# Patient Record
Sex: Female | Born: 1975
Health system: Southern US, Community
[De-identification: ages and names within clinical notes are randomized; demographics above are authoritative.]

## PROBLEM LIST (undated history)

## (undated) DIAGNOSIS — I82401 Acute embolism and thrombosis of unspecified deep veins of right lower extremity: Secondary | ICD-10-CM

## (undated) DIAGNOSIS — Z21 Asymptomatic human immunodeficiency virus [HIV] infection status: Secondary | ICD-10-CM

## (undated) DIAGNOSIS — I2699 Other pulmonary embolism without acute cor pulmonale: Secondary | ICD-10-CM

## (undated) DIAGNOSIS — Z8489 Family history of other specified conditions: Secondary | ICD-10-CM

## (undated) DIAGNOSIS — I10 Essential (primary) hypertension: Secondary | ICD-10-CM

## (undated) DIAGNOSIS — B20 Human immunodeficiency virus [HIV] disease: Secondary | ICD-10-CM

## (undated) DIAGNOSIS — A310 Pulmonary mycobacterial infection: Secondary | ICD-10-CM

## (undated) DIAGNOSIS — T4145XA Adverse effect of unspecified anesthetic, initial encounter: Secondary | ICD-10-CM

## (undated) DIAGNOSIS — S8261XA Displaced fracture of lateral malleolus of right fibula, initial encounter for closed fracture: Secondary | ICD-10-CM

## (undated) DIAGNOSIS — B3781 Candidal esophagitis: Secondary | ICD-10-CM

## (undated) DIAGNOSIS — F419 Anxiety disorder, unspecified: Secondary | ICD-10-CM

## (undated) DIAGNOSIS — M1990 Unspecified osteoarthritis, unspecified site: Secondary | ICD-10-CM

## (undated) HISTORY — DX: Candidal esophagitis: B37.81

## (undated) HISTORY — DX: Anxiety disorder, unspecified: F41.9

## (undated) HISTORY — PX: OTHER SURGICAL HISTORY: SHX169

## (undated) HISTORY — PX: ANTERIOR CRUCIATE LIGAMENT REPAIR: SHX115

## (undated) HISTORY — DX: Pulmonary mycobacterial infection: A31.0

## (undated) HISTORY — PX: TONSILLECTOMY: SUR1361

## (undated) HISTORY — DX: Asymptomatic human immunodeficiency virus (hiv) infection status: Z21

## (undated) HISTORY — DX: Human immunodeficiency virus (HIV) disease: B20

---
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
## 1997-10-07 ENCOUNTER — Other Ambulatory Visit: Admission: RE | Admit: 1997-10-07 | Discharge: 1997-10-07 | Payer: Self-pay | Admitting: Obstetrics and Gynecology

## 2000-11-16 ENCOUNTER — Encounter (INDEPENDENT_AMBULATORY_CARE_PROVIDER_SITE_OTHER): Payer: Self-pay | Admitting: Specialist

## 2000-11-16 ENCOUNTER — Other Ambulatory Visit: Admission: RE | Admit: 2000-11-16 | Discharge: 2000-11-16 | Payer: Self-pay | Admitting: Otolaryngology

## 2001-05-17 ENCOUNTER — Ambulatory Visit (HOSPITAL_BASED_OUTPATIENT_CLINIC_OR_DEPARTMENT_OTHER): Admission: RE | Admit: 2001-05-17 | Discharge: 2001-05-17 | Payer: Self-pay | Admitting: Orthopedic Surgery

## 2007-08-25 ENCOUNTER — Ambulatory Visit (HOSPITAL_BASED_OUTPATIENT_CLINIC_OR_DEPARTMENT_OTHER): Admission: RE | Admit: 2007-08-25 | Discharge: 2007-08-25 | Payer: Self-pay | Admitting: Otolaryngology

## 2007-09-10 ENCOUNTER — Ambulatory Visit: Payer: Self-pay | Admitting: Internal Medicine

## 2007-09-25 ENCOUNTER — Encounter (INDEPENDENT_AMBULATORY_CARE_PROVIDER_SITE_OTHER): Payer: Self-pay | Admitting: Otolaryngology

## 2007-09-25 ENCOUNTER — Ambulatory Visit (HOSPITAL_COMMUNITY): Admission: RE | Admit: 2007-09-25 | Discharge: 2007-09-26 | Payer: Self-pay | Admitting: Otolaryngology

## 2010-08-30 NOTE — H&P (Signed)
[name], [name]              ACCOUNT NO.:  [id number]   MEDICAL RECORD NO.:  [id number]          PATIENT TYPE:  OIB   LOCATION:  3302                         FACILITY:  [facility]   PHYSICIAN:  Hermelinda Medicus, M.D.   DATE OF BIRTH:  [birth year]   DATE OF ADMISSION:  09/25/2007  DATE OF DISCHARGE:                              HISTORY & PHYSICAL   This patient is a 35 year old female who has had sleep apnea issues.  She has had a septal reconstruction done back in O2 and did some better  on her breathing, but has a very small mouth.  Had a sleep study done  more recently and has a RDI of 29.3.  She has an O2 nadir of 84%.  She  spent 21% of her time in deep sleep, REM sleep, and has a BMI of 35.  She is a normally healthy child at 35 and comes in for turbinate  reduction, uvulopalatoplasty, and tonsillectomy.   Her past history is essentially unremarkable.  She takes no medications.  Of note, takes some Tylenol Sinus as needed and some Mucinex 600-800 mg.  Bowel, bladder, kidney, and respiratory problems are absent.  Her EKG  showed no abnormalities and she now addressed for her surgery.   PHYSICAL EXAMINATION:  Vital sings:  Her blood pressure is 115/79 and  pulse 79.  HEENT:  Her ears are clear.  Tympanic membranes are clear.  Nasal shows  considerable turbinate hypertrophy with nasal blockage but the septum is  nice and straight as it was corrected in 2002.  The oral tonsillar  hypertrophy with redundant uvula and fairly narrow, shallow nasopharynx  and oropharynx.  NECK:  Free of any thyromegaly, cervical adenopathy, or mass.  Larynx is  clear.  True cords, false cords, epiglottis, and base of the tongue are  clear.  True cord mobility, gag reflex, tongue mobility, EOMs, and  facial nerve were all symmetrical.  CHEST:  Clear to A and P.  No rales, rhonchi, or wheezes.  CARDIOVASCULAR:  Normal S1, S2.  No murmurs, rubs, or gallops.   INITIAL DIAGNOSES:  Sleep apnea with  tonsillar hypertrophy with nasal  obstruction, history of septal reconstruction and turbinate reduction.           ______________________________  Hermelinda Medicus, M.D.     JC/MEDQ  D:  09/25/2007  T:  09/26/2007  Job:  [id number]   cc:   Anna Maria, MD

## 2010-08-30 NOTE — Op Note (Signed)
[name], [name]              ACCOUNT NO.:  [id number]   MEDICAL RECORD NO.:  [id number]          PATIENT TYPE:  OIB   LOCATION:  3302                         FACILITY:  [facility]   PHYSICIAN:  Hermelinda Medicus, M.D.   DATE OF BIRTH:  [birth year]   DATE OF PROCEDURE:  DATE OF DISCHARGE:                               OPERATIVE REPORT   PREOPERATIVE DIAGNOSIS:  Sleep apnea with an RDI of 28.9, BMI of 35, O2  nadir 84%, and REM of 21.3%.   POSTOPERATIVE DIAGNOSIS:  Sleep apnea with an RDI of 28.9, BMI of 35, O2  nadir 84%, and REM of 21.3%.   OPERATION:  A turbinate reduction with a tonsillectomy and  uvulopalatoplasty.   OPERATOR:  Hermelinda Medicus, MD   ANESTHESIA:  With Dr. Catricia Piper, general endotracheal.   PROCEDURE:  The patient was placed in supine position and under general  orotracheal anesthesia, the patient was prepped and draped in the  appropriate manner.  Using the usual Hibiclens and usual head drape, 1%  Xylocaine with epinephrine was used to shrink the membranes of the  turbinates and the turbinates were reduced using the butter knife for  just blunt reduction and then the ELMED was used as a bipolar cautery to  cauterize and shrink the lateral inferior aspect of the mucous  membranes.  Once this was achieved, the Telfa was placed within the nose  and then we repositioned and oriented ourselves toward the oral cavity.  The tonsillar gag, the Vernelle Emerald was then placed, and the tonsils  were removed using the sharp, blunt, and Bovie to coagulation  dissection.  Once these tonsil were removed, we gained a considerable  amount of space and she will be much able to breathe far better.  The  uvula was also approximately 3 times as normal size and we trimmed that  to its normal size.  The closure of the anterior posterior skin was with  5-0 Ethilon.  The patient was aware of the risks and gains.  She is  aware that she could have some bleeding postoperatively from her  tonsils  and that she is going to be very careful with her food intake, bland,  soft for at least 10-12 days.  She cannot travel for 10-12 days to any  distant area.  The patient's follow up will be then in overnight.  She  will be observed on pulse oximeter and the patient will be then seen  again on Monday in 4 days and then will be followed in 2 weeks, 4 weeks,  6 weeks, 3 months, and 6 months.           ______________________________  Hermelinda Medicus, M.D.     JC/MEDQ  D:  09/25/2007  T:  09/26/2007  Job:  [id number]   cc:   Dr. Elvera Maria

## 2010-08-30 NOTE — Procedures (Signed)
NAMECASIDY, [name]              ACCOUNT NO.:  [id number]   MEDICAL RECORD NO.:  [id number]          PATIENT TYPE:  OUT   LOCATION:  SLEEP CENTER                 [facility]ACILITY[facility]  [facility]   PHYSICIAN:  Clinton D. Maple Hudson, MD, FCCP, FACPDATE OF BIRTH:  06/23/1976   DATE OF STUDY:  08/25/2007                            NOCTURNAL POLYSOMNOGRAM   REFERRING PHYSICIAN:  Hermelinda Medicus, M.D.   INDICATION FOR STUDY:  Hypersomnia with sleep apnea.    Epworth sleepiness score 11/24, BMI 35.9, weight 250 pounds, height 70  inches, neck 15 inches.   HOME MEDICATION:  Charted and reviewed.   SLEEP ARCHITECTURE:  Total sleep time 334 minutes with sleep efficiency  88.6%.  Stage 1 was 3.4%, stage 2 was 75.4%, stage 3 absent, REM 21.2%  of total sleep time.  Sleep latency 33 minutes, REM latency 49 minutes  awake after sleep onset 9 minutes, arousal index 21.9.  No bedtime  medication was taken.   RESPIRATORY DATA:  Apnea/hypopnea index (AHI) 28.9 per hour indicating  moderate obstructive sleep apnea/hypopnea syndrome.  One-hundred and  sixty-one events were scored including 3 obstructive apneas and 158  hypopneas.  Events were not positional.  REM AHI 72.3.  Diagnostic NPSG  protocol was requested and CPAP titration was not done.   OXYGEN DATA:  Moderately loud snoring with oxygen desaturation to a  nadir of 84%.  Mean oxygen saturation through the study was 94.5% on  room air.   CARDIAC DATA:  Normal sinus rhythm.   MOVEMENT/PARASOMNIA:  No significant movement disturbance.  No bathroom  trips.   IMPRESSION/RECOMMENDATION:  1. Moderate obstructive sleep apnea/hypopnea syndrome, apnea/hypopnea      index 28.9 per hour with moderately loud snoring, non-positional      events, and oxygen desaturation to a nadir of 84%.  2. She would qualify to return for continuous positive airway pressure      titration if appropriate, otherwise evaluate for alternative      therapies as indicated.      Clinton D. Maple Hudson, MD, [facility], FACP  Diplomate, Biomedical engineer of Sleep Medicine  Electronically Signed     CDY/MEDQ  D:  09/07/2007 19:03:16  T:  09/07/2007 91:47:82  Job:  [id number]

## 2010-09-02 NOTE — Op Note (Signed)
[place]  Patient:    [name], [name] Visit Number: [id number] MRN: [id number]          Service Type: DSU Location: [facility] Attending Physician:  Alinda Deem Dictated by:   Alinda Deem, M.D. Proc. Date: 05/17/01 Admit Date:  05/17/2001 Discharge Date: 05/17/2001                             Operative Report  PREOPERATIVE DIAGNOSIS:  Lateral meniscal tear versus chondromalacia of the right knee.  POSTOPERATIVE DIAGNOSIS:  Right knee grade 3 flap tears with chondromalacia of the lateral femoral condyle, two to three chondral tears to the lateral tibial condyle, multiple cartilaginous loose bodies.  OPERATION PERFORMED:  Right knee arthroscopic removal of loose bodies and debridement of chondromalacia with flap tears of the lateral compartment of the right knee.  SURGEON:  Alinda Deem, M.D.  ASSISTANT:  Dorthula Matas, P.A.-C.  ANESTHESIA:  Local with IV sedation.  ESTIMATED BLOOD LOSS:  Minimal.  FLUID REPLACEMENT:  About 800 cc of crystalloid.  TOURNIQUET TIME:  None.  INDICATIONS FOR PROCEDURE:  The patient is a 35 year old young woman who is an avid recreational basketball player with catching and clicking in her right knee with pain.  She has failed conservative treatment with anti-inflammatory medicines and activity modification.  She has also had multiple effusions and MRI scan showing chondromalacia of the lateral compartment.  She has lived with this for a number of months and now desires arthroscopic evaluation and treatment of her right knee.  Her left knee underwent anterior cruciate ligament reconstruction some years ago and has done well.  DESCRIPTION OF PROCEDURE:  The patient was identified by arm band and taken to the operating room at [facility] where the appropriate anesthetic monitors were attached and local anesthesia with intravenous sedation was induced into the right lower  extremity.  A lateral post was applied to the table and the right lower extremity prepped and draped in the usual sterile fashion from the ankle to the midthigh.  Using a #11 blade, standard inferomedial and inferolateral peripatellar portals were made allowing introduction of the arthroscope through the inferolateral portal, the outflow through the inferomedial portal and diagnostic arthroscopy did reveal multiple cartilaginous loose bodies in the right knee which were removed with the basket forceps as well as a large outflow cannula.  The donor site was identified in the lateral compartment with a stripe of grade 3 chondromalacia with flap tears which was debrided back to stable margins over about a 1 cm x 2 cm wide area as well as some fibrillation of the lateral tibial condyle, grade 2 to grade 3.  The menisci were intact.  The cruciate ligaments were intact.  The gutters were cleared.  We did find some loose bodies underneath the lateral meniscus in the recess posterolaterally and these were also removed.  We made a point of milking the posterior recess manually using a posterior pressure to assure that we had removed all the loose bodies.  At this point the joint was washed out with normal saline solution.  The arthroscopic instruments were removed.  A dressing of Xeroform, 4 x 4 dressing sponges, Webril and an Ace wrap applied.  The patient was awakened and taken to the recovery room without difficulty. Dictated by:   Alinda Deem, M.D. Attending Physician:  Alinda Deem DD:  05/20/01 TD:  05/20/01  Job: [id number]

## 2010-12-14 ENCOUNTER — Inpatient Hospital Stay (HOSPITAL_COMMUNITY)
Admission: EM | Admit: 2010-12-14 | Discharge: 2010-12-17 | DRG: 369 | Disposition: A | Discharge status: Home / Self Care | Payer: 59 | Attending: Internal Medicine | Admitting: Internal Medicine

## 2010-12-14 DIAGNOSIS — R079 Chest pain, unspecified: Secondary | ICD-10-CM | POA: Diagnosis present

## 2010-12-14 DIAGNOSIS — Z21 Asymptomatic human immunodeficiency virus [HIV] infection status: Secondary | ICD-10-CM | POA: Diagnosis present

## 2010-12-14 DIAGNOSIS — R509 Fever, unspecified: Secondary | ICD-10-CM | POA: Diagnosis present

## 2010-12-14 DIAGNOSIS — N390 Urinary tract infection, site not specified: Secondary | ICD-10-CM | POA: Diagnosis present

## 2010-12-14 DIAGNOSIS — B3781 Candidal esophagitis: Principal | ICD-10-CM | POA: Diagnosis present

## 2010-12-14 DIAGNOSIS — R1310 Dysphagia, unspecified: Secondary | ICD-10-CM | POA: Diagnosis present

## 2010-12-15 ENCOUNTER — Emergency Department (HOSPITAL_COMMUNITY): Payer: 59

## 2010-12-15 LAB — DIFFERENTIAL
Basophils Relative: 1 % (ref 0–1)
Eosinophils Absolute: 0.2 10*3/uL (ref 0.0–0.7)
Eosinophils Relative: 8 % — ABNORMAL HIGH (ref 0–5)
Monocytes Relative: 18 % — ABNORMAL HIGH (ref 3–12)
Neutrophils Relative %: 58 % (ref 43–77)

## 2010-12-15 LAB — BASIC METABOLIC PANEL
BUN: 8 mg/dL (ref 6–23)
CO2: 27 mEq/L (ref 19–32)
Calcium: 9.6 mg/dL (ref 8.4–10.5)
GFR calc non Af Amer: >60 mL/min (ref >60)
Glucose, Bld: 95 mg/dL (ref 70–99)
Potassium: 4 mEq/L (ref 3.5–5.1)
Sodium: 137 mEq/L (ref 135–145)

## 2010-12-15 LAB — URINE MICROSCOPIC-ADD ON

## 2010-12-15 LAB — URINALYSIS, ROUTINE W REFLEX MICROSCOPIC
Bilirubin Urine: NEGATIVE
Glucose, UA: NEGATIVE mg/dL
Specific Gravity, Urine: 1.025 (ref 1.005–1.030)

## 2010-12-15 LAB — CBC
MCH: 29 pg (ref 26.0–34.0)
Platelets: 224 10*3/uL (ref 150–400)
RBC: 4.21 MIL/uL (ref 3.87–5.11)
RDW: 13.6 % (ref 11.5–15.5)

## 2010-12-15 LAB — POCT PREGNANCY, URINE: Preg Test, Ur: NEGATIVE

## 2010-12-15 LAB — TECHNOLOGIST SMEAR REVIEW

## 2010-12-15 MED ORDER — IOHEXOL 300 MG/ML  SOLN
80.0000 mL | Freq: Once | INTRAMUSCULAR | Status: AC | PRN
  Administered 2010-12-15: 80 mL via INTRAVENOUS

## 2010-12-15 NOTE — H&P (Signed)
[name], [name]              ACCOUNT NO.:  [id number]  MEDICAL RECORD NO.:  [id number]  LOCATION:  WLED                         FACILITY:  [facility]  PHYSICIAN:  Gery Pray, MD      DATE OF BIRTH:  April 15, 1976  DATE OF ADMISSION:  12/14/2010 DATE OF DISCHARGE:                             HISTORY & PHYSICAL   PCP:  [facility].  CODE STATUS:  FULL CODE.  The patient goes to team 5.  CHIEF COMPLAINT:  Chest pain.  HISTORY OF PRESENT ILLNESS:  This is a 35 year old female who has developed chest discomfort approximately 2 weeks ago, it initially felt like gas.  There is no radiation to the left arm nor up the esophagus. She states that the pain has gotten progressively worse.  She finally made an appointment to see her physician at [facility] tomorrow; however, the pain is now 10/10 and she came to the ER.  She reports no nausea, no vomiting.  Pain is not afected by foods such as orange juice. She does not eat spicy her food.  She does not take any significant amount of NSAIDs.  She states she has this chest pain with swallowing, it occurs when swallowing either liquids or solids, and that is the main time she gets the pain.  She states that occasionally she feels that the food gets stuck in the esophagus.  She reports no history of GERD, and she reports no fevers, no chills, no nausea, no vomiting, no diarrhea, no abdominal pain.  In the ER, she got a Gustavus Bryant and that seemed to significantly help her discomfort.  History provided by the patient who appears reliable.  PAST MEDICAL HISTORY:  Negative.  PAST SURGICAL HISTORY:  Tonsils and adenoids.  MEDICATIONS:  None.  ALLERGIES:  No known drug allergies.  SOCIAL HISTORY:  Negative for tobacco, alcohol, or illicit drug.  FAMILY HISTORY:  Significant for hypertension.  REVIEW OF SYSTEMS:  All 10-point systems reviewed are negative except as noted in HPI.  PHYSICAL EXAMINATION:  VITAL  SIGNS:  Blood pressure 129/85, pulse of 115, respirations 20, temperature 101.5, and satting 100% on room air. GENERAL:  Alert and oriented female, currently no acute distress. EYES:  Pink conjunctivae.  PERRLA. ENT:  Moist oral mucosa.  Trachea midline.  No thyromegaly. NECK:  Supple. LUNGS:  Clear to auscultation bilaterally.  No wheezes appreciated.  No use of accessory muscles. CARDIOVASCULAR:  Regular rate and rhythm without murmurs, rubs, or gallops. ABDOMEN:  Soft, positive bowel sounds, nontender, and nondistended.  No organomegaly. NEURO:  Cranial nerves II through XII are grossly intact.  Sensation intact. MUSCULOSKELETAL:  Strength 5/5 in all extremities.  No clubbing, cyanosis, or edema.  No reproducible chest wall pain. SKIN:  No rashes.  No subcutaneous crepitations. PSYCH:  Alert and oriented appropriate female.  LABORATORY DATA:  CT of the chest shows mild proximal and mid esophageal wall thickening suggestive of esophagitis, prominent mediastinum lymph node measuring up to 1.9 cm, larger than expected for the active lymph node, and aberrant right subclavian artery, of course posterior to the esophagus.  Take out the question of this could be a possible etiology for  the patient's dysphasia.  Chest x-ray, no acute intra- cardiopulmonary process.  UA:  Nitrite negative, leukocyte esterase large, many bacteria, and 7 to 10 white blood cells.  Sodium 137, potassium 4, chloride 101, CO2 of 27, glucose 95, BUN 8, and creatinine 0.78.  White blood count 2.9, hemoglobin 12.5, and platelets are 224. EKG:  Normal sinus rhythm.  ASSESSMENT AND PLAN: 1. Dysphagia. 2. Esophagitis.  The patient will be admitted.  We will continue GR     cocktail p.r.n.  We will add Protonix, question due to that the     patient also have eosinophilic esophagitis, therefore we will order     an immunoglobulin E level, could the patient have an immune issues     that could be causing this, and we  will go ahead and order HIV.     Gastroenterology consult in the a.m. for esophagogastroduodenoscopy     as the patient could have dysphagia due to stenosis.  For the     now, we would leave the patient on an empiric liquid diet. 3. Fevers. 4. Urinary tract infection.  We will go ahead and start the patient     on empiric antibiotics Rocephin and order blood cultures.     At this point, I am doubtful that the patient is having aspiration     pneumonia.          ______________________________ Gery Pray, MD     DC/MEDQ  D:  12/15/2010  T:  12/15/2010  Job:  [id number]  Electronically Signed by Gery Pray MD on 12/15/2010 09:24:23 PM

## 2010-12-21 LAB — HIV 1/2 CONFIRMATION
HIV-1 antibody: POSITIVE
HIV-2 Ab: NEGATIVE

## 2010-12-22 LAB — CULTURE, BLOOD (ROUTINE X 2)
Culture  Setup Time: 201208310415
Culture: NO GROWTH

## 2010-12-27 NOTE — Discharge Summary (Signed)
  [name], [name]              ACCOUNT NO.:  [id number]  MEDICAL RECORD NO.:  [id number]  LOCATION:  1534                         FACILITY:  [facility]  PHYSICIAN:  Erick Blinks, MD     DATE OF BIRTH:  [birth year]  DATE OF ADMISSION:  12/14/2010 DATE OF DISCHARGE:  12/17/2010                              DISCHARGE SUMMARY   PRIMARY CARE PHYSICIAN:  Mila Palmer, M.D.  DISCHARGE DIAGNOSES: 1. Chest pain secondary to candidal esophagitis, improved. 2. HIV positive, new diagnosis, and Western blot confirmation pending.  DISCHARGE MEDICATIONS: 1. Vicodin 5/325 mg one to two tablets p.o. q.6 h p.r.n. 2. Multivitamins 1 tablet p.o. daily. 3. Aleve 220 mg 1-2 tablets p.o. q.8 h p.r.n. 4. Diflucan 100 mg p.o. daily for 3 weeks.  CONSULTATIONS:  Infectious disease doctor, Dr. Luciana Axe.  ADMISSION HISTORY:  This is a 35 year old African American female who presents to the emergency room with complaints of 2 weeks of chest discomfort with associated dysphagia.  The patient had a CT scan of the chest done which showed esophagitis.  She was subsequently admitted for further evaluation and treatment.  For details, please refer the history and physical per Dr. Joneen Roach on 08/30.  HOSPITAL COURSE:  Candidal esophagitis.  The patient was started empirically on Diflucan.  With these measures, the patient's esophagitis has significantly improved.  She will tolerate oral diet without any discomfort.  At this point, further workup included an HIV test which has come back positive as a preliminary result.  Confirmatory Western blot is currently still pending.  The patient was seen in consultation by Dr. Luciana Axe recommended a total of 3 weeks of Diflucan for esophagitis. He also recommended that the patient follow up with the Infectious Disease Clinic in 1 week's time.  We will follow-up on the results of her Western blot test.  The patient is otherwise stable and is requesting to be discharged home  at this point.  DIAGNOSTIC IMAGING:  Chest CT on admission shows mild proximal esophageal wall thickening suggestive of esophagitis.  DISCHARGE INSTRUCTIONS:  The patient should continue on a regular diet, conduct her activities tolerated.  She needs a follow up with the primary care physician in one week's time and follow up with infectious disease clinic next week to follow up on the results of her HIV test.  CONDITION AT TIME OF DISCHARGE:  Improved.     Erick Blinks, MD     JM/MEDQ  D:  12/17/2010  T:  12/18/2010  Job:  [id number]  cc:   Emeterio Reeve, MD Fax: [phone]  Electronically Signed by Erick Blinks  on 12/27/2010 01:37:32 AM

## 2010-12-29 ENCOUNTER — Other Ambulatory Visit: Payer: Self-pay | Admitting: *Deleted

## 2010-12-29 ENCOUNTER — Encounter: Payer: Self-pay | Admitting: Internal Medicine

## 2010-12-29 ENCOUNTER — Ambulatory Visit (INDEPENDENT_AMBULATORY_CARE_PROVIDER_SITE_OTHER): Payer: 59 | Admitting: Internal Medicine

## 2010-12-29 ENCOUNTER — Other Ambulatory Visit: Payer: Self-pay | Admitting: Internal Medicine

## 2010-12-29 DIAGNOSIS — Z113 Encounter for screening for infections with a predominantly sexual mode of transmission: Secondary | ICD-10-CM

## 2010-12-29 DIAGNOSIS — F419 Anxiety disorder, unspecified: Secondary | ICD-10-CM | POA: Insufficient documentation

## 2010-12-29 DIAGNOSIS — F411 Generalized anxiety disorder: Secondary | ICD-10-CM

## 2010-12-29 DIAGNOSIS — Z23 Encounter for immunization: Secondary | ICD-10-CM

## 2010-12-29 DIAGNOSIS — B20 Human immunodeficiency virus [HIV] disease: Secondary | ICD-10-CM

## 2010-12-29 LAB — HEPATITIS B SURFACE ANTIBODY,QUALITATIVE: Hep B S Ab: POSITIVE — AB

## 2010-12-29 LAB — HEPATITIS C ANTIBODY: HCV Ab: NEGATIVE

## 2010-12-29 LAB — RPR

## 2010-12-29 MED ORDER — SULFAMETHOXAZOLE-TMP DS 800-160 MG PO TABS: 1.0000 | ORAL_TABLET | Freq: Every day | ORAL | Status: DC

## 2010-12-29 MED ORDER — EMTRICITAB-RILPIVIR-TENOFOV DF 200-25-300 MG PO TABS: 1.0000 | ORAL_TABLET | Freq: Every day | ORAL | Status: DC

## 2010-12-29 MED ORDER — LIDOCAINE VISCOUS 2 % MT SOLN: 20.0000 mL | OROMUCOSAL | Status: AC | PRN

## 2010-12-29 MED ORDER — LIDOCAINE VISCOUS 2 % MT SOLN: 20.0000 mL | OROMUCOSAL | Status: DC | PRN

## 2010-12-29 MED ORDER — AZITHROMYCIN 600 MG PO TABS: 1200.0000 mg | ORAL_TABLET | ORAL | Status: DC

## 2010-12-29 NOTE — Progress Notes (Signed)
  Subjective:    Patient ID: [name], female    DOB: 08-08-1975, 35 y.o.   MRN: [id number]  HPI she comes in for her first visit to this clinic for a recent diagnosis of 042. She was recently hospitalized when it was noted she had significant chest pain and a CAT scan showed significant esophagitis. She was empirically treated with fluconazole and had some improvement and was discharged. She comes today for her first visit to consider antiretroviral therapy. Her CD4 T-cell count is significantly depressed at 10. She tells me she has had recent anxiety. She does have continued swallowing difficulty.  She denies any shortness of breath or significant weight loss though still has difficulty. Her interview was done with her partner who was with her and is aware of her diagnosis.    Review of Systems  Constitutional: Positive for appetite change. Negative for fever, fatigue and unexpected weight change.  HENT: Negative.   Eyes: Negative.   Respiratory: Negative.   Cardiovascular: Negative.   Gastrointestinal: Negative.   Genitourinary: Negative.   Musculoskeletal: Negative.   Skin: Negative.   Neurological: Negative.   Hematological: Negative.   Psychiatric/Behavioral: Negative.        Objective:   Physical Exam  Constitutional: She is oriented to person, place, and time. She appears well-developed and well-nourished.  HENT:  Mouth/Throat: Oropharynx is clear and moist. No oropharyngeal exudate.  Eyes: No scleral icterus.  Cardiovascular: Normal rate, regular rhythm and normal heart sounds.   No murmur heard. Pulmonary/Chest: Effort normal and breath sounds normal. No respiratory distress. She has no wheezes.  Abdominal: Soft. Bowel sounds are normal. There is no tenderness.  Lymphadenopathy:    She has no cervical adenopathy.  Neurological: She is alert and oriented to person, place, and time.  Skin: Skin is warm and dry. No erythema.  Psychiatric: She has a normal mood and  affect. Her behavior is normal.          Assessment & Plan:

## 2010-12-29 NOTE — Assessment & Plan Note (Signed)
The patient was tearful after a left. She did tell me she has a lot of anxiety particularly since her diagnosis. She is seeing her primary physician tomorrow and I encouraged her to get counseling. She will discuss with her primary physician tomorrow for further options for care.

## 2010-12-29 NOTE — Assessment & Plan Note (Addendum)
Patient tells me she is eager to start therapy and all questions were answered. I discussed the different options and I will start her with Complera. I discussed the side effects and need to take it with food. I also will start her on prophylaxis with weekly azithromycin along with Bactrim daily. I discussed the issues with low immune system with her very low CD4 T cell count. She will return in approximately 6 weeks to recheck her labs to assure that it is improving. Today also she is getting baseline labs including an AFB fungal culture, genotype and hepatitis studies. I discussed with her as well prevention with condom use and protection with her current female partner.  I discussed with her the need for a Pap smear and she will get that from her primary physician.

## 2010-12-30 LAB — T-HELPER CELL (CD4) - (RCID CLINIC ONLY)
CD4 % Helper T Cell: 3 % — ABNORMAL LOW (ref 33–55)
CD4 T Cell Abs: 10 uL — ABNORMAL LOW (ref 400–2700)

## 2010-12-30 LAB — HIV-1 RNA ULTRAQUANT REFLEX TO GENTYP+
HIV 1 RNA Quant: 143000 copies/mL — ABNORMAL HIGH (ref <20)
HIV-1 RNA Quant, Log: 5.16 {Log} — ABNORMAL HIGH (ref <1.30)

## 2011-01-02 LAB — HIV-1 GENOTYPR PLUS

## 2011-01-03 LAB — HIV-1 GENOTYPR PLUS

## 2011-01-09 ENCOUNTER — Telehealth: Payer: Self-pay | Admitting: *Deleted

## 2011-01-09 NOTE — Telephone Encounter (Signed)
States she wants to use zyrtec for head congestion. Other md put her on nexium. She wanted to make sure this would not conflict with her hiv med. Told her unlikely but to always get her meds at the same drug store & check with the pharmacist. The pharmacist may tell her when to take certain meds to avoid problems. Told her they usually run new mds & a program checks for possible interactions. If any are found, they call or fax the md here

## 2011-01-12 ENCOUNTER — Telehealth: Payer: Self-pay | Admitting: *Deleted

## 2011-01-12 LAB — URINALYSIS, ROUTINE W REFLEX MICROSCOPIC
Bilirubin Urine: NEGATIVE
Glucose, UA: NEGATIVE
Hgb urine dipstick: NEGATIVE
Ketones, ur: NEGATIVE
Nitrite: NEGATIVE
Protein, ur: NEGATIVE
Specific Gravity, Urine: 1.022
Urobilinogen, UA: 0.2
pH: 7

## 2011-01-12 LAB — CBC
HCT: 44
Hemoglobin: 14.9
MCHC: 33.9
MCV: 88.7
Platelets: 254
RBC: 4.96
RDW: 12.9
WBC: 6.3

## 2011-01-12 NOTE — Telephone Encounter (Signed)
Donnella Sham with the [facility] Department called to check if patient made her initial visit and made a follow-up visit. Gave her the information she needed.

## 2011-01-12 NOTE — Consult Note (Signed)
[name], [name] NO.:  [id number]  MEDICAL RECORD NO.:  [id number]  LOCATION:  1534                         FACILITY:  [facility]  PHYSICIAN:  Gardiner Barefoot, MD    DATE OF BIRTH:  09-24-1975  DATE OF CONSULTATION: DATE OF DISCHARGE:                                CONSULTATION   PRIMARY CARE PHYSICIAN:  [facility].  CHIEF COMPLAINT:  Chest pain and esophagitis.  REASON FOR CONSULTATION:  New HIV diagnosis.  HISTORY OF PRESENT ILLNESS:  This is a 35 year old female who has had about 2 weeks of chest discomfort, felt initially like gas, but had some progressive symptoms and came to the emergency room for evaluation.  She had dysphagia and it was associated with both liquids and solids. Workup did show esophagitis on a CAT scan.  She was admitted for further intervention.  The patient has had no recent fever and no significant weight loss.  She was admitted for evaluation and HIV test was done which is ELISA positive with a Western blot pending.  Consultation was called to discuss these findings with the patient.  PAST MEDICAL HISTORY:  History of tonsil and adenoidectomy.  MEDICATIONS:  None.  ALLERGIES:  No known drug allergies.  SOCIAL HISTORY:  Denies alcohol, tobacco, or drug use.  FAMILY HISTORY:  There is hypertension in the family.  REVIEW OF SYSTEMS:  A 12-point review of systems was obtained and is negative except as per the history of present illness.  PHYSICAL EXAM:  VITAL SIGNS:  Temperature is 98.2, pulse is 83, respirations 18, blood pressure 145/93, O2 sat 96% on room air. GENERAL:  The patient is awake, alert, and oriented x3, and appears in no acute distress. CARDIOVASCULAR:  Regular rate and rhythm.  No murmurs, rubs, or gallops. LUNGS:  Clear to auscultation bilaterally. ABDOMEN:  Soft, nontender, nondistended.  Positive bowel sounds.  No hepatosplenomegaly. EXTREMITIES:  No cyanosis, clubbing, edema.  LABS:  HIV  ELISA is positive, IgE is 2541, WBC is 2.9 with 58% neutrophils, creatinine 0.78.  CT scan shows esophagitis and mediastinal lymph nodes.  ASSESSMENT AND PLAN: 1. Esophagitis.  The patient was started on fluconazole empirically     and she does have improvement.  This is likely secondary to     Candidal esophagitis.  She should continue 21-day course. 2. Human immunodeficiency virus.  We are awaiting the confirmation     testing with Western blot and viral load has been sent.  I     discussed with the patient these findings that in light of the     esophagitis as well as positive ELISA, most likely is true     positive; however, we will await the confirmation to be sure.  I     did tell her that this will probably be back next week and she can     followup with [place] in our clinic to discuss the final results.     Information was given for followup with the clinic and she will be     contacted for an appointment and she was also given the information     on how to call and make  an appointment if she does not hear from     [place].  Thank you for the consult.  Please call if there are any further issues that arise prior to her discharge.     Gardiner Barefoot, MD     RWC/MEDQ  D:  12/16/2010  T:  12/17/2010  Job:  [id number]  Electronically Signed by Staci Righter MD on 01/12/2011 09:01:23 AM

## 2011-01-19 ENCOUNTER — Telehealth: Payer: Self-pay | Admitting: *Deleted

## 2011-01-19 ENCOUNTER — Ambulatory Visit: Payer: 59 | Admitting: Internal Medicine

## 2011-01-19 NOTE — Telephone Encounter (Signed)
Patient returned call placed to her. She says that she would take the ompeprazole and protonic in place of nexium. And she wants to keep taking the complara since she has started it and she is not having any problems. Advised the patient will forward this to her provider and get back to her asap.  PLEASE ADVISE of drug and dose.

## 2011-01-19 NOTE — Telephone Encounter (Signed)
Called patient because we got an alert from her pharmacy that she can not take complera and nexium. The provider wants to know if she wants to change the nexium or the complera. But I had to leave a message for her.

## 2011-01-20 NOTE — Telephone Encounter (Signed)
Unfortunately it is the class of medicine (ppi) so does not matter if it is Nexium or another brand.  She can switch to Stribild which is also one pill daily and has two of the same active medicines and she can just switch the next day.  The problem with the Complera is that it may not work with Nexium, protonix or others.     If she wants Stribild, it will need to be called in since it can't yet be ordered on the computer or to just stop her antireflux medicine.  Thanks

## 2011-01-20 NOTE — Telephone Encounter (Signed)
Spoke with patient and explained that she can not take any PPIs with Complera. And explained to her about certain foods that can increase her acid reflux.  She is going to stay on the Complera and see if she can manage her acid reflux with change in diet.  If it becomes too difficult she will call and consider changing to Stribild. Wendall Mola CMA

## 2011-01-20 NOTE — Telephone Encounter (Signed)
This note opened in error.

## 2011-01-25 ENCOUNTER — Telehealth: Payer: Self-pay | Admitting: *Deleted

## 2011-01-25 NOTE — Telephone Encounter (Signed)
[facility] called about culture results on patient, showed mycobacterium avium.  They will fax the results and will be given to Dr. Luciana Axe. Wendall Mola CMA

## 2011-01-26 ENCOUNTER — Telehealth: Payer: Self-pay | Admitting: *Deleted

## 2011-01-26 ENCOUNTER — Other Ambulatory Visit: Payer: Self-pay | Admitting: Internal Medicine

## 2011-01-26 MED ORDER — RIFABUTIN 150 MG PO CAPS: 300.0000 mg | ORAL_CAPSULE | Freq: Every day | ORAL | Status: DC

## 2011-01-26 MED ORDER — ETHAMBUTOL HCL 400 MG PO TABS: 1600.0000 mg | ORAL_TABLET | Freq: Every day | ORAL | Status: DC

## 2011-01-26 MED ORDER — AZITHROMYCIN 600 MG PO TABS: 600.0000 mg | ORAL_TABLET | Freq: Every day | ORAL | Status: DC

## 2011-01-26 NOTE — Telephone Encounter (Signed)
Patient prescribed antibiotics by Dr. Luciana Axe and these were sent to [facility].  Patient will pick up a 30 day supply and then may want them sent to a mail order.  If she decides this, will have mail order fax a request. Wendall Mola CMA

## 2011-01-26 NOTE — Telephone Encounter (Signed)
Pt's friend called to ask about her new meds. I asked to speak with pt. She did not know what the meds were. I read them to her & told her they had been sent. She already knew why she was on them. She asked that Annice Pih, CMA call Sameidra back at [phone]. Said she still wanted to speak with Annice Pih. Message sent

## 2011-02-03 ENCOUNTER — Telehealth: Payer: Self-pay | Admitting: *Deleted

## 2011-02-03 DIAGNOSIS — L299 Pruritus, unspecified: Secondary | ICD-10-CM

## 2011-02-03 NOTE — Telephone Encounter (Signed)
Pt requesting rx for itching.  Dr. Luciana Axe asked her to call back and request rx if itching continued.  Pt uses [facility] at the corner of [address]   Text page sent to Dr. Luciana Axe.

## 2011-02-07 NOTE — Telephone Encounter (Signed)
States she has not heard back about a med for itching. Wants something called in asap. Told her I will send the message again.

## 2011-02-08 MED ORDER — HYDROXYZINE HCL 50 MG PO TABS: 50.0000 mg | ORAL_TABLET | Freq: Four times a day (QID) | ORAL | Status: AC | PRN

## 2011-02-08 NOTE — Telephone Encounter (Signed)
Try hydroxyzine, 50 mg po q 6 hours prn itch.

## 2011-02-16 ENCOUNTER — Other Ambulatory Visit (INDEPENDENT_AMBULATORY_CARE_PROVIDER_SITE_OTHER): Payer: 59

## 2011-02-16 ENCOUNTER — Other Ambulatory Visit: Payer: Self-pay | Admitting: Infectious Diseases

## 2011-02-16 DIAGNOSIS — B20 Human immunodeficiency virus [HIV] disease: Secondary | ICD-10-CM

## 2011-02-16 LAB — CBC WITH DIFFERENTIAL/PLATELET
Basophils Absolute: 0 10*3/uL (ref 0.0–0.1)
Basophils Relative: 1 % (ref 0–1)
Eosinophils Absolute: 0.7 10*3/uL (ref 0.0–0.7)
HCT: 36 % (ref 36.0–46.0)
MCH: 28.9 pg (ref 26.0–34.0)
MCHC: 33.6 g/dL (ref 30.0–36.0)
Monocytes Absolute: 0.5 10*3/uL (ref 0.1–1.0)
Monocytes Relative: 10 % (ref 3–12)
Neutro Abs: 1.9 10*3/uL (ref 1.7–7.7)
Neutrophils Relative %: 45 % (ref 43–77)
RDW: 16.9 % — ABNORMAL HIGH (ref 11.5–15.5)

## 2011-02-16 LAB — COMPLETE METABOLIC PANEL WITH GFR
ALT: 25 U/L (ref 0–35)
Albumin: 4 g/dL (ref 3.5–5.2)
CO2: 27 mEq/L (ref 19–32)
Chloride: 103 mEq/L (ref 96–112)
GFR, Est African American: >89 mL/min (ref >90)
GFR, Est Non African American: 78 mL/min — ABNORMAL LOW (ref >90)
Glucose, Bld: 88 mg/dL (ref 70–99)
Potassium: 4.7 mEq/L (ref 3.5–5.3)
Sodium: 137 mEq/L (ref 135–145)
Total Protein: 7.3 g/dL (ref 6.0–8.3)

## 2011-02-17 LAB — T-HELPER CELL (CD4) - (RCID CLINIC ONLY): CD4 % Helper T Cell: 4 % — ABNORMAL LOW (ref 33–55)

## 2011-02-20 LAB — AFB CULTURE, BLOOD

## 2011-02-21 ENCOUNTER — Other Ambulatory Visit: Payer: Self-pay | Admitting: Licensed Clinical Social Worker

## 2011-02-21 DIAGNOSIS — B20 Human immunodeficiency virus [HIV] disease: Secondary | ICD-10-CM

## 2011-02-21 MED ORDER — AZITHROMYCIN 600 MG PO TABS: 600.0000 mg | ORAL_TABLET | Freq: Every day | ORAL | Status: AC

## 2011-02-21 MED ORDER — EMTRICITAB-RILPIVIR-TENOFOV DF 200-25-300 MG PO TABS: 1.0000 | ORAL_TABLET | Freq: Every day | ORAL | Status: DC

## 2011-02-21 MED ORDER — ETHAMBUTOL HCL 400 MG PO TABS: 1600.0000 mg | ORAL_TABLET | Freq: Every day | ORAL | Status: DC

## 2011-02-23 LAB — HIV-1 RNA QUANT-NO REFLEX-BLD: HIV-1 RNA Quant, Log: 4.24 {Log} — ABNORMAL HIGH (ref <1.30)

## 2011-03-02 ENCOUNTER — Encounter: Payer: Self-pay | Admitting: Internal Medicine

## 2011-03-02 ENCOUNTER — Ambulatory Visit (INDEPENDENT_AMBULATORY_CARE_PROVIDER_SITE_OTHER): Payer: 59 | Admitting: Internal Medicine

## 2011-03-02 ENCOUNTER — Other Ambulatory Visit: Payer: Self-pay | Admitting: *Deleted

## 2011-03-02 VITALS — BP 133/84 | HR 93 | Temp 98.2°F | Ht 70.0 in | Wt 250.0 lb

## 2011-03-02 DIAGNOSIS — A319 Mycobacterial infection, unspecified: Secondary | ICD-10-CM | POA: Insufficient documentation

## 2011-03-02 DIAGNOSIS — B20 Human immunodeficiency virus [HIV] disease: Secondary | ICD-10-CM

## 2011-03-02 MED ORDER — SULFAMETHOXAZOLE-TMP DS 800-160 MG PO TABS: 1.0000 | ORAL_TABLET | Freq: Every day | ORAL | Status: DC

## 2011-03-02 NOTE — Patient Instructions (Signed)
Repeat labs in about 3 weeks.

## 2011-03-02 NOTE — Assessment & Plan Note (Signed)
She has been started on appropriate therapy for mycobacterial avium in the blood. She has no significant symptoms so combined therapy with azithromycin and ethambutol as appropriate. She has no vision changes at this time or complaints. She is tolerating the medicine well including no GI upset either. I will keep her on this therapy for an extended period and I did discuss this with her and her partner who was in the room with her.

## 2011-03-02 NOTE — Assessment & Plan Note (Signed)
The patient's CD4 count has increased from 10-50 and her viral load has decreased from 140,000-17,000. A complete response over a we'll keep a close eye on her at CD4 and viral load and recheck in about 3 weeks to assure that is continuing to decrease. If the repeat in a few weeks is stable, I will see the patient back in January but if it is not significantly decreased and we'll have her back sooner to consider changing the therapy. I did discuss that Pap smears which she does get from an outside source and condom use with any heterosexual sexual activity. With the patient's itching, I do think this is partly related to the reconstitution of her immune system. She otherwise has no significant issues at this time.

## 2011-03-02 NOTE — Progress Notes (Signed)
  Subjective:    Patient ID: [name], female    DOB: 1975/05/16, 35 y.o.   MRN: [id number]  HPI This patient comes in for routine followup. She was recently started on Complera and has been taking it every day. She does report 100% compliance and good tolerance of medications. Her other issues include significant itching which predates the start of Complera, she also has been diagnosed recently with mycobacterial avium in the blood, though has been asymptomatic. She has therefore been started on ethambutol and azithromycin. Her other issue is difficulty sleeping at night despite E. Atarax and Benadryl. She otherwise has no complaints today and is tolerating her medicine well. She did stop her Nexium so that she can continue the Complera and has had no problems with acid reflux. She otherwise states that she does continue get it to get her Pap smears from her primary gynecologist.   Review of Systems  Constitutional: Negative for fever, chills, activity change, appetite change and unexpected weight change.  HENT: Negative for trouble swallowing.   Respiratory: Negative for cough and shortness of breath.   Cardiovascular: Negative for leg swelling.  Gastrointestinal: Negative for nausea, abdominal pain, diarrhea and constipation.  Genitourinary: Negative for dysuria and frequency.  Skin: Positive for rash.  Neurological: Negative for headaches.  Hematological: Negative for adenopathy.  Psychiatric/Behavioral: Positive for sleep disturbance. Negative for dysphoric mood.       Objective:   Physical Exam  Constitutional: She is oriented to person, place, and time. She appears well-developed and well-nourished. No distress.  HENT:  Mouth/Throat: Oropharynx is clear and moist. No oropharyngeal exudate.  Eyes: No scleral icterus.  Cardiovascular: Normal rate, regular rhythm and normal heart sounds.   No murmur heard. Pulmonary/Chest: Effort normal and breath sounds normal. She has no  wheezes. She has no rales.  Abdominal: Soft. Bowel sounds are normal. There is no tenderness. There is no rebound.  Lymphadenopathy:    She has no cervical adenopathy.  Neurological: She is alert and oriented to person, place, and time.  Skin: Skin is warm. Rash noted.       Facial rash, sees dermatology  Psychiatric: She has a normal mood and affect. Her behavior is normal.          Assessment & Plan:

## 2011-04-04 ENCOUNTER — Other Ambulatory Visit: Payer: 59

## 2011-04-04 ENCOUNTER — Other Ambulatory Visit: Payer: Self-pay | Admitting: Internal Medicine

## 2011-04-04 DIAGNOSIS — B20 Human immunodeficiency virus [HIV] disease: Secondary | ICD-10-CM

## 2011-04-05 LAB — T-HELPER CELL (CD4) - (RCID CLINIC ONLY)
CD4 % Helper T Cell: 5 % — ABNORMAL LOW (ref 33–55)
CD4 T Cell Abs: 50 uL — ABNORMAL LOW (ref 400–2700)

## 2011-04-06 LAB — HIV-1 RNA QUANT-NO REFLEX-BLD
HIV 1 RNA Quant: 158000 copies/mL — ABNORMAL HIGH (ref <20)
HIV-1 RNA Quant, Log: 5.2 {Log} — ABNORMAL HIGH (ref <1.30)

## 2011-05-02 ENCOUNTER — Encounter: Payer: Self-pay | Admitting: Internal Medicine

## 2011-05-02 ENCOUNTER — Ambulatory Visit (INDEPENDENT_AMBULATORY_CARE_PROVIDER_SITE_OTHER): Payer: 59 | Admitting: Internal Medicine

## 2011-05-02 ENCOUNTER — Telehealth: Payer: Self-pay | Admitting: *Deleted

## 2011-05-02 VITALS — BP 131/85 | HR 99 | Temp 98.2°F | Ht 70.0 in | Wt 261.0 lb

## 2011-05-02 DIAGNOSIS — B20 Human immunodeficiency virus [HIV] disease: Secondary | ICD-10-CM

## 2011-05-02 NOTE — Assessment & Plan Note (Signed)
She unfortunately has detectable virus and in fact is at its baseline level suggesting non-compliance.  Her CD4 also has not increased at all.  I confirmed with her as did the nurse that she is indeed taking it daily.  She denies any missed doses.  I am a bit concerned with her confusion with the medications and therefore what I have opted to do is to simplify.  I will have her continue the Complera sdaily and the bactrim daily.  I have had her stop the azithromycin and ethambutol for now.  I will recheck a viral load with genotype next week and scan for resistance.  I also will recheck her AFB blood culture.  She has been on the medication for MAI for 3 months now, which was not disseminated disease.  I will then follow up with her closely once those results come back.

## 2011-05-02 NOTE — Progress Notes (Signed)
  Subjective:    Patient ID: [name], female    DOB: 1975/09/19, 36 y.o.   MRN: [id number]  HPI she comes in for follow up of her 042 and positive MAI culture.  She started on ART in September of 2012 and after her AFB culture was positive started on Ethambutol and Azithromycin in October 2012.  She tells me she has had excellent compliance and tolerance of the medications.  After further discussion though, it appears that she is somewhat confused on the medications and is taking Bactrim twice a day and 1200 of azithromycin daily instead of 600mg .  She does though state she takes complera faithfully and does not take any reflux or antiacid medications at all.      Review of Systems  Constitutional: Negative for fever, chills, appetite change, fatigue and unexpected weight change.  HENT: Negative for sore throat and trouble swallowing.   Respiratory: Negative for cough and shortness of breath.   Cardiovascular: Negative for chest pain, palpitations and leg swelling.  Gastrointestinal: Negative for nausea, abdominal pain and diarrhea.  Genitourinary: Negative for genital sores and pelvic pain.  Musculoskeletal: Negative for myalgias and arthralgias.  Skin: Negative for rash.       + pruritis  Neurological: Negative for dizziness, light-headedness and headaches.  Hematological: Negative for adenopathy.  Psychiatric/Behavioral: Negative for dysphoric mood. The patient is not nervous/anxious.        Objective:   Physical Exam  Constitutional: She appears well-developed and well-nourished. No distress.  HENT:  Mouth/Throat: Oropharynx is clear and moist. No oropharyngeal exudate.  Cardiovascular: Normal rate, regular rhythm and normal heart sounds.  Exam reveals no gallop and no friction rub.   No murmur heard. Pulmonary/Chest: Effort normal and breath sounds normal. No respiratory distress. She has no wheezes. She has no rales.  Abdominal: Soft. Bowel sounds are normal. She exhibits  no distension. There is no tenderness.  Lymphadenopathy:    She has no cervical adenopathy.  Neurological: She is alert.  Skin: Skin is warm and dry. No rash noted. No erythema.  Psychiatric: She has a normal mood and affect. Her behavior is normal.          Assessment & Plan:

## 2011-05-02 NOTE — Telephone Encounter (Signed)
Called and left patient a message.  Dr. Luciana Axe wants her to come back in 1 week for labs instead of 3 weeks. Wendall Mola CMA

## 2011-05-05 ENCOUNTER — Telehealth: Payer: Self-pay | Admitting: *Deleted

## 2011-05-05 NOTE — Telephone Encounter (Signed)
Ms. [name] stated someone called them. Wants to know what it is about. I did not see anything in chart re: a call. She asked when her next appt was. Pt is at work. I told her I was unable to discuss pt. She states she has signed something that allows [place] to speak about her partner. I apologized but told her I could not discuss. Asked her at next appt see md and we could put her name on the front page of the chart for all to see & then we would not have to tell her we could not share pt info. She was ok with this

## 2011-05-10 ENCOUNTER — Other Ambulatory Visit (INDEPENDENT_AMBULATORY_CARE_PROVIDER_SITE_OTHER): Payer: 59

## 2011-05-10 ENCOUNTER — Other Ambulatory Visit: Payer: Self-pay | Admitting: Internal Medicine

## 2011-05-10 ENCOUNTER — Telehealth: Payer: Self-pay | Admitting: *Deleted

## 2011-05-10 DIAGNOSIS — B20 Human immunodeficiency virus [HIV] disease: Secondary | ICD-10-CM

## 2011-05-10 NOTE — Telephone Encounter (Signed)
No, just those two.  Thanks

## 2011-05-10 NOTE — Telephone Encounter (Signed)
Does this patient need any other lab ordered.  There is an order for viral load to genotype and CD4 Wendall Mola CMA

## 2011-05-11 LAB — T-HELPER CELL (CD4) - (RCID CLINIC ONLY): CD4 T Cell Abs: 40 uL — ABNORMAL LOW (ref 400–2700)

## 2011-05-12 LAB — HIV-1 RNA ULTRAQUANT REFLEX TO GENTYP+: HIV 1 RNA Quant: 341255 copies/mL — ABNORMAL HIGH (ref <20)

## 2011-05-23 ENCOUNTER — Other Ambulatory Visit: Payer: 59

## 2011-05-23 LAB — HIV-1 GENOTYPR PLUS

## 2011-05-25 ENCOUNTER — Other Ambulatory Visit: Payer: Self-pay | Admitting: *Deleted

## 2011-05-25 ENCOUNTER — Telehealth: Payer: Self-pay | Admitting: *Deleted

## 2011-05-25 NOTE — Telephone Encounter (Signed)
Called and notified patient to stop the Complera due to resistance and moved her appointment up until 06/06/11. Wendall Mola CMA

## 2011-06-06 ENCOUNTER — Encounter: Payer: Self-pay | Admitting: Internal Medicine

## 2011-06-06 ENCOUNTER — Ambulatory Visit (INDEPENDENT_AMBULATORY_CARE_PROVIDER_SITE_OTHER): Payer: 59 | Admitting: Internal Medicine

## 2011-06-06 VITALS — BP 131/89 | HR 82 | Temp 98.0°F | Wt 264.0 lb

## 2011-06-06 DIAGNOSIS — B20 Human immunodeficiency virus [HIV] disease: Secondary | ICD-10-CM

## 2011-06-06 DIAGNOSIS — A319 Mycobacterial infection, unspecified: Secondary | ICD-10-CM

## 2011-06-06 MED ORDER — EMTRICITABINE-TENOFOVIR DF 200-300 MG PO TABS: 1.0000 | ORAL_TABLET | Freq: Every day | ORAL | Status: DC

## 2011-06-06 MED ORDER — RITONAVIR 100 MG PO TABS: 100.0000 mg | ORAL_TABLET | Freq: Every day | ORAL | Status: DC

## 2011-06-06 MED ORDER — DARUNAVIR ETHANOLATE 800 MG PO TABS: 800.0000 mg | ORAL_TABLET | Freq: Every day | ORAL | Status: DC

## 2011-06-06 MED ORDER — RALTEGRAVIR POTASSIUM 400 MG PO TABS: 400.0000 mg | ORAL_TABLET | Freq: Two times a day (BID) | ORAL | Status: DC

## 2011-06-06 NOTE — Patient Instructions (Signed)
Take 1200 mg Azithromycin weekly, Bactrim 1 tab daily

## 2011-06-07 ENCOUNTER — Encounter: Payer: Self-pay | Admitting: Internal Medicine

## 2011-06-07 NOTE — Assessment & Plan Note (Signed)
I have rechecked an AFB blood culture.  I have had her restart prophylaxis weekly and will follow.  She has been asymptomatic and at this time no indication for further treatment.

## 2011-06-07 NOTE — Assessment & Plan Note (Signed)
I have started her on Isentress, Truvada and boosted Prezista.  I emphasized strict adherence and she voiced her understanding.  She will start and have repeat labs in about 3 weeks and return to see me about 1-2 weeks later.

## 2011-06-07 NOTE — Progress Notes (Signed)
  Subjective:    Patient ID: [name], female    DOB: Jul 07, 1975, 36 y.o.   MRN: [id number]  HPI here for follow up of her HIV and now multi drug resistant virus.  She has been on Complera but now has several resistant mutations eliminating NNRTIs and a 184V.  She otherwise has been well and is at the visit with her partner who is aware of the diagnosis and is active in her care.      Review of Systems  Constitutional: Negative for fever, chills, fatigue and unexpected weight change.  HENT: Negative for sore throat and trouble swallowing.   Respiratory: Negative for cough, shortness of breath and wheezing.   Cardiovascular: Negative for chest pain, palpitations and leg swelling.  Gastrointestinal: Negative for nausea, abdominal pain and diarrhea.  Musculoskeletal: Negative for myalgias and arthralgias.  Skin: Negative for pallor and rash.  Neurological: Negative for dizziness, weakness and headaches.  Psychiatric/Behavioral: Negative for dysphoric mood. The patient is not nervous/anxious.        Objective:   Physical Exam  Constitutional: She appears well-developed and well-nourished. No distress.  HENT:  Mouth/Throat: Oropharynx is clear and moist. No oropharyngeal exudate.  Cardiovascular: Normal rate, regular rhythm and normal heart sounds.  Exam reveals no gallop and no friction rub.   No murmur heard. Pulmonary/Chest: Effort normal and breath sounds normal. No respiratory distress. She has no wheezes. She has no rales.  Abdominal: Soft. Bowel sounds are normal. She exhibits no distension. There is no tenderness. There is no rebound.  Lymphadenopathy:    She has no cervical adenopathy.  Skin: Skin is warm and dry. No rash noted. No erythema.          Assessment & Plan:

## 2011-06-08 ENCOUNTER — Ambulatory Visit: Payer: 59 | Admitting: Internal Medicine

## 2011-06-18 LAB — AFB CULTURE, BLOOD

## 2011-06-29 ENCOUNTER — Other Ambulatory Visit: Payer: 59

## 2011-06-29 DIAGNOSIS — B20 Human immunodeficiency virus [HIV] disease: Secondary | ICD-10-CM

## 2011-06-29 LAB — CBC WITH DIFFERENTIAL/PLATELET
Eosinophils Absolute: 0.4 10*3/uL (ref 0.0–0.7)
Eosinophils Relative: 8 % — ABNORMAL HIGH (ref 0–5)
Hemoglobin: 13.8 g/dL (ref 12.0–15.0)
Lymphocytes Relative: 21 % (ref 12–46)
Lymphs Abs: 1.1 10*3/uL (ref 0.7–4.0)
MCH: 29.7 pg (ref 26.0–34.0)
MCV: 88.2 fL (ref 78.0–100.0)
Monocytes Relative: 11 % (ref 3–12)
Neutrophils Relative %: 59 % (ref 43–77)
RBC: 4.65 MIL/uL (ref 3.87–5.11)
WBC: 5.1 10*3/uL (ref 4.0–10.5)

## 2011-06-29 LAB — COMPREHENSIVE METABOLIC PANEL
ALT: 16 U/L (ref 0–35)
Albumin: 4.4 g/dL (ref 3.5–5.2)
CO2: 26 mEq/L (ref 19–32)
Calcium: 9.7 mg/dL (ref 8.4–10.5)
Chloride: 102 mEq/L (ref 96–112)
Glucose, Bld: 85 mg/dL (ref 70–99)
Sodium: 136 mEq/L (ref 135–145)
Total Bilirubin: 0.4 mg/dL (ref 0.3–1.2)
Total Protein: 7.2 g/dL (ref 6.0–8.3)

## 2011-06-30 LAB — T-HELPER CELL (CD4) - (RCID CLINIC ONLY)
CD4 % Helper T Cell: 7 % — ABNORMAL LOW (ref 33–55)
CD4 T Cell Abs: 80 uL — ABNORMAL LOW (ref 400–2700)

## 2011-07-06 ENCOUNTER — Other Ambulatory Visit: Payer: Self-pay | Admitting: Licensed Clinical Social Worker

## 2011-07-06 ENCOUNTER — Encounter: Payer: Self-pay | Admitting: Internal Medicine

## 2011-07-06 ENCOUNTER — Ambulatory Visit (INDEPENDENT_AMBULATORY_CARE_PROVIDER_SITE_OTHER): Payer: 59 | Admitting: Internal Medicine

## 2011-07-06 VITALS — BP 129/85 | HR 87 | Temp 98.5°F | Ht 70.0 in | Wt 265.0 lb

## 2011-07-06 DIAGNOSIS — B20 Human immunodeficiency virus [HIV] disease: Secondary | ICD-10-CM

## 2011-07-06 DIAGNOSIS — A319 Mycobacterial infection, unspecified: Secondary | ICD-10-CM

## 2011-07-06 MED ORDER — SULFAMETHOXAZOLE-TMP DS 800-160 MG PO TABS: 1.0000 | ORAL_TABLET | Freq: Every day | ORAL | Status: DC

## 2011-07-06 MED ORDER — AZITHROMYCIN 600 MG PO TABS: 1200.0000 mg | ORAL_TABLET | ORAL | Status: DC

## 2011-07-06 MED ORDER — RALTEGRAVIR POTASSIUM 400 MG PO TABS: 400.0000 mg | ORAL_TABLET | Freq: Two times a day (BID) | ORAL | Status: DC

## 2011-07-06 MED ORDER — DARUNAVIR ETHANOLATE 800 MG PO TABS: 800.0000 mg | ORAL_TABLET | Freq: Every day | ORAL | Status: DC

## 2011-07-06 MED ORDER — EMTRICITABINE-TENOFOVIR DF 200-300 MG PO TABS: 1.0000 | ORAL_TABLET | Freq: Every day | ORAL | Status: DC

## 2011-07-06 MED ORDER — RITONAVIR 100 MG PO TABS: 100.0000 mg | ORAL_TABLET | Freq: Every day | ORAL | Status: DC

## 2011-07-07 LAB — HIV-1 GENOTYPR PLUS

## 2011-07-09 NOTE — Assessment & Plan Note (Signed)
Doing much better now on this regimen. I think the itching has likely resolved with improved viral control.  She will return in about 6 weeks to recheck.

## 2011-07-09 NOTE — Assessment & Plan Note (Signed)
Repeat is negative and she has always been asymptomatic.  At this point, I do not feel restarting treatment is indicated.

## 2011-07-09 NOTE — Progress Notes (Signed)
  Subjective:    Patient ID: [name], female    DOB: Apr 15, 1976, 36 y.o.   MRN: [id number]  HPI She is here for follow up of her HIV and multi drug resistant virus. She was on Complera but now has several resistant mutations eliminating NNRTIs and a 184V. She otherwise has been well and is at the visit with her partner who is aware of the diagnosis and is active in her care.    She started her current regimen of darunavir, ritonavir, Truvada and Isentress and she reports excellent compliance and tolerance.  She actually reports that her itching has finally resolved.  No difficulty with the bid regimen.  Viral load has decreased to its lowest level and the CD4 count has increased to 80.  Her repeat AFB blood culture has remained negative off of therapy.      Review of Systems  Constitutional: Negative for fever, appetite change and fatigue.  HENT: Negative for sore throat and trouble swallowing.   Gastrointestinal: Negative for abdominal pain, diarrhea and constipation.  Musculoskeletal: Negative for myalgias and arthralgias.  Skin: Negative for rash.  Psychiatric/Behavioral: Negative for dysphoric mood. The patient is not nervous/anxious.        Objective:   Physical Exam  Constitutional: She appears well-developed and well-nourished. No distress.  Cardiovascular: Normal rate and regular rhythm.  Exam reveals no gallop and no friction rub.   No murmur heard. Pulmonary/Chest: Effort normal and breath sounds normal. No respiratory distress. She has no wheezes. She has no rales.  Skin: Skin is warm and dry. No rash noted.  Psychiatric: She has a normal mood and affect. Her behavior is normal.          Assessment & Plan:

## 2011-07-17 DIAGNOSIS — T8859XA Other complications of anesthesia, initial encounter: Secondary | ICD-10-CM

## 2011-07-17 HISTORY — DX: Other complications of anesthesia, initial encounter: T88.59XA

## 2011-07-20 ENCOUNTER — Ambulatory Visit
Admission: RE | Admit: 2011-07-20 | Discharge: 2011-07-20 | Disposition: A | Discharge status: Home / Self Care | Payer: 59 | Source: Ambulatory Visit | Attending: Family Medicine | Admitting: Family Medicine

## 2011-07-20 ENCOUNTER — Other Ambulatory Visit: Payer: Self-pay | Admitting: Family Medicine

## 2011-07-20 DIAGNOSIS — R220 Localized swelling, mass and lump, head: Secondary | ICD-10-CM

## 2011-07-20 DIAGNOSIS — R221 Localized swelling, mass and lump, neck: Secondary | ICD-10-CM

## 2011-07-20 MED ORDER — IOHEXOL 300 MG/ML  SOLN
75.0000 mL | Freq: Once | INTRAMUSCULAR | Status: AC | PRN
  Administered 2011-07-20: 75 mL via INTRAVENOUS

## 2011-07-21 ENCOUNTER — Telehealth: Payer: Self-pay | Admitting: *Deleted

## 2011-07-21 ENCOUNTER — Telehealth: Payer: Self-pay | Admitting: Licensed Clinical Social Worker

## 2011-07-21 NOTE — Telephone Encounter (Signed)
Patient had a CT scan ordered by Dr. Laurine Blazer at [facility] that shows possible Lymphoma. The patient was told to call us this morning to be worked in with Dr. Luciana Axe. Per Dr. Luciana Axe the patient needs to see her primary doctor that ordered it to be referred to hematology/oncology or biopsy to determine a definite diagnosis of cancer. Patient understood and she called her PCP.

## 2011-07-21 NOTE — Telephone Encounter (Signed)
Call from Dr. Johnn Hai from [facility] [place], she did not have the name of the ID doctor she spoke to, but was told to wait to refer patient to oncology until we spoke with patient. After receiving call from Starleen Arms, CMA this morning, she will refer [name] to a thoracic surgeon to biopsy the lymph node and then to oncology. Wendall Mola CMA

## 2011-07-25 ENCOUNTER — Ambulatory Visit (INDEPENDENT_AMBULATORY_CARE_PROVIDER_SITE_OTHER): Payer: 59 | Admitting: Thoracic Surgery

## 2011-07-25 ENCOUNTER — Encounter: Payer: Self-pay | Admitting: Thoracic Surgery

## 2011-07-25 VITALS — BP 136/93 | HR 90 | Resp 18 | Ht 70.0 in | Wt 262.0 lb

## 2011-07-25 DIAGNOSIS — R220 Localized swelling, mass and lump, head: Secondary | ICD-10-CM

## 2011-07-25 DIAGNOSIS — R590 Localized enlarged lymph nodes: Secondary | ICD-10-CM | POA: Insufficient documentation

## 2011-07-25 DIAGNOSIS — R221 Localized swelling, mass and lump, neck: Secondary | ICD-10-CM | POA: Insufficient documentation

## 2011-07-25 DIAGNOSIS — R599 Enlarged lymph nodes, unspecified: Secondary | ICD-10-CM

## 2011-07-25 NOTE — Progress Notes (Signed)
PCP is No primary provider on file. Referring Provider is Emeterio Reeve, MD  Chief Complaint  Patient presents with  . Adenopathy    Referral from Dr Paulino Rily for eval on Enlarged mediastinal adn supraclavicular lymph nodes, CT neck on 07/20/2011    HPI: This 36 year old African American female is known to have HIV. She is developed left supraclavicular tenderness. A CT scan of the neck shows a left supraclavicular adenopathy and mediastinal adenopathy. The CT scan done 1 year ago which showed an a left AP window node enlargement. She's had no fever chills or weight loss. She is referred here for biopsy. Risk of the biopsy were explained to her and she agrees to the biopsy plan the left supraclavicular node biopsy.   Past Medical History  Diagnosis Date  . Candidal esophagitis   . HIV (human immunodeficiency virus infection)   . Anxiety   . MAC (mycobacterium avium-intracellulare complex)     positive culture, treated for 3 months    No past surgical history on file.  No family history on file.  Social History History  Substance Use Topics  . Smoking status: Never Smoker   . Smokeless tobacco: Never Used  . Alcohol Use: Yes     socially    Current Outpatient Prescriptions  Medication Sig Dispense Refill  . azithromycin (ZITHROMAX) 600 MG tablet Take 2 tablets (1,200 mg total) by mouth every 7 (seven) days.  24 tablet  0  . Darunavir Ethanolate (PREZISTA) 800 MG tablet Take 1 tablet (800 mg total) by mouth daily with breakfast.  90 tablet  3  . emtricitabine-tenofovir (TRUVADA) 200-300 MG per tablet Take 1 tablet by mouth daily.  90 tablet  3  . Multiple Vitamin (MULTIVITAMIN) capsule Take 1 capsule by mouth daily.        . raltegravir (ISENTRESS) 400 MG tablet Take 1 tablet (400 mg total) by mouth 2 (two) times daily.  180 tablet  3  . ritonavir (NORVIR) 100 MG TABS Take 1 tablet (100 mg total) by mouth daily with breakfast.  90 tablet  11  . sulfamethoxazole-trimethoprim  (BACTRIM DS) 800-160 MG per tablet Take 1 tablet by mouth daily.  90 tablet  3  . sulfamethoxazole-trimethoprim (BACTRIM DS) 800-160 MG per tablet Take 1 tablet by mouth daily.  90 tablet  0    No Known Allergies  Review of Systems  Constitutional: Negative.   HENT: Positive for neck pain.   Eyes: Negative.   Respiratory: Negative.   Cardiovascular: Negative.   Gastrointestinal: Negative.   Genitourinary: Negative.   Musculoskeletal: Negative for back pain and arthralgias.  Neurological: Negative.   Hematological: Negative.   Psychiatric/Behavioral: Negative.     BP 136/93  Pulse 90  Resp 18  Ht 5\' 10"  (1.778 m)  Wt 262 lb (118.842 kg)  BMI 37.59 kg/m2  SpO2 98%  LMP 07/06/2011 Physical Exam  Constitutional: She is oriented to person, place, and time. She appears well-developed and well-nourished.  HENT:  Head: Normocephalic and atraumatic.  Right Ear: External ear normal.  Left Ear: External ear normal.  Mouth/Throat: Oropharynx is clear and moist.  Eyes: Conjunctivae and EOM are normal. Pupils are equal, round, and reactive to light.  Neck: Neck supple.  Cardiovascular: Normal rate, regular rhythm, normal heart sounds and intact distal pulses.   Pulmonary/Chest: Effort normal and breath sounds normal.  Abdominal: Soft. Bowel sounds are normal.  Musculoskeletal: Normal range of motion.  Lymphadenopathy:    She has cervical adenopathy.  Neurological: She is alert and oriented to person, place, and time. She has normal reflexes.  Skin: Skin is warm and dry.  Psychiatric: She has a normal mood and affect. Her behavior is normal. Judgment and thought content normal.   left supraclavicular area shows multiple 1- 2 cm nodes that are tender to palpation.   Diagnostic Tests: CT scan of the neck showed multiple adenopathy on the left side in the supraclavicular area   Impression: Left supraclavicular adenopathy rule out lymphoma   Plan: Left supraclavicular node  biopsy

## 2011-07-26 ENCOUNTER — Encounter (HOSPITAL_COMMUNITY): Payer: Self-pay | Admitting: Respiratory Therapy

## 2011-07-26 ENCOUNTER — Other Ambulatory Visit: Payer: Self-pay

## 2011-07-26 DIAGNOSIS — R599 Enlarged lymph nodes, unspecified: Secondary | ICD-10-CM

## 2011-07-27 ENCOUNTER — Encounter (HOSPITAL_COMMUNITY): Payer: Self-pay

## 2011-07-27 ENCOUNTER — Encounter (HOSPITAL_COMMUNITY)
Admission: RE | Admit: 2011-07-27 | Discharge: 2011-07-27 | Disposition: A | Discharge status: Home / Self Care | Payer: 59 | Source: Ambulatory Visit | Attending: Thoracic Surgery | Admitting: Thoracic Surgery

## 2011-07-27 VITALS — BP 138/81 | HR 86 | Temp 98.1°F | Resp 20 | Ht 70.0 in | Wt 258.6 lb

## 2011-07-27 DIAGNOSIS — R599 Enlarged lymph nodes, unspecified: Secondary | ICD-10-CM

## 2011-07-27 LAB — COMPREHENSIVE METABOLIC PANEL
CO2: 21 mEq/L (ref 19–32)
Calcium: 9 mg/dL (ref 8.4–10.5)
Creatinine, Ser: 0.73 mg/dL (ref 0.50–1.10)
GFR calc Af Amer: >90 mL/min (ref >90)
GFR calc non Af Amer: >90 mL/min (ref >90)
Glucose, Bld: 69 mg/dL — ABNORMAL LOW (ref 70–99)

## 2011-07-27 LAB — PROTIME-INR: INR: 1.03 (ref 0.00–1.49)

## 2011-07-27 LAB — CBC
HCT: 37.7 % (ref 36.0–46.0)
Hemoglobin: 13.2 g/dL (ref 12.0–15.0)
MCH: 29.8 pg (ref 26.0–34.0)
MCV: 85.1 fL (ref 78.0–100.0)
RBC: 4.43 MIL/uL (ref 3.87–5.11)

## 2011-07-27 LAB — TYPE AND SCREEN: ABO/RH(D): O POS

## 2011-07-27 LAB — ABO/RH: ABO/RH(D): O POS

## 2011-07-27 LAB — SURGICAL PCR SCREEN: MRSA, PCR: NEGATIVE

## 2011-07-27 MED ORDER — DEXTROSE 5 % IV SOLN
1.5000 g | INTRAVENOUS | Status: AC
  Administered 2011-07-28: 1.5 g via INTRAVENOUS
  Filled 2011-07-27: qty 1.5

## 2011-07-27 NOTE — Pre-Procedure Instructions (Signed)
21 [name]  07/27/2011   Your procedure is scheduled on:  July 28, 2011 at 0715 am  Report to [facility] at 0530 AM.  Call this number if you have problems the morning of surgery: [phone]   Remember:   Do not eat food:After Midnight.  May have clear liquids: up to 4 Hours before arrival.0130 am  Clear liquids include soda, tea, black coffee, apple or grape juice, broth.  Take these medicines the morning of surgery with A SIP OF WATER: None   Do not wear jewelry, make-up or nail polish.  Do not wear lotions, powders, or perfumes. You may wear deodorant.  Do not shave 48 hours prior to surgery.  Do not bring valuables to the hospital.  Contacts, dentures or bridgework may not be worn into surgery.  Leave suitcase in the car. After surgery it may be brought to your room.  For patients admitted to the hospital, checkout time is 11:00 AM the day of discharge.   Patients discharged the day of surgery will not be allowed to drive home.  Name and phone number of your driver: mother or friend  Special Instructions: CHG Shower Use Special Wash: 1/2 bottle night before surgery and 1/2 bottle morning of surgery.   Please read over the following fact sheets that you were given: Pain Booklet, Coughing and Deep Breathing, MRSA Information and Surgical Site Infection Prevention

## 2011-07-28 ENCOUNTER — Encounter (HOSPITAL_COMMUNITY)
Admission: RE | Disposition: A | Discharge status: Home / Self Care | Payer: Self-pay | Source: Ambulatory Visit | Attending: Thoracic Surgery

## 2011-07-28 ENCOUNTER — Encounter (HOSPITAL_COMMUNITY): Payer: Self-pay | Admitting: Anesthesiology

## 2011-07-28 ENCOUNTER — Encounter (HOSPITAL_COMMUNITY): Payer: Self-pay | Admitting: *Deleted

## 2011-07-28 ENCOUNTER — Ambulatory Visit (HOSPITAL_COMMUNITY): Payer: 59 | Admitting: Anesthesiology

## 2011-07-28 ENCOUNTER — Ambulatory Visit (HOSPITAL_COMMUNITY)
Admission: RE | Admit: 2011-07-28 | Discharge: 2011-07-28 | Disposition: A | Discharge status: Home / Self Care | Payer: 59 | Source: Ambulatory Visit | Attending: Thoracic Surgery | Admitting: Thoracic Surgery

## 2011-07-28 DIAGNOSIS — D487 Neoplasm of uncertain behavior of other specified sites: Secondary | ICD-10-CM

## 2011-07-28 DIAGNOSIS — Z01810 Encounter for preprocedural cardiovascular examination: Secondary | ICD-10-CM | POA: Insufficient documentation

## 2011-07-28 DIAGNOSIS — Z01812 Encounter for preprocedural laboratory examination: Secondary | ICD-10-CM | POA: Insufficient documentation

## 2011-07-28 DIAGNOSIS — Z21 Asymptomatic human immunodeficiency virus [HIV] infection status: Secondary | ICD-10-CM | POA: Insufficient documentation

## 2011-07-28 DIAGNOSIS — Z01818 Encounter for other preprocedural examination: Secondary | ICD-10-CM | POA: Insufficient documentation

## 2011-07-28 DIAGNOSIS — I881 Chronic lymphadenitis, except mesenteric: Secondary | ICD-10-CM | POA: Insufficient documentation

## 2011-07-28 DIAGNOSIS — R599 Enlarged lymph nodes, unspecified: Secondary | ICD-10-CM

## 2011-07-28 HISTORY — PX: SUPRACLAVICAL NODE BIOPSY: SHX5165

## 2011-07-28 SURGERY — BIOPSY, LYMPH NODE, SUPRACLAVICULAR
Anesthesia: General | Site: Chest | Laterality: Left | Wound class: Clean

## 2011-07-28 MED ORDER — HYDROMORPHONE HCL PF 1 MG/ML IJ SOLN
0.2500 mg | INTRAMUSCULAR | Status: DC | PRN
  Administered 2011-07-28 (×4): 0.5 mg via INTRAVENOUS

## 2011-07-28 MED ORDER — ONDANSETRON HCL 4 MG/2ML IJ SOLN
INTRAMUSCULAR | Status: DC | PRN
  Administered 2011-07-28: 4 mg via INTRAVENOUS

## 2011-07-28 MED ORDER — LIDOCAINE HCL (CARDIAC) 20 MG/ML IV SOLN
INTRAVENOUS | Status: DC | PRN
  Administered 2011-07-28: 100 mg via INTRAVENOUS

## 2011-07-28 MED ORDER — HEMOSTATIC AGENTS (NO CHARGE) OPTIME
TOPICAL | Status: DC | PRN
  Administered 2011-07-28: 1 via TOPICAL

## 2011-07-28 MED ORDER — SUCCINYLCHOLINE CHLORIDE 20 MG/ML IJ SOLN
INTRAMUSCULAR | Status: DC | PRN
  Administered 2011-07-28: 100 mg via INTRAVENOUS

## 2011-07-28 MED ORDER — HYDROMORPHONE HCL PF 1 MG/ML IJ SOLN
INTRAMUSCULAR | Status: AC
  Filled 2011-07-28: qty 1

## 2011-07-28 MED ORDER — LACTATED RINGERS IV SOLN
INTRAVENOUS | Status: DC | PRN
  Administered 2011-07-28: 07:00:00 via INTRAVENOUS

## 2011-07-28 MED ORDER — 0.9 % SODIUM CHLORIDE (POUR BTL) OPTIME
TOPICAL | Status: DC | PRN
  Administered 2011-07-28: 1000 mL

## 2011-07-28 MED ORDER — FENTANYL CITRATE 0.05 MG/ML IJ SOLN
INTRAMUSCULAR | Status: DC | PRN
  Administered 2011-07-28 (×2): 50 ug via INTRAVENOUS

## 2011-07-28 MED ORDER — PROPOFOL 10 MG/ML IV EMUL
INTRAVENOUS | Status: DC | PRN
  Administered 2011-07-28: 200 mg via INTRAVENOUS
  Administered 2011-07-28: 30 mg via INTRAVENOUS

## 2011-07-28 MED ORDER — ONDANSETRON HCL 4 MG/2ML IJ SOLN: 4.0000 mg | Freq: Once | INTRAMUSCULAR | Status: DC | PRN

## 2011-07-28 SURGICAL SUPPLY — 41 items
ADH SKN CLS APL DERMABOND .7 (GAUZE/BANDAGES/DRESSINGS) ×1
ADH SKN CLS LQ APL DERMABOND (GAUZE/BANDAGES/DRESSINGS) ×1
CANISTER SUCTION 2500CC (MISCELLANEOUS) ×2 IMPLANT
CLIP TI MEDIUM 6 (CLIP) ×2 IMPLANT
CLOTH BEACON ORANGE TIMEOUT ST (SAFETY) ×2 IMPLANT
CONT SPEC 4OZ CLIKSEAL STRL BL (MISCELLANEOUS) ×4 IMPLANT
COVER SURGICAL LIGHT HANDLE (MISCELLANEOUS) ×4 IMPLANT
DERMABOND ADHESIVE PROPEN (GAUZE/BANDAGES/DRESSINGS) ×1
DERMABOND ADVANCED (GAUZE/BANDAGES/DRESSINGS) ×1
DERMABOND ADVANCED .7 DNX12 (GAUZE/BANDAGES/DRESSINGS) ×1 IMPLANT
DERMABOND ADVANCED .7 DNX6 (GAUZE/BANDAGES/DRESSINGS) IMPLANT
DRAPE LAPAROTOMY T 102X78X121 (DRAPES) ×2 IMPLANT
ELECT REM PT RETURN 9FT ADLT (ELECTROSURGICAL) ×2
ELECTRODE REM PT RTRN 9FT ADLT (ELECTROSURGICAL) ×1 IMPLANT
GLOVE BIOGEL PI IND STRL 7.0 (GLOVE) IMPLANT
GLOVE BIOGEL PI INDICATOR 7.0 (GLOVE) ×2
GLOVE SURG SIGNA 7.5 PF LTX (GLOVE) ×2 IMPLANT
GOWN BRE IMP PREV XXLGXLNG (GOWN DISPOSABLE) ×2 IMPLANT
GOWN EXTRA PROTECTION XL (GOWNS) ×2 IMPLANT
GOWN PREVENTION PLUS XXLARGE (GOWN DISPOSABLE) ×1 IMPLANT
GOWN STRL NON-REIN LRG LVL3 (GOWN DISPOSABLE) ×1 IMPLANT
HEMOSTAT SURGICEL 2X14 (HEMOSTASIS) ×1 IMPLANT
KIT BASIN OR (CUSTOM PROCEDURE TRAY) ×2 IMPLANT
KIT ROOM TURNOVER OR (KITS) ×2 IMPLANT
NEEDLE 22X1 1/2 (OR ONLY) (NEEDLE) IMPLANT
NS IRRIG 1000ML POUR BTL (IV SOLUTION) ×2 IMPLANT
PACK GENERAL/GYN (CUSTOM PROCEDURE TRAY) ×2 IMPLANT
PAD ARMBOARD 7.5X6 YLW CONV (MISCELLANEOUS) ×4 IMPLANT
SPONGE GAUZE 4X4 12PLY (GAUZE/BANDAGES/DRESSINGS) ×2 IMPLANT
SPONGE INTESTINAL PEANUT (DISPOSABLE) ×2 IMPLANT
SUT SILK 2 0 TIES 10X30 (SUTURE) ×2 IMPLANT
SUT VIC AB 2-0 CT1 27 (SUTURE) ×2
SUT VIC AB 2-0 CT1 TAPERPNT 27 (SUTURE) ×1 IMPLANT
SUT VIC AB 3-0 SH 27 (SUTURE)
SUT VIC AB 3-0 SH 27X BRD (SUTURE) ×1 IMPLANT
SUT VIC AB 3-0 X1 27 (SUTURE) ×2 IMPLANT
SYR CONTROL 10ML LL (SYRINGE) IMPLANT
TAPE CLOTH SURG 4X10 WHT LF (GAUZE/BANDAGES/DRESSINGS) ×1 IMPLANT
TOWEL OR 17X24 6PK STRL BLUE (TOWEL DISPOSABLE) ×2 IMPLANT
TOWEL OR 17X26 10 PK STRL BLUE (TOWEL DISPOSABLE) ×2 IMPLANT
WATER STERILE IRR 1000ML POUR (IV SOLUTION) ×2 IMPLANT

## 2011-07-28 NOTE — Interval H&P Note (Signed)
History and Physical Interval Note:  07/28/2011 7:04 AM  [name]  has presented today for surgery, with the diagnosis of ENLARGED MEDIASTINAL NODES  The various methods of treatment have been discussed with the patient and family. After consideration of risks, benefits and other options for treatment, the patient has consented to  Procedure(s) (LRB): SUPRACLAVICAL NODE BIOPSY (Left) as a surgical intervention .  The patients' history has been reviewed, patient examined, no change in status, stable for surgery.  I have reviewed the patients' chart and labs.  Questions were answered to the patient's satisfaction.     Cameron Proud

## 2011-07-28 NOTE — Op Note (Signed)
NAME:  ESRA, FRANKOWSKI                   ACCOUNT NO.:  MEDICAL RECORD NO.:  [id number]  LOCATION:                                 FACILITY:  PHYSICIAN:  Ines Bloomer, M.D. DATE OF BIRTH:  [birth year]  DATE OF PROCEDURE: DATE OF DISCHARGE:                              OPERATIVE REPORT   PREOPERATIVE DIAGNOSES:  Left supraclavicular adenopathy and mediastinal adenopathy.  POSTOPERATIVE DIAGNOSES:  Left supraclavicular adenopathy and mediastinal adenopathy.  OPERATION PERFORMED:  Biopsy of left supraclavicular nodes.  SURGEON:  Ines Bloomer, MD  ANESTHESIA:  General anesthesia.  DESCRIPTION OF PROCEDURE:  After adequate general anesthesia, the left neck was prepped and draped in the usual sterile manner.  A 2-2.5 cm incision was made over the large left supraclavicular nodal mass and dissection was carried down to the sternocleidomastoid which was split along its fibers.  The small retractor was inserted and a large nodal mass was identified.  We then used the biopsy forceps to do multiple biopsies of the area, sending some for permanent and frozen section, as well as flow cytometry.  We then packed the large node with a Surgicel and closed the strap muscles with 2-0 Vicryl and subcutaneous tissue with 2-0 Vicryl and Dermabond for the skin.  The patient returned to the recovery room in stable condition.     Ines Bloomer, M.D.     DPB/MEDQ  D:  07/28/2011  T:  07/28/2011  Job:  [id number]

## 2011-07-28 NOTE — Preoperative (Addendum)
Beta Blockers   Reason not to administer Beta Blockers:Not Applicable 

## 2011-07-28 NOTE — Anesthesia Procedure Notes (Signed)
Procedure Name: Intubation Date/Time: 07/28/2011 7:18 AM Performed by: Lovie Chol Pre-anesthesia Checklist: Patient identified, Emergency Drugs available, Suction available and Patient being monitored Patient Re-evaluated:Patient Re-evaluated prior to inductionOxygen Delivery Method: Circle system utilized Preoxygenation: Pre-oxygenation with 100% oxygen Intubation Type: IV induction Ventilation: Mask ventilation without difficulty Laryngoscope Size: Miller and 2 Grade View: Grade I Tube type: Oral Tube size: 7.5 mm Number of attempts: 1 Airway Equipment and Method: Stylet Placement Confirmation: ETT inserted through vocal cords under direct vision,  positive ETCO2 and breath sounds checked- equal and bilateral Secured at: 22 cm Tube secured with: Tape Dental Injury: Teeth and Oropharynx as per pre-operative assessment

## 2011-07-28 NOTE — Anesthesia Preprocedure Evaluation (Addendum)
Anesthesia Evaluation  Patient identified by MRN, date of birth, ID band Patient awake    Reviewed: Allergy & Precautions, H&P , NPO status , Patient's Chart, lab work & pertinent test results  History of Anesthesia Complications Negative for: history of anesthetic complications  Airway Mallampati: I TM Distance: >3 FB Neck ROM: Full    Dental  (+) Teeth Intact and Dental Advisory Given   Pulmonary  breath sounds clear to auscultation        Cardiovascular Rhythm:Regular Rate:Normal     Neuro/Psych    GI/Hepatic   Endo/Other    Renal/GU      Musculoskeletal   Abdominal   Peds  Hematology  (+) HIV,   Anesthesia Other Findings   Reproductive/Obstetrics                           Anesthesia Physical Anesthesia Plan  ASA: III  Anesthesia Plan: General   Post-op Pain Management:    Induction: Intravenous  Airway Management Planned: Oral ETT  Additional Equipment:   Intra-op Plan:   Post-operative Plan: Extubation in OR  Informed Consent: I have reviewed the patients History and Physical, chart, labs and discussed the procedure including the risks, benefits and alternatives for the proposed anesthesia with the patient or authorized representative who has indicated his/her understanding and acceptance.   Dental advisory given  Plan Discussed with: Anesthesiologist, CRNA and Surgeon  Anesthesia Plan Comments:        Anesthesia Quick Evaluation

## 2011-07-28 NOTE — Transfer of Care (Signed)
Immediate Anesthesia Transfer of Care Note  Patient: [name]  Procedure(s) Performed: Procedure(s) (LRB): SUPRACLAVICAL NODE BIOPSY (Left)  Patient Location: PACU  Anesthesia Type: General  Level of Consciousness: awake  Airway & Oxygen Therapy: Patient Spontanous Breathing and Patient connected to nasal cannula oxygen  Post-op Assessment: Report given to PACU RN and Post -op Vital signs reviewed and stable  Post vital signs: Reviewed  Complications: No apparent anesthesia complications

## 2011-07-28 NOTE — Brief Op Note (Signed)
07/28/2011  7:55 AM  PATIENT:  [name]  36 y.o. female  PRE-OPERATIVE DIAGNOSIS:  Mediastinal Adenopathy  POST-OPERATIVE DIAGNOSIS:  Mediastinal Adenopathy  PROCEDURE:  Procedure(s) (LRB): SUPRACLAVICAL NODE BIOPSY (Left)  SURGEON:  Surgeon(s) and Role:    * Ines Bloomer, MD - Primary  PHYSICIAN ASSISTANT:   ASSISTANTS: none   ANESTHESIA:   general  EBL:  Total I/O In: -  Out: 5 [Blood:5]  BLOOD ADMINISTERED:none  DRAINS: none   LOCAL MEDICATIONS USED:  NONE  SPECIMEN:  Biopsy / Limited Resection  DISPOSITION OF SPECIMEN:  PATHOLOGY  COUNTS:  YES  TOURNIQUET:  * No tourniquets in log *  DICTATION: .Other Dictation: Dictation Number [id number]  PLAN OF CARE: Discharge to home after PACU  PATIENT DISPOSITION:  PACU - hemodynamically stable.   Delay start of Pharmacological VTE agent (>24hrs) due to surgical blood loss or risk of bleeding: yes

## 2011-07-28 NOTE — H&P (View-Only) (Signed)
PCP is No primary provider on file. Referring Provider is Wolters, Sharon A, MD  Chief Complaint  Patient presents with  . Adenopathy    Referral from Dr Wolters for eval on Enlarged mediastinal adn supraclavicular lymph nodes, CT neck on 07/20/2011    HPI: This 35-year-old African American female is known to have HIV. She is developed left supraclavicular tenderness. A CT scan of the neck shows a left supraclavicular adenopathy and mediastinal adenopathy. The CT scan done 1 year ago which showed an a left AP window node enlargement. She's had no fever chills or weight loss. She is referred here for biopsy. Risk of the biopsy were explained to her and she agrees to the biopsy plan the left supraclavicular node biopsy.   Past Medical History  Diagnosis Date  . Candidal esophagitis   . HIV (human immunodeficiency virus infection)   . Anxiety   . MAC (mycobacterium avium-intracellulare complex)     positive culture, treated for 3 months    No past surgical history on file.  No family history on file.  Social History History  Substance Use Topics  . Smoking status: Never Smoker   . Smokeless tobacco: Never Used  . Alcohol Use: Yes     socially    Current Outpatient Prescriptions  Medication Sig Dispense Refill  . azithromycin (ZITHROMAX) 600 MG tablet Take 2 tablets (1,200 mg total) by mouth every 7 (seven) days.  24 tablet  0  . Darunavir Ethanolate (PREZISTA) 800 MG tablet Take 1 tablet (800 mg total) by mouth daily with breakfast.  90 tablet  3  . emtricitabine-tenofovir (TRUVADA) 200-300 MG per tablet Take 1 tablet by mouth daily.  90 tablet  3  . Multiple Vitamin (MULTIVITAMIN) capsule Take 1 capsule by mouth daily.        . raltegravir (ISENTRESS) 400 MG tablet Take 1 tablet (400 mg total) by mouth 2 (two) times daily.  180 tablet  3  . ritonavir (NORVIR) 100 MG TABS Take 1 tablet (100 mg total) by mouth daily with breakfast.  90 tablet  11  . sulfamethoxazole-trimethoprim  (BACTRIM DS) 800-160 MG per tablet Take 1 tablet by mouth daily.  90 tablet  3  . sulfamethoxazole-trimethoprim (BACTRIM DS) 800-160 MG per tablet Take 1 tablet by mouth daily.  90 tablet  0    No Known Allergies  Review of Systems  Constitutional: Negative.   HENT: Positive for neck pain.   Eyes: Negative.   Respiratory: Negative.   Cardiovascular: Negative.   Gastrointestinal: Negative.   Genitourinary: Negative.   Musculoskeletal: Negative for back pain and arthralgias.  Neurological: Negative.   Hematological: Negative.   Psychiatric/Behavioral: Negative.     BP 136/93  Pulse 90  Resp 18  Ht 5' 10" (1.778 m)  Wt 262 lb (118.842 kg)  BMI 37.59 kg/m2  SpO2 98%  LMP 07/06/2011 Physical Exam  Constitutional: She is oriented to person, place, and time. She appears well-developed and well-nourished.  HENT:  Head: Normocephalic and atraumatic.  Right Ear: External ear normal.  Left Ear: External ear normal.  Mouth/Throat: Oropharynx is clear and moist.  Eyes: Conjunctivae and EOM are normal. Pupils are equal, round, and reactive to light.  Neck: Neck supple.  Cardiovascular: Normal rate, regular rhythm, normal heart sounds and intact distal pulses.   Pulmonary/Chest: Effort normal and breath sounds normal.  Abdominal: Soft. Bowel sounds are normal.  Musculoskeletal: Normal range of motion.  Lymphadenopathy:    She has cervical adenopathy.    Neurological: She is alert and oriented to person, place, and time. She has normal reflexes.  Skin: Skin is warm and dry.  Psychiatric: She has a normal mood and affect. Her behavior is normal. Judgment and thought content normal.   left supraclavicular area shows multiple 1- 2 cm nodes that are tender to palpation.   Diagnostic Tests: CT scan of the neck showed multiple adenopathy on the left side in the supraclavicular area   Impression: Left supraclavicular adenopathy rule out lymphoma   Plan: Left supraclavicular node  biopsy  

## 2011-07-28 NOTE — Anesthesia Postprocedure Evaluation (Signed)
  Anesthesia Post-op Note  Patient: [name]  Procedure(s) Performed: Procedure(s) (LRB): SUPRACLAVICAL NODE BIOPSY (Left)  Patient Location: PACU  Anesthesia Type: General  Level of Consciousness: awake, oriented, sedated and patient cooperative  Airway and Oxygen Therapy: Patient Spontanous Breathing and Patient connected to nasal cannula oxygen  Post-op Pain: moderate  Post-op Assessment: Post-op Vital signs reviewed, Patient's Cardiovascular Status Stable, Respiratory Function Stable, Patent Airway, No signs of Nausea or vomiting and Pain level controlled  Post-op Vital Signs: stable  Complications: No apparent anesthesia complications

## 2011-07-31 ENCOUNTER — Encounter (HOSPITAL_COMMUNITY): Payer: Self-pay | Admitting: Thoracic Surgery

## 2011-08-01 ENCOUNTER — Other Ambulatory Visit: Payer: 59

## 2011-08-01 ENCOUNTER — Encounter: Payer: Self-pay | Admitting: Thoracic Surgery

## 2011-08-01 ENCOUNTER — Encounter: Payer: Self-pay | Admitting: Internal Medicine

## 2011-08-01 ENCOUNTER — Ambulatory Visit (INDEPENDENT_AMBULATORY_CARE_PROVIDER_SITE_OTHER): Payer: 59 | Admitting: Internal Medicine

## 2011-08-01 ENCOUNTER — Ambulatory Visit (INDEPENDENT_AMBULATORY_CARE_PROVIDER_SITE_OTHER): Payer: 59 | Admitting: Thoracic Surgery

## 2011-08-01 VITALS — BP 128/86 | HR 86 | Temp 99.7°F | Ht 70.0 in | Wt 260.0 lb

## 2011-08-01 VITALS — BP 127/81 | HR 87 | Resp 16 | Ht 70.0 in | Wt 160.0 lb

## 2011-08-01 DIAGNOSIS — A318 Other mycobacterial infections: Secondary | ICD-10-CM

## 2011-08-01 DIAGNOSIS — B20 Human immunodeficiency virus [HIV] disease: Secondary | ICD-10-CM

## 2011-08-01 DIAGNOSIS — R599 Enlarged lymph nodes, unspecified: Secondary | ICD-10-CM

## 2011-08-01 DIAGNOSIS — Z09 Encounter for follow-up examination after completed treatment for conditions other than malignant neoplasm: Secondary | ICD-10-CM

## 2011-08-01 DIAGNOSIS — R591 Generalized enlarged lymph nodes: Secondary | ICD-10-CM

## 2011-08-01 DIAGNOSIS — A312 Disseminated mycobacterium avium-intracellulare complex (DMAC): Secondary | ICD-10-CM

## 2011-08-01 DIAGNOSIS — R590 Localized enlarged lymph nodes: Secondary | ICD-10-CM

## 2011-08-01 LAB — TISSUE CULTURE

## 2011-08-01 MED ORDER — CLARITHROMYCIN 500 MG PO TABS: 500.0000 mg | ORAL_TABLET | Freq: Two times a day (BID) | ORAL | Status: DC

## 2011-08-01 MED ORDER — ETHAMBUTOL HCL 400 MG PO TABS: 1600.0000 mg | ORAL_TABLET | Freq: Every day | ORAL | Status: DC

## 2011-08-01 MED ORDER — RIFABUTIN 150 MG PO CAPS: 150.0000 mg | ORAL_CAPSULE | ORAL | Status: DC

## 2011-08-01 NOTE — Progress Notes (Signed)
  Subjective:    Patient ID: [name], female    DOB: [birth year], 36 y.o.   MRN: [id number]  HPI This patient comes in now for a work in visit. She was recently diagnosed with supraclavicular and cervical lymphadenopathy by her primary care physician who referred her directly to Dr. Edwyna Shell of cardiothoracic surgery.he did a biopsy of the lesion and sent it off for appropriate cultures and stains with a concern for lymphoma. 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 The patient does have a history of a positive mycobacterial avium blood culture that was noted prior to initiation of her antiretroviral therapy. She was started at that time on treatment which she did stay on for 3 months however due to the difficulty with her antiretroviral therapy and possible drug interactions, her MAC therapy was discontinued. Her followup surveillance cultures did remain negative as well. She was changed to her current regimen and she has had good immune constitution since.  She comes in here now to discuss treatment options for this granulomatous lesion.   Review of Systems  Constitutional: Negative for fever, chills, fatigue and unexpected weight change.  HENT: Negative for sore throat and trouble swallowing.   Cardiovascular: Negative for chest pain, palpitations and leg swelling.  Gastrointestinal: Negative for nausea, abdominal pain and diarrhea.  Musculoskeletal: Negative for myalgias and arthralgias.  Hematological: Positive for adenopathy.       Objective:   Physical Exam  Constitutional: She appears well-developed and well-nourished. No distress.  Lymphadenopathy:    She has cervical adenopathy.       Right cervical: Superficial cervical adenopathy present.       Left cervical: Superficial cervical and deep cervical adenopathy present.       Right axillary: No pectoral and no lateral adenopathy present.   Left axillary: No pectoral and no lateral adenopathy present.         Assessment & Plan:

## 2011-08-01 NOTE — Progress Notes (Signed)
HPI patient returns for followup. 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 No organisms were seen. Culture was taken. The patient was informed of the findings her family was informed of the findings. Her incision is healing well will see her back again in 2 weeks to check on the incision I called her infectious disease doctor Dr Luciana Axe and informed of the results. Current Outpatient Prescriptions  Medication Sig Dispense Refill  . azithromycin (ZITHROMAX) 600 MG tablet Take 2 tablets (1,200 mg total) by mouth every 7 (seven) days.  24 tablet  0  . Darunavir Ethanolate (PREZISTA) 800 MG tablet Take 1 tablet (800 mg total) by mouth daily with breakfast.  90 tablet  3  . emtricitabine-tenofovir (TRUVADA) 200-300 MG per tablet Take 1 tablet by mouth daily.  90 tablet  3  . escitalopram (LEXAPRO) 10 MG tablet Take 10 mg by mouth daily.      . Multiple Vitamin (MULTIVITAMIN) capsule Take 1 capsule by mouth daily.        . naproxen sodium (ANAPROX) 220 MG tablet Take 220-440 mg by mouth 2 (two) times daily as needed. For pain      . raltegravir (ISENTRESS) 400 MG tablet Take 1 tablet (400 mg total) by mouth 2 (two) times daily.  180 tablet  3  . ritonavir (NORVIR) 100 MG TABS Take 1 tablet (100 mg total) by mouth daily with breakfast.  90 tablet  11  . sulfamethoxazole-trimethoprim (BACTRIM DS) 800-160 MG per tablet Take 1 tablet by mouth daily.  90 tablet  0     Review of Systems: Unchanged   Physical Exam and clear attestation percussion   Diagnostic Tests: Left supraclavicular node biopsy showed necrotizing granulomas lymphadenitis   Impression: Necrotizing granulomas lymphadenitis left supraclavicular nodes   Plan: Refer to Dr.Comer return in 2 weeks

## 2011-08-01 NOTE — Assessment & Plan Note (Signed)
With her history of positive mycobacterial culture in the past I suspect that this is consistent with mycobacterial avium disease. Her immune her constitution has been improving and now likely developed this lymphadenopathy. I will start her on triple therapy including rifabutin 150 mg 3 times a week, ethambutol 1600 mg daily and clarithromycin 500 mg twice a day. She will stop her weekly azithromycin prophylaxis. She also will have her regular screening labs for HIV done today and we'll get her back in 2 weeks at her regular scheduled appointment. This was discussed with her, her partner and her mother who were all present. I also will continue to follow the tissue cultures. Appreciate the prompt evaluation by her primary care physician and by thoracic surgery.

## 2011-08-02 LAB — COMPLETE METABOLIC PANEL WITH GFR
ALT: 28 U/L (ref 0–35)
AST: 27 U/L (ref 0–37)
Albumin: 3.7 g/dL (ref 3.5–5.2)
Alkaline Phosphatase: 124 U/L — ABNORMAL HIGH (ref 39–117)
BUN: 9 mg/dL (ref 6–23)
CO2: 28 mEq/L (ref 19–32)
Chloride: 98 mEq/L (ref 96–112)
Glucose, Bld: 90 mg/dL (ref 70–99)
Sodium: 137 mEq/L (ref 135–145)

## 2011-08-02 LAB — CBC WITH DIFFERENTIAL/PLATELET
Eosinophils Relative: 6 % — ABNORMAL HIGH (ref 0–5)
HCT: 37.3 % (ref 36.0–46.0)
Lymphocytes Relative: 24 % (ref 12–46)
Lymphs Abs: 1.4 10*3/uL (ref 0.7–4.0)
MCV: 88.8 fL (ref 78.0–100.0)
Monocytes Absolute: 0.6 10*3/uL (ref 0.1–1.0)
RBC: 4.2 MIL/uL (ref 3.87–5.11)
RDW: 14.3 % (ref 11.5–15.5)
WBC: 5.9 10*3/uL (ref 4.0–10.5)

## 2011-08-02 LAB — T-HELPER CELL (CD4) - (RCID CLINIC ONLY)
CD4 % Helper T Cell: 10 % — ABNORMAL LOW (ref 33–55)
CD4 T Cell Abs: 140 uL — ABNORMAL LOW (ref 400–2700)

## 2011-08-03 ENCOUNTER — Other Ambulatory Visit: Payer: 59

## 2011-08-15 ENCOUNTER — Encounter: Payer: Self-pay | Admitting: Thoracic Surgery

## 2011-08-15 ENCOUNTER — Ambulatory Visit (INDEPENDENT_AMBULATORY_CARE_PROVIDER_SITE_OTHER): Payer: 59 | Admitting: Thoracic Surgery

## 2011-08-15 VITALS — BP 122/84 | HR 90 | Resp 20 | Ht 70.0 in | Wt 264.0 lb

## 2011-08-15 DIAGNOSIS — R599 Enlarged lymph nodes, unspecified: Secondary | ICD-10-CM

## 2011-08-15 DIAGNOSIS — R222 Localized swelling, mass and lump, trunk: Secondary | ICD-10-CM

## 2011-08-15 NOTE — Progress Notes (Signed)
HPI patient is doing well her incision is healing without difficulty. She's been started on treatment for MAI. We gave her a refill for Vicodin 5/325 #40. She will return if she has any further problems.  Current Outpatient Prescriptions  Medication Sig Dispense Refill  . ALPRAZolam (XANAX) 0.25 MG tablet Take 0.25 mg by mouth 3 (three) times daily as needed. Dr. Paulino Rily      . azithromycin (ZITHROMAX) 600 MG tablet Take 600 mg by mouth 2 (two) times daily. RX by Dr Luciana Axe per pt      . Darunavir Ethanolate (PREZISTA) 800 MG tablet Take 1 tablet (800 mg total) by mouth daily with breakfast.  90 tablet  3  . emtricitabine-tenofovir (TRUVADA) 200-300 MG per tablet Take 1 tablet by mouth daily.  90 tablet  3  . escitalopram (LEXAPRO) 10 MG tablet Take 10 mg by mouth daily.      Marland Kitchen ethambutol (MYAMBUTOL) 400 MG tablet Take 4 tablets (1,600 mg total) by mouth daily.  120 tablet  11  . HYDROcodone-acetaminophen (NORCO) 5-325 MG per tablet 1 tablet every 6 (six) hours as needed.       . Multiple Vitamin (MULTIVITAMIN) capsule Take 1 capsule by mouth daily.        . raltegravir (ISENTRESS) 400 MG tablet Take 1 tablet (400 mg total) by mouth 2 (two) times daily.  180 tablet  3  . rifabutin (MYCOBUTIN) 150 MG capsule Take 1 capsule (150 mg total) by mouth 3 (three) times a week.  12 capsule  11  . ritonavir (NORVIR) 100 MG TABS Take 1 tablet (100 mg total) by mouth daily with breakfast.  90 tablet  11  . sulfamethoxazole-trimethoprim (BACTRIM DS) 800-160 MG per tablet Take 1 tablet by mouth daily.  90 tablet  0  . clarithromycin (BIAXIN) 500 MG tablet Take 1 tablet (500 mg total) by mouth 2 (two) times daily.  60 tablet  11     Review of Systems: No change   Physical Exam healing left supraclavicular node incisions  Diagnostic Tests: None   Impression: Probable MAI   Plan: Followup by infectious disease service

## 2011-08-22 ENCOUNTER — Ambulatory Visit: Payer: 59 | Admitting: Internal Medicine

## 2011-08-28 ENCOUNTER — Other Ambulatory Visit: Payer: Self-pay | Admitting: *Deleted

## 2011-08-28 DIAGNOSIS — A312 Disseminated mycobacterium avium-intracellulare complex (DMAC): Secondary | ICD-10-CM

## 2011-08-28 MED ORDER — RIFABUTIN 150 MG PO CAPS: 150.0000 mg | ORAL_CAPSULE | ORAL | Status: DC

## 2011-08-31 ENCOUNTER — Ambulatory Visit (INDEPENDENT_AMBULATORY_CARE_PROVIDER_SITE_OTHER): Payer: 59 | Admitting: Internal Medicine

## 2011-08-31 ENCOUNTER — Encounter: Payer: Self-pay | Admitting: Internal Medicine

## 2011-08-31 ENCOUNTER — Other Ambulatory Visit: Payer: Self-pay | Admitting: *Deleted

## 2011-08-31 VITALS — BP 124/84 | HR 96 | Temp 98.1°F | Ht 70.0 in | Wt 257.0 lb

## 2011-08-31 DIAGNOSIS — B20 Human immunodeficiency virus [HIV] disease: Secondary | ICD-10-CM

## 2011-08-31 DIAGNOSIS — R599 Enlarged lymph nodes, unspecified: Secondary | ICD-10-CM

## 2011-08-31 DIAGNOSIS — A312 Disseminated mycobacterium avium-intracellulare complex (DMAC): Secondary | ICD-10-CM

## 2011-08-31 DIAGNOSIS — R590 Localized enlarged lymph nodes: Secondary | ICD-10-CM

## 2011-08-31 MED ORDER — RIFABUTIN 150 MG PO CAPS: 150.0000 mg | ORAL_CAPSULE | ORAL | Status: DC

## 2011-08-31 NOTE — Progress Notes (Signed)
  Subjective:    Patient ID: [name], female    DOB: [birth year], 36 y.o.   MRN: [id number]  HPI Reason for followup of her HIV. She also has recently been diagnosed by biopsy with granulomatous lymphadenitis and is presumed to be MAI infection since she had a positive blood culture back in September of 2012. All cultures from the biopsy remained negative. Having some drainage from her incision area and was swabbed positive for MSSA, resistant to Bactrim, and she was placed recently on doxycycline and by her primary physician. There was some pus drainage that is resolving. It is an open wound. No fever or chills. Her lymphadenopathy is stable, though not improving. He continues to have excellent adherence to her medications as a salvage regimen as well as her therapy for MAI. Her CD4 count has increased 150 and her viral load is continued to be low and now down to 425.   Review of Systems  Constitutional: Negative.   HENT: Negative for sore throat and trouble swallowing.   Eyes: Negative for photophobia, pain, discharge, redness, itching and visual disturbance.  Cardiovascular: Negative.   Gastrointestinal: Negative.   Musculoskeletal: Negative.   Skin:       Open wound with some serosanguineous drainage at this time, previously had pus  Neurological: Negative.   Hematological: Positive for adenopathy.       Unchanged cervical adenopathy  Psychiatric/Behavioral: Negative.        Objective:   Physical Exam  Constitutional: She appears well-developed and well-nourished. No distress.  HENT:  Mouth/Throat: Oropharynx is clear and moist. No oropharyngeal exudate.  Cardiovascular: Normal rate, regular rhythm and normal heart sounds.  Exam reveals no gallop and no friction rub.   No murmur heard. Pulmonary/Chest: Effort normal and breath sounds normal. No respiratory distress. She has no wheezes. She has no rales.  Skin: Skin is warm and dry. No rash noted. No erythema.       Wound on  neck at the incision site was open incision and serosanguineous drainage          Assessment & Plan:

## 2011-08-31 NOTE — Assessment & Plan Note (Signed)
As his unchanged though I explained to her that this is not unexpected as it likely will require good immune constitution before it resolves. She is continuing to take her therapy for MAI.  I have discussed with the patient that she should see Dr. Edwyna Shell again to see if he needs to close the wound once it has stopped draining. She will call him in approximately one week and continue the doxycycline.

## 2011-08-31 NOTE — Assessment & Plan Note (Signed)
He is having any good response with her new regimen. I will keep a close eye on her viral load and CD4 count and have her return in 2 months for labs.

## 2011-09-12 LAB — AFB CULTURE WITH SMEAR (NOT AT ARMC): Acid Fast Smear: NONE SEEN

## 2011-09-13 ENCOUNTER — Ambulatory Visit (INDEPENDENT_AMBULATORY_CARE_PROVIDER_SITE_OTHER): Payer: 59 | Admitting: Thoracic Surgery

## 2011-09-13 ENCOUNTER — Encounter: Payer: Self-pay | Admitting: Thoracic Surgery

## 2011-09-13 VITALS — BP 113/77 | HR 92 | Temp 99.3°F | Resp 18 | Ht 70.0 in | Wt 258.0 lb

## 2011-09-13 DIAGNOSIS — R599 Enlarged lymph nodes, unspecified: Secondary | ICD-10-CM

## 2011-09-13 DIAGNOSIS — T8131XA Disruption of external operation (surgical) wound, not elsewhere classified, initial encounter: Secondary | ICD-10-CM

## 2011-09-13 DIAGNOSIS — T8130XA Disruption of wound, unspecified, initial encounter: Secondary | ICD-10-CM

## 2011-09-13 NOTE — Progress Notes (Signed)
HPI this patient had a left supraclavicular node biopsy. She developed Mycobacterium avium intracellulare. As she started having drainage from the biopsy site. It is been treated with wet-to-dry. Wound is now healing with good granulation tissue. We will continue the same dressing treatments. We'll see her back again in 2 weeks. The adenopathy is markedly decreased since treatment has been initiated   Current Outpatient Prescriptions  Medication Sig Dispense Refill  . ALPRAZolam (XANAX) 0.25 MG tablet Take 0.25 mg by mouth 3 (three) times daily as needed. Dr. Paulino Rily      . Darunavir Ethanolate (PREZISTA) 800 MG tablet Take 1 tablet (800 mg total) by mouth daily with breakfast.  90 tablet  3  . emtricitabine-tenofovir (TRUVADA) 200-300 MG per tablet Take 1 tablet by mouth daily.  90 tablet  3  . escitalopram (LEXAPRO) 10 MG tablet Take 10 mg by mouth daily.      Marland Kitchen ethambutol (MYAMBUTOL) 400 MG tablet Take 4 tablets (1,600 mg total) by mouth daily.  120 tablet  11  . HYDROcodone-acetaminophen (NORCO) 5-325 MG per tablet 1 tablet every 6 (six) hours as needed.       . Multiple Vitamin (MULTIVITAMIN) capsule Take 1 capsule by mouth daily.        . raltegravir (ISENTRESS) 400 MG tablet Take 1 tablet (400 mg total) by mouth 2 (two) times daily.  180 tablet  3  . rifabutin (MYCOBUTIN) 150 MG capsule Take 1 capsule (150 mg total) by mouth 3 (three) times a week.  12 capsule  4  . ritonavir (NORVIR) 100 MG TABS Take 1 tablet (100 mg total) by mouth daily with breakfast.  90 tablet  11  . sulfamethoxazole-trimethoprim (BACTRIM DS) 800-160 MG per tablet Take 1 tablet by mouth daily.  90 tablet  0  . clarithromycin (BIAXIN) 500 MG tablet Take 1 tablet (500 mg total) by mouth 2 (two) times daily.  60 tablet  11     Review of Systems: Draining the left is supraclavicular wound   Physical Exam 6 left supraclavicular wound centimeters in distance with granulation   Diagnostic Tests: None   Impression:  Mycobacterium avium intracellulare adenopathy  Plan: Continue wet-to-dry dressings

## 2011-09-18 ENCOUNTER — Telehealth: Payer: Self-pay | Admitting: *Deleted

## 2011-09-18 NOTE — Telephone Encounter (Signed)
Left message to call [facility] for PAP smear appt or have her most recent PAP smear results faxed to [facility] for her record to be complete.

## 2011-09-27 ENCOUNTER — Ambulatory Visit: Payer: 59 | Admitting: Thoracic Surgery

## 2011-09-28 ENCOUNTER — Other Ambulatory Visit: Payer: Self-pay | Admitting: Licensed Clinical Social Worker

## 2011-09-28 ENCOUNTER — Other Ambulatory Visit: Payer: Self-pay | Admitting: *Deleted

## 2011-09-28 DIAGNOSIS — A312 Disseminated mycobacterium avium-intracellulare complex (DMAC): Secondary | ICD-10-CM

## 2011-09-28 MED ORDER — CLARITHROMYCIN 500 MG PO TABS: 500.0000 mg | ORAL_TABLET | Freq: Two times a day (BID) | ORAL | Status: DC

## 2011-09-28 MED ORDER — RIFABUTIN 150 MG PO CAPS: 150.0000 mg | ORAL_CAPSULE | ORAL | Status: DC

## 2011-10-23 ENCOUNTER — Telehealth: Payer: Self-pay | Admitting: *Deleted

## 2011-10-23 NOTE — Telephone Encounter (Signed)
Patient called and advised she no longer has insurance and needs some assistance with her myambitol. Advised that she has a week worth of the it left. I tried to transfer her to PAP coordinator Pam and her extension rang busy so advised the patient will have Pam call her and schedule an appt to come in and see if there is something we can help her with.

## 2011-10-30 ENCOUNTER — Other Ambulatory Visit: Payer: 59

## 2011-10-30 ENCOUNTER — Telehealth: Payer: Self-pay | Admitting: Licensed Clinical Social Worker

## 2011-10-30 NOTE — Telephone Encounter (Signed)
Patient is in between jobs and does not have insurance, she only has 3 days worth of Biaxin left. Pam with patient assistance was able to find help for her but only on Biaxin 1000 mg extended release. The patient wants to know can she take that particular medication, or can she go back to taking azithromycin? (she has 2 full bottles left over.) Patient is aware that Dr. Luciana Axe took her off because she developed infection being on the Azithromycin so it may not be as effective.

## 2011-10-31 ENCOUNTER — Telehealth: Payer: Self-pay | Admitting: Internal Medicine

## 2011-10-31 ENCOUNTER — Other Ambulatory Visit: Payer: Self-pay | Admitting: Licensed Clinical Social Worker

## 2011-10-31 DIAGNOSIS — A312 Disseminated mycobacterium avium-intracellulare complex (DMAC): Secondary | ICD-10-CM

## 2011-10-31 MED ORDER — CLARITHROMYCIN 500 MG PO TABS: 500.0000 mg | ORAL_TABLET | Freq: Two times a day (BID) | ORAL | Status: DC

## 2011-10-31 NOTE — Telephone Encounter (Signed)
[name] came in today and filled out the application for [employer].  I called the company to see if the application can be faxed.  In the process I was told this is a discount card - the price of the medication is 25 - 40% less than retail and is determined by the participating companies.  It takes 4 weeks for a mailed in application to be processed.  [name] will be out of her medication within the next 4 or 5 days.  I was told she can call and apply over the phone for immediate processing of her application.  I could not do it for her.  I called [name] and gave her the informatlion.  She will call and see what the medication will cost her.

## 2011-10-31 NOTE — Telephone Encounter (Signed)
Spoke with Dr. Drue Second and she thought it would be ok for the patient to take the biaxin 1000 er through patient assistance. Explained to the patient not to start back on azithromycin because it did not work last time for her, I also explained to the patient that she need to hold off on the other part of her treatment until the biaxin is delivered by mail in the next couple of weeks.

## 2011-11-07 ENCOUNTER — Other Ambulatory Visit: Payer: 59

## 2011-11-08 ENCOUNTER — Telehealth: Payer: Self-pay | Admitting: Internal Medicine

## 2011-11-08 NOTE — Telephone Encounter (Signed)
Left v/m for [name] to call me back concerning her Biaxin

## 2011-11-14 ENCOUNTER — Ambulatory Visit: Payer: 59 | Admitting: Internal Medicine

## 2011-11-21 ENCOUNTER — Ambulatory Visit: Payer: 59 | Admitting: Internal Medicine

## 2011-11-29 ENCOUNTER — Other Ambulatory Visit: Payer: BC Managed Care – PPO

## 2011-11-29 DIAGNOSIS — B20 Human immunodeficiency virus [HIV] disease: Secondary | ICD-10-CM

## 2011-11-30 LAB — COMPREHENSIVE METABOLIC PANEL
AST: 20 U/L (ref 0–37)
Alkaline Phosphatase: 98 U/L (ref 39–117)
BUN: 8 mg/dL (ref 6–23)
Creat: 0.89 mg/dL (ref 0.50–1.10)
Potassium: 4.1 mEq/L (ref 3.5–5.3)
Total Bilirubin: 0.3 mg/dL (ref 0.3–1.2)

## 2011-11-30 LAB — CBC WITH DIFFERENTIAL/PLATELET
Basophils Absolute: 0 10*3/uL (ref 0.0–0.1)
Basophils Relative: 1 % (ref 0–1)
Eosinophils Absolute: 0.2 10*3/uL (ref 0.0–0.7)
Eosinophils Relative: 6 % — ABNORMAL HIGH (ref 0–5)
HCT: 39 % (ref 36.0–46.0)
MCHC: 34.9 g/dL (ref 30.0–36.0)
Monocytes Absolute: 0.3 10*3/uL (ref 0.1–1.0)
Neutro Abs: 1 10*3/uL — ABNORMAL LOW (ref 1.7–7.7)
RDW: 14.6 % (ref 11.5–15.5)

## 2011-12-01 ENCOUNTER — Other Ambulatory Visit: Payer: 59

## 2011-12-01 LAB — HIV-1 RNA QUANT-NO REFLEX-BLD: HIV-1 RNA Quant, Log: 1.81 {Log} — ABNORMAL HIGH (ref <1.30)

## 2011-12-12 ENCOUNTER — Encounter: Payer: Self-pay | Admitting: Internal Medicine

## 2011-12-12 ENCOUNTER — Ambulatory Visit (INDEPENDENT_AMBULATORY_CARE_PROVIDER_SITE_OTHER): Payer: BC Managed Care – PPO | Admitting: Internal Medicine

## 2011-12-12 VITALS — BP 122/82 | HR 88 | Temp 97.8°F | Ht 70.0 in | Wt 279.0 lb

## 2011-12-12 DIAGNOSIS — R599 Enlarged lymph nodes, unspecified: Secondary | ICD-10-CM

## 2011-12-12 DIAGNOSIS — R590 Localized enlarged lymph nodes: Secondary | ICD-10-CM

## 2011-12-12 DIAGNOSIS — B20 Human immunodeficiency virus [HIV] disease: Secondary | ICD-10-CM

## 2011-12-12 DIAGNOSIS — Z113 Encounter for screening for infections with a predominantly sexual mode of transmission: Secondary | ICD-10-CM

## 2011-12-12 NOTE — Assessment & Plan Note (Signed)
She does have a small area of a wound at the incision site though this has been stable. She is using wet to dry dressing and I had her continue with that. She is to call Dr. Scheryl Darter office if there is any significant worsening with pus or surrounding erythema

## 2011-12-12 NOTE — Assessment & Plan Note (Signed)
He continues to do well on her current regimen. She continues to have excellent compliance. She knows she needs to continue her regimen for HIV and MAI and hopefully after a year of MAI therapy she will be able to stop that. She will return in about 4 months for routine followup.

## 2011-12-12 NOTE — Progress Notes (Signed)
  Subjective:    Patient ID: [name], female    DOB: Oct 22, 1975, 36 y.o.   MRN: [id number]  HPI She comes in here for followup of her HIV. She is currently on salvage regimen with Isentress, Truvada, Darunavir and ritonavir.  She also continues to take MAI therapy with erythromycin, rifabutin and ethambutol.  She reports continued excellent compliance and no missed doses. She does continue to have an open area over her surgical incision and is using continued wet-to-dry dressing. No pus or surrounding erythema.   Review of Systems  Constitutional: Negative for fever, appetite change, fatigue and unexpected weight change.  HENT: Negative for sore throat and trouble swallowing.   Respiratory: Negative for shortness of breath and wheezing.   Cardiovascular: Negative for chest pain, palpitations and leg swelling.  Gastrointestinal: Negative for nausea, abdominal pain and diarrhea.  Musculoskeletal: Negative for myalgias, joint swelling and arthralgias.  Neurological: Negative for dizziness and headaches.  Hematological: Positive for adenopathy.       Objective:   Physical Exam  Constitutional: She appears well-developed and well-nourished. No distress.  HENT:  Mouth/Throat: Oropharynx is clear and moist. No oropharyngeal exudate.       + 3-4 mm area of wound at end of incision, otherwise well-healed.  No erythema, no pus.   Neck:       improved  Cardiovascular: Normal rate, regular rhythm and normal heart sounds.  Exam reveals no gallop and no friction rub.   No murmur heard. Pulmonary/Chest: Effort normal and breath sounds normal. No respiratory distress. She has no wheezes. She has no rales.  Abdominal: Soft. Bowel sounds are normal. She exhibits no distension. There is no tenderness. There is no rebound.  Lymphadenopathy:    She has cervical adenopathy.          Assessment & Plan:

## 2011-12-14 ENCOUNTER — Ambulatory Visit: Payer: 59 | Admitting: Internal Medicine

## 2012-01-09 ENCOUNTER — Other Ambulatory Visit: Payer: Self-pay | Admitting: *Deleted

## 2012-01-09 DIAGNOSIS — B20 Human immunodeficiency virus [HIV] disease: Secondary | ICD-10-CM

## 2012-01-09 MED ORDER — SULFAMETHOXAZOLE-TMP DS 800-160 MG PO TABS: 1.0000 | ORAL_TABLET | Freq: Every day | ORAL | Status: DC

## 2012-01-19 ENCOUNTER — Other Ambulatory Visit: Payer: Self-pay | Admitting: *Deleted

## 2012-01-19 DIAGNOSIS — B20 Human immunodeficiency virus [HIV] disease: Secondary | ICD-10-CM

## 2012-01-19 MED ORDER — DARUNAVIR ETHANOLATE 800 MG PO TABS: 800.0000 mg | ORAL_TABLET | Freq: Every day | ORAL | Status: DC

## 2012-01-19 MED ORDER — EMTRICITABINE-TENOFOVIR DF 200-300 MG PO TABS: 1.0000 | ORAL_TABLET | Freq: Every day | ORAL | Status: DC

## 2012-01-19 MED ORDER — RALTEGRAVIR POTASSIUM 400 MG PO TABS: 400.0000 mg | ORAL_TABLET | Freq: Two times a day (BID) | ORAL | Status: DC

## 2012-01-19 MED ORDER — SULFAMETHOXAZOLE-TMP DS 800-160 MG PO TABS: 1.0000 | ORAL_TABLET | Freq: Every day | ORAL | Status: DC

## 2012-01-19 MED ORDER — RITONAVIR 100 MG PO TABS: 100.0000 mg | ORAL_TABLET | Freq: Every day | ORAL | Status: DC

## 2012-01-31 ENCOUNTER — Telehealth: Payer: Self-pay | Admitting: *Deleted

## 2012-01-31 ENCOUNTER — Other Ambulatory Visit: Payer: Self-pay | Admitting: *Deleted

## 2012-01-31 DIAGNOSIS — B20 Human immunodeficiency virus [HIV] disease: Secondary | ICD-10-CM

## 2012-01-31 MED ORDER — EMTRICITABINE-TENOFOVIR DF 200-300 MG PO TABS: 1.0000 | ORAL_TABLET | Freq: Every day | ORAL | Status: DC

## 2012-01-31 MED ORDER — DARUNAVIR ETHANOLATE 800 MG PO TABS: 800.0000 mg | ORAL_TABLET | Freq: Every day | ORAL | Status: DC

## 2012-01-31 MED ORDER — RALTEGRAVIR POTASSIUM 400 MG PO TABS: 400.0000 mg | ORAL_TABLET | Freq: Two times a day (BID) | ORAL | Status: DC

## 2012-01-31 MED ORDER — RITONAVIR 100 MG PO TABS: 100.0000 mg | ORAL_TABLET | Freq: Every day | ORAL | Status: DC

## 2012-01-31 NOTE — Telephone Encounter (Signed)
Received fax from insurance company that Hiv medications needed to be filled by [facility], meds sent and partner, Wever notified. Wendall Mola CMA

## 2012-02-12 ENCOUNTER — Other Ambulatory Visit: Payer: Self-pay | Admitting: *Deleted

## 2012-02-12 DIAGNOSIS — B20 Human immunodeficiency virus [HIV] disease: Secondary | ICD-10-CM

## 2012-02-12 MED ORDER — SULFAMETHOXAZOLE-TMP DS 800-160 MG PO TABS: 1.0000 | ORAL_TABLET | Freq: Every day | ORAL | Status: DC

## 2012-02-12 MED ORDER — RITONAVIR 100 MG PO TABS: 100.0000 mg | ORAL_TABLET | Freq: Every day | ORAL | Status: DC

## 2012-02-12 MED ORDER — EMTRICITABINE-TENOFOVIR DF 200-300 MG PO TABS: 1.0000 | ORAL_TABLET | Freq: Every day | ORAL | Status: DC

## 2012-02-12 MED ORDER — RALTEGRAVIR POTASSIUM 400 MG PO TABS: 400.0000 mg | ORAL_TABLET | Freq: Two times a day (BID) | ORAL | Status: DC

## 2012-02-12 MED ORDER — DARUNAVIR ETHANOLATE 800 MG PO TABS: 800.0000 mg | ORAL_TABLET | Freq: Every day | ORAL | Status: DC

## 2012-02-15 ENCOUNTER — Other Ambulatory Visit: Payer: Self-pay | Admitting: *Deleted

## 2012-02-15 DIAGNOSIS — B20 Human immunodeficiency virus [HIV] disease: Secondary | ICD-10-CM

## 2012-02-15 MED ORDER — EMTRICITABINE-TENOFOVIR DF 200-300 MG PO TABS: 1.0000 | ORAL_TABLET | Freq: Every day | ORAL | Status: DC

## 2012-02-15 MED ORDER — SULFAMETHOXAZOLE-TMP DS 800-160 MG PO TABS: 1.0000 | ORAL_TABLET | Freq: Every day | ORAL | Status: DC

## 2012-02-15 MED ORDER — DARUNAVIR ETHANOLATE 800 MG PO TABS: 800.0000 mg | ORAL_TABLET | Freq: Every day | ORAL | Status: DC

## 2012-02-15 MED ORDER — RITONAVIR 100 MG PO TABS: 100.0000 mg | ORAL_TABLET | Freq: Every day | ORAL | Status: DC

## 2012-02-15 MED ORDER — RALTEGRAVIR POTASSIUM 400 MG PO TABS: 400.0000 mg | ORAL_TABLET | Freq: Two times a day (BID) | ORAL | Status: DC

## 2012-02-15 NOTE — Telephone Encounter (Signed)
After multiple phone calls [employer] stated that [facility] is handling the pt's medications.  Refills sent to [facility].

## 2012-02-19 ENCOUNTER — Other Ambulatory Visit: Payer: Self-pay | Admitting: *Deleted

## 2012-02-19 DIAGNOSIS — A312 Disseminated mycobacterium avium-intracellulare complex (DMAC): Secondary | ICD-10-CM

## 2012-02-19 MED ORDER — ETHAMBUTOL HCL 400 MG PO TABS: 1600.0000 mg | ORAL_TABLET | Freq: Every day | ORAL | Status: DC

## 2012-03-13 ENCOUNTER — Other Ambulatory Visit: Payer: Self-pay | Admitting: *Deleted

## 2012-03-13 DIAGNOSIS — B20 Human immunodeficiency virus [HIV] disease: Secondary | ICD-10-CM

## 2012-03-13 DIAGNOSIS — A312 Disseminated mycobacterium avium-intracellulare complex (DMAC): Secondary | ICD-10-CM

## 2012-03-13 MED ORDER — RITONAVIR 100 MG PO TABS: 100.0000 mg | ORAL_TABLET | Freq: Every day | ORAL | Status: DC

## 2012-03-13 MED ORDER — SULFAMETHOXAZOLE-TMP DS 800-160 MG PO TABS: 1.0000 | ORAL_TABLET | Freq: Every day | ORAL | Status: DC

## 2012-03-13 MED ORDER — RALTEGRAVIR POTASSIUM 400 MG PO TABS: 400.0000 mg | ORAL_TABLET | Freq: Two times a day (BID) | ORAL | Status: DC

## 2012-03-13 MED ORDER — RIFABUTIN 150 MG PO CAPS: 150.0000 mg | ORAL_CAPSULE | ORAL | Status: DC

## 2012-03-13 MED ORDER — ETHAMBUTOL HCL 400 MG PO TABS: 1600.0000 mg | ORAL_TABLET | Freq: Every day | ORAL | Status: DC

## 2012-03-13 MED ORDER — DARUNAVIR ETHANOLATE 800 MG PO TABS: 800.0000 mg | ORAL_TABLET | Freq: Every day | ORAL | Status: DC

## 2012-03-13 MED ORDER — EMTRICITABINE-TENOFOVIR DF 200-300 MG PO TABS: 1.0000 | ORAL_TABLET | Freq: Every day | ORAL | Status: DC

## 2012-03-18 ENCOUNTER — Other Ambulatory Visit: Payer: Self-pay | Admitting: *Deleted

## 2012-03-20 ENCOUNTER — Other Ambulatory Visit: Payer: Self-pay | Admitting: *Deleted

## 2012-03-20 DIAGNOSIS — B20 Human immunodeficiency virus [HIV] disease: Secondary | ICD-10-CM

## 2012-03-20 MED ORDER — SULFAMETHOXAZOLE-TMP DS 800-160 MG PO TABS: 1.0000 | ORAL_TABLET | Freq: Every day | ORAL | Status: DC

## 2012-03-20 MED ORDER — RALTEGRAVIR POTASSIUM 400 MG PO TABS: 400.0000 mg | ORAL_TABLET | Freq: Two times a day (BID) | ORAL | Status: DC

## 2012-03-20 MED ORDER — RITONAVIR 100 MG PO TABS: 100.0000 mg | ORAL_TABLET | Freq: Every day | ORAL | Status: DC

## 2012-03-20 MED ORDER — EMTRICITABINE-TENOFOVIR DF 200-300 MG PO TABS: 1.0000 | ORAL_TABLET | Freq: Every day | ORAL | Status: DC

## 2012-03-20 MED ORDER — DARUNAVIR ETHANOLATE 800 MG PO TABS: 800.0000 mg | ORAL_TABLET | Freq: Every day | ORAL | Status: DC

## 2012-03-20 NOTE — Telephone Encounter (Signed)
Patient has a new insurance and they require a 90 day supply of the medication.

## 2012-04-01 ENCOUNTER — Other Ambulatory Visit (INDEPENDENT_AMBULATORY_CARE_PROVIDER_SITE_OTHER): Payer: BC Managed Care – PPO

## 2012-04-01 DIAGNOSIS — Z113 Encounter for screening for infections with a predominantly sexual mode of transmission: Secondary | ICD-10-CM

## 2012-04-01 DIAGNOSIS — B20 Human immunodeficiency virus [HIV] disease: Secondary | ICD-10-CM

## 2012-04-01 LAB — CBC WITH DIFFERENTIAL/PLATELET
Basophils Relative: 1 % (ref 0–1)
Eosinophils Absolute: 0.4 10*3/uL (ref 0.0–0.7)
Hemoglobin: 14 g/dL (ref 12.0–15.0)
Lymphs Abs: 2 10*3/uL (ref 0.7–4.0)
MCH: 29.9 pg (ref 26.0–34.0)
Monocytes Relative: 10 % (ref 3–12)
Neutro Abs: 2.9 10*3/uL (ref 1.7–7.7)
Neutrophils Relative %: 50 % (ref 43–77)
Platelets: 315 10*3/uL (ref 150–400)
RBC: 4.68 MIL/uL (ref 3.87–5.11)

## 2012-04-02 ENCOUNTER — Other Ambulatory Visit: Payer: BC Managed Care – PPO

## 2012-04-02 LAB — COMPLETE METABOLIC PANEL WITH GFR
ALT: 22 U/L (ref 0–35)
Albumin: 4.1 g/dL (ref 3.5–5.2)
Alkaline Phosphatase: 114 U/L (ref 39–117)
CO2: 28 mEq/L (ref 19–32)
Potassium: 4.7 mEq/L (ref 3.5–5.3)
Sodium: 139 mEq/L (ref 135–145)
Total Bilirubin: 0.3 mg/dL (ref 0.3–1.2)
Total Protein: 7.2 g/dL (ref 6.0–8.3)

## 2012-04-02 LAB — T-HELPER CELL (CD4) - (RCID CLINIC ONLY): CD4 T Cell Abs: 210 uL — ABNORMAL LOW (ref 400–2700)

## 2012-04-02 LAB — RPR

## 2012-04-03 LAB — HIV-1 RNA QUANT-NO REFLEX-BLD
HIV 1 RNA Quant: 95 copies/mL — ABNORMAL HIGH (ref <20)
HIV-1 RNA Quant, Log: 1.98 {Log} — ABNORMAL HIGH (ref <1.30)

## 2012-04-23 ENCOUNTER — Encounter: Payer: Self-pay | Admitting: Internal Medicine

## 2012-04-23 ENCOUNTER — Ambulatory Visit (INDEPENDENT_AMBULATORY_CARE_PROVIDER_SITE_OTHER): Payer: BC Managed Care – PPO | Admitting: Internal Medicine

## 2012-04-23 VITALS — BP 136/84 | HR 81 | Temp 98.1°F | Ht 70.0 in | Wt 292.0 lb

## 2012-04-23 DIAGNOSIS — B20 Human immunodeficiency virus [HIV] disease: Secondary | ICD-10-CM

## 2012-04-23 DIAGNOSIS — A319 Mycobacterial infection, unspecified: Secondary | ICD-10-CM

## 2012-04-23 NOTE — Assessment & Plan Note (Signed)
She continues to do well on her regimen and now has a CD4 over 200. I will continue her on her opportunistic infection prophylaxis with Bactrim and her HIV medicines

## 2012-04-23 NOTE — Assessment & Plan Note (Signed)
I will continue her on her current regimen. Her liver enzymes are stable in within normal limits. She is tolerating the medicine well. I plan to complete 12 months of therapy if her CD4 remains over 200 next time.

## 2012-04-23 NOTE — Progress Notes (Signed)
  Subjective:    Patient ID: [name], female    DOB: October 30, 1975, 37 y.o.   MRN: [id number]  HPI She comes in here for followup of her HIV. She is currently on salvage regimen with Isentress, Truvada, Darunavir and ritonavir. She also continues to take MAI therapy with erythromycin, rifabutin and ethambutol. She reports continued excellent compliance and no missed doses.  Since her diagnosis, she has had good weight gain though is interested in trying to level off for weight gain. She has been exercising and eating right however still has difficulty with her weight. She also has had complaints of dysuria and increased frequency and was treated for a urinary tract infection however urine culture was negative. She is going back to her primary physician again today to continue to address this. She denies any lesions and no discharge. No fever or chills.    Review of Systems  Constitutional: Negative for fever, chills, activity change, appetite change, fatigue and unexpected weight change.  HENT: Negative for sore throat and trouble swallowing.   Respiratory: Negative for cough and shortness of breath.   Cardiovascular: Negative for leg swelling.  Gastrointestinal: Negative for nausea, abdominal pain and diarrhea.  Musculoskeletal: Negative for myalgias and arthralgias.  Skin: Negative for rash.  Neurological: Negative for dizziness and headaches.  Hematological: Positive for adenopathy.       Much improved  Psychiatric/Behavioral: Negative for dysphoric mood.       Objective:   Physical Exam  Constitutional: She appears well-developed and well-nourished. No distress.  HENT:  Mouth/Throat: Oropharynx is clear and moist. No oropharyngeal exudate.  Neck:       Improved supraclavicular lymph node on the left  Cardiovascular: Normal rate, regular rhythm and normal heart sounds.  Exam reveals no gallop and no friction rub.   No murmur heard. Pulmonary/Chest: Effort normal and breath  sounds normal. No respiratory distress. She has no wheezes. She has no rales.  Skin: No rash noted.          Assessment & Plan:

## 2012-05-07 ENCOUNTER — Telehealth: Payer: Self-pay | Admitting: *Deleted

## 2012-05-07 NOTE — Telephone Encounter (Signed)
Message left for pt with appt information for annual PAP smear for Mon., Jan 27 @ 1200.  Requested that pt call back if she needs to reschedule.

## 2012-05-13 ENCOUNTER — Ambulatory Visit: Payer: BC Managed Care – PPO

## 2012-07-16 ENCOUNTER — Encounter: Payer: Self-pay | Admitting: *Deleted

## 2012-07-31 ENCOUNTER — Other Ambulatory Visit: Payer: Self-pay | Admitting: *Deleted

## 2012-07-31 DIAGNOSIS — A312 Disseminated mycobacterium avium-intracellulare complex (DMAC): Secondary | ICD-10-CM

## 2012-07-31 MED ORDER — RIFABUTIN 150 MG PO CAPS: 150.0000 mg | ORAL_CAPSULE | ORAL | Status: DC

## 2012-07-31 MED ORDER — CLARITHROMYCIN 500 MG PO TABS: 500.0000 mg | ORAL_TABLET | Freq: Two times a day (BID) | ORAL | Status: DC

## 2012-07-31 MED ORDER — ETHAMBUTOL HCL 400 MG PO TABS: 1600.0000 mg | ORAL_TABLET | Freq: Every day | ORAL | Status: DC

## 2012-08-22 ENCOUNTER — Other Ambulatory Visit (INDEPENDENT_AMBULATORY_CARE_PROVIDER_SITE_OTHER): Payer: BC Managed Care – PPO

## 2012-08-22 ENCOUNTER — Other Ambulatory Visit: Payer: Self-pay | Admitting: Internal Medicine

## 2012-08-22 DIAGNOSIS — B20 Human immunodeficiency virus [HIV] disease: Secondary | ICD-10-CM

## 2012-08-22 LAB — COMPLETE METABOLIC PANEL WITH GFR
ALT: 18 U/L (ref 0–35)
Albumin: 3.9 g/dL (ref 3.5–5.2)
CO2: 25 mEq/L (ref 19–32)
Chloride: 106 mEq/L (ref 96–112)
GFR, Est African American: >89 mL/min
Glucose, Bld: 104 mg/dL — ABNORMAL HIGH (ref 70–99)
Potassium: 5.1 mEq/L (ref 3.5–5.3)
Sodium: 139 mEq/L (ref 135–145)
Total Bilirubin: 0.3 mg/dL (ref 0.3–1.2)
Total Protein: 6.9 g/dL (ref 6.0–8.3)

## 2012-08-22 LAB — CBC WITH DIFFERENTIAL/PLATELET
Eosinophils Absolute: 0.3 10*3/uL (ref 0.0–0.7)
Hemoglobin: 13.3 g/dL (ref 12.0–15.0)
Lymphocytes Relative: 27 % (ref 12–46)
Lymphs Abs: 1.4 10*3/uL (ref 0.7–4.0)
Monocytes Relative: 8 % (ref 3–12)
Neutro Abs: 3.2 10*3/uL (ref 1.7–7.7)
Neutrophils Relative %: 59 % (ref 43–77)
Platelets: 308 10*3/uL (ref 150–400)
RBC: 4.58 MIL/uL (ref 3.87–5.11)
WBC: 5.3 10*3/uL (ref 4.0–10.5)

## 2012-09-05 ENCOUNTER — Ambulatory Visit: Payer: BC Managed Care – PPO | Admitting: Internal Medicine

## 2012-09-16 ENCOUNTER — Ambulatory Visit (INDEPENDENT_AMBULATORY_CARE_PROVIDER_SITE_OTHER): Payer: BC Managed Care – PPO | Admitting: Internal Medicine

## 2012-09-16 ENCOUNTER — Encounter: Payer: Self-pay | Admitting: Internal Medicine

## 2012-09-16 VITALS — BP 122/80 | HR 83 | Temp 98.5°F | Ht 70.0 in | Wt 297.0 lb

## 2012-09-16 DIAGNOSIS — B20 Human immunodeficiency virus [HIV] disease: Secondary | ICD-10-CM

## 2012-09-16 DIAGNOSIS — A319 Mycobacterial infection, unspecified: Secondary | ICD-10-CM

## 2012-09-16 NOTE — Assessment & Plan Note (Signed)
She does continue to have poor immune her constitution. Therefore I will continue with her current regimen. I may consider stopping rifabutin next visit for ease of dosing.

## 2012-09-16 NOTE — Progress Notes (Signed)
  Subjective:    Patient ID: [name], female    DOB: 06-Nov-1975, 37 y.o.   MRN: [id number]  HPI She comes in here for followup of her HIV. She is currently on salvage regimen with Isentress, Truvada, Darunavir and ritonavir. She also continues to take MAI therapy with claritnromycin, rifabutin and ethambutol. She reports continued excellent compliance and no missed doses. Since her diagnosis, she has had good weight gain though is interested in trying to level off for weight gain. She has been exercising and eating right however still has difficulty with her weight. . She denies any lesions and no discharge. No fever or chills.    Review of Systems  Constitutional: Negative for fever, fatigue and unexpected weight change.  HENT: Negative for sore throat and trouble swallowing.   Respiratory: Negative for shortness of breath.   Cardiovascular: Negative for chest pain.  Gastrointestinal: Negative for nausea and diarrhea.  Musculoskeletal: Negative for myalgias, joint swelling and arthralgias.  Skin: Negative for rash.  Neurological: Negative for dizziness, light-headedness and headaches.  Hematological: Negative for adenopathy.       Objective:   Physical Exam  Constitutional: She appears well-developed and well-nourished. No distress.  HENT:  Mouth/Throat: No oropharyngeal exudate.  Eyes: No scleral icterus.  Cardiovascular: Normal rate, regular rhythm and normal heart sounds.  Exam reveals no gallop and no friction rub.   No murmur heard. Pulmonary/Chest: Effort normal and breath sounds normal. No respiratory distress. She has no wheezes. She has no rales.  Lymphadenopathy:    She has no cervical adenopathy.          Assessment & Plan:

## 2012-09-16 NOTE — Assessment & Plan Note (Addendum)
Unfortunately, her HIV continues to have poor immune reconstitution and low level viremia. She does endorse continued compliance with no missed doses. She does have significant mutations so I do not feel that intensification would benefit her. I do suspect there may be some missed doses particularly with the number of medications she is needing to take. I will follow up with her in 3-4 months.  She should continue to take Bactrim prophylaxis

## 2012-09-19 ENCOUNTER — Other Ambulatory Visit: Payer: Self-pay | Admitting: Licensed Clinical Social Worker

## 2012-09-19 DIAGNOSIS — A312 Disseminated mycobacterium avium-intracellulare complex (DMAC): Secondary | ICD-10-CM

## 2012-09-19 MED ORDER — ETHAMBUTOL HCL 400 MG PO TABS: 1600.0000 mg | ORAL_TABLET | Freq: Every day | ORAL | Status: DC

## 2012-10-10 ENCOUNTER — Other Ambulatory Visit: Payer: Self-pay | Admitting: Internal Medicine

## 2012-11-02 ENCOUNTER — Other Ambulatory Visit: Payer: Self-pay | Admitting: Internal Medicine

## 2012-12-18 ENCOUNTER — Telehealth: Payer: Self-pay | Admitting: *Deleted

## 2012-12-18 NOTE — Telephone Encounter (Signed)
Seen by PCP for sore throat and started treatment with fluconazole.  Pt stated that she sometimes misses doses of her HIV medications.  RN advised that due to her CD4 count staying around 200 she has the potential to have thrush.  Taking her HIV medications consisently will continue to lower her viral load and increase her CD4 count which helps to decrease the likelihood of thrush.  Pt has upcoming lab and MD visits scheduled.  RN stressed the importance of keeping these appointments, taking her HIV medications and fluconazole.  Pt verbalized understanding.

## 2013-01-02 ENCOUNTER — Other Ambulatory Visit: Payer: Self-pay | Admitting: Internal Medicine

## 2013-01-02 ENCOUNTER — Other Ambulatory Visit: Payer: BC Managed Care – PPO

## 2013-01-02 DIAGNOSIS — B20 Human immunodeficiency virus [HIV] disease: Secondary | ICD-10-CM

## 2013-01-02 LAB — COMPLETE METABOLIC PANEL WITH GFR
ALT: 17 U/L (ref 0–35)
Albumin: 4.2 g/dL (ref 3.5–5.2)
CO2: 29 mEq/L (ref 19–32)
Calcium: 9.8 mg/dL (ref 8.4–10.5)
Chloride: 101 mEq/L (ref 96–112)
GFR, Est African American: >89 mL/min
Potassium: 4.9 mEq/L (ref 3.5–5.3)
Sodium: 139 mEq/L (ref 135–145)
Total Bilirubin: 0.3 mg/dL (ref 0.3–1.2)
Total Protein: 7.7 g/dL (ref 6.0–8.3)

## 2013-01-02 LAB — CBC WITH DIFFERENTIAL/PLATELET
Basophils Relative: 1 % (ref 0–1)
Eosinophils Absolute: 0.3 10*3/uL (ref 0.0–0.7)
Hemoglobin: 13.8 g/dL (ref 12.0–15.0)
MCH: 29.4 pg (ref 26.0–34.0)
MCHC: 34.1 g/dL (ref 30.0–36.0)
Monocytes Absolute: 0.6 10*3/uL (ref 0.1–1.0)
Monocytes Relative: 8 % (ref 3–12)
Neutrophils Relative %: 66 % (ref 43–77)
RDW: 14.3 % (ref 11.5–15.5)

## 2013-01-03 LAB — T-HELPER CELL (CD4) - (RCID CLINIC ONLY): CD4 T Cell Abs: 200 /uL — ABNORMAL LOW (ref 400–2700)

## 2013-01-05 LAB — HIV-1 RNA QUANT-NO REFLEX-BLD
HIV 1 RNA Quant: 30207 copies/mL — ABNORMAL HIGH (ref <20)
HIV-1 RNA Quant, Log: 4.48 {Log} — ABNORMAL HIGH (ref <1.30)

## 2013-01-06 ENCOUNTER — Other Ambulatory Visit: Payer: Self-pay | Admitting: Internal Medicine

## 2013-01-06 ENCOUNTER — Telehealth: Payer: Self-pay | Admitting: *Deleted

## 2013-01-06 DIAGNOSIS — B20 Human immunodeficiency virus [HIV] disease: Secondary | ICD-10-CM

## 2013-01-06 NOTE — Addendum Note (Signed)
Addended bySteva Colder on: 01/06/2013 11:57 AM   Modules accepted: Orders

## 2013-01-06 NOTE — Telephone Encounter (Signed)
Gentotype and Integrasse lab added per Dr. Luciana Axe. Wendall Mola

## 2013-01-10 LAB — HIV-1 GENOTYPR PLUS

## 2013-01-14 LAB — HIV-1 INTEGRASE GENOTYPE

## 2013-01-16 ENCOUNTER — Other Ambulatory Visit: Payer: Self-pay | Admitting: *Deleted

## 2013-01-16 ENCOUNTER — Encounter: Payer: Self-pay | Admitting: Internal Medicine

## 2013-01-16 ENCOUNTER — Ambulatory Visit (INDEPENDENT_AMBULATORY_CARE_PROVIDER_SITE_OTHER): Payer: BC Managed Care – PPO | Admitting: Internal Medicine

## 2013-01-16 VITALS — BP 135/91 | HR 87 | Temp 98.4°F | Ht 69.0 in | Wt 294.0 lb

## 2013-01-16 DIAGNOSIS — B20 Human immunodeficiency virus [HIV] disease: Secondary | ICD-10-CM

## 2013-01-16 DIAGNOSIS — Z23 Encounter for immunization: Secondary | ICD-10-CM

## 2013-01-16 MED ORDER — ONDANSETRON HCL 4 MG PO TABS: 4.0000 mg | ORAL_TABLET | Freq: Three times a day (TID) | ORAL | Status: DC | PRN

## 2013-01-16 MED ORDER — EMTRICITABINE-TENOFOVIR DF 200-300 MG PO TABS: 1.0000 | ORAL_TABLET | Freq: Every day | ORAL | Status: DC

## 2013-01-16 MED ORDER — DARUNAVIR ETHANOLATE 800 MG PO TABS: 800.0000 mg | ORAL_TABLET | Freq: Every day | ORAL | Status: DC

## 2013-01-16 MED ORDER — RITONAVIR 100 MG PO TABS: 100.0000 mg | ORAL_TABLET | Freq: Every day | ORAL | Status: DC

## 2013-01-16 MED ORDER — DOLUTEGRAVIR SODIUM 50 MG PO TABS: 50.0000 mg | ORAL_TABLET | Freq: Every day | ORAL | Status: DC

## 2013-01-16 NOTE — Progress Notes (Signed)
  Subjective:    Patient ID: [name], female    DOB: 08/08/1975, 37 y.o.   MRN: [id number]  HPI She comes in for routine followup. She recently got her labs and noted highly detectable virus over 30,000 copies. She had been having low level viremia in the 2 to 300s. She always claims good compliance however recently in discussion she did confirm that she periodically misses doses. In discussion with her today, she does endorse compliance issues and does not take the medications twice a day as she is supposed to. She denies any new problems including no diarrhea or weight loss. She did go recently treated for thrush but this has resolved. No swallowing difficulty.   Review of Systems  Constitutional: Negative for fever, appetite change and fatigue.  HENT: Negative for sore throat and trouble swallowing.   Eyes: Negative for visual disturbance.  Respiratory: Negative for cough and shortness of breath.   Cardiovascular: Negative for chest pain.  Gastrointestinal: Negative for nausea, abdominal pain and diarrhea.  Musculoskeletal: Negative for myalgias and arthralgias.  Skin: Negative for rash.  Neurological: Negative for dizziness, light-headedness and headaches.  Hematological: Negative for adenopathy.  Psychiatric/Behavioral: Negative for dysphoric mood.       Objective:   Physical Exam  Constitutional: She is oriented to person, place, and time. She appears well-developed and well-nourished. No distress.  HENT:  Mouth/Throat: No oropharyngeal exudate.  Eyes: No scleral icterus.  Cardiovascular: Normal rate, regular rhythm and normal heart sounds.   No murmur heard. Pulmonary/Chest: Effort normal and breath sounds normal. No respiratory distress.  Lymphadenopathy:    She has no cervical adenopathy.  Neurological: She is alert and oriented to person, place, and time.  Skin: Skin is warm and dry. No rash noted.  Psychiatric: She has a normal mood and affect. Her behavior is  normal.          Assessment & Plan:

## 2013-01-16 NOTE — Assessment & Plan Note (Addendum)
She was counseled by me in regards to compliance. I will stop her Isentress and changed to tivicay but otherwise the rest of her medication should work fine, the new genotype does not have any new significant mutations which makes me think it's more of a noncompliance issue. Her HIV medicines will now be once a day though with tivicay, Prezista, Norvir and Truvada. I did discuss with her options if she rules this which will include Fuzeon which are shots and older medicines such as AZT. I did explain that these have longer term side effects. She voiced her understanding and was here with her partner and mother who also are supportive of her and emphasized with her her need to comply  45 minutes was spent with the patient including 25 minutes of counseling, exam. She was seen by the pharmacist.

## 2013-01-20 ENCOUNTER — Other Ambulatory Visit: Payer: Self-pay | Admitting: Licensed Clinical Social Worker

## 2013-01-20 DIAGNOSIS — B20 Human immunodeficiency virus [HIV] disease: Secondary | ICD-10-CM

## 2013-01-31 ENCOUNTER — Other Ambulatory Visit: Payer: Self-pay | Admitting: *Deleted

## 2013-01-31 DIAGNOSIS — A312 Disseminated mycobacterium avium-intracellulare complex (DMAC): Secondary | ICD-10-CM

## 2013-01-31 MED ORDER — RIFABUTIN 150 MG PO CAPS: 150.0000 mg | ORAL_CAPSULE | ORAL | Status: DC

## 2013-02-13 ENCOUNTER — Other Ambulatory Visit: Payer: Self-pay | Admitting: *Deleted

## 2013-02-13 DIAGNOSIS — B20 Human immunodeficiency virus [HIV] disease: Secondary | ICD-10-CM

## 2013-02-13 MED ORDER — RITONAVIR 100 MG PO TABS: 100.0000 mg | ORAL_TABLET | Freq: Every day | ORAL | Status: DC

## 2013-02-20 ENCOUNTER — Other Ambulatory Visit: Payer: Self-pay | Admitting: *Deleted

## 2013-02-20 ENCOUNTER — Other Ambulatory Visit: Payer: BC Managed Care – PPO

## 2013-02-20 DIAGNOSIS — B20 Human immunodeficiency virus [HIV] disease: Secondary | ICD-10-CM

## 2013-02-20 LAB — COMPLETE METABOLIC PANEL WITH GFR
ALT: 17 U/L (ref 0–35)
AST: 18 U/L (ref 0–37)
Alkaline Phosphatase: 91 U/L (ref 39–117)
Calcium: 9.3 mg/dL (ref 8.4–10.5)
Chloride: 102 mEq/L (ref 96–112)
Creat: 0.97 mg/dL (ref 0.50–1.10)
Potassium: 4.8 mEq/L (ref 3.5–5.3)

## 2013-02-20 LAB — CBC WITH DIFFERENTIAL/PLATELET
Basophils Absolute: 0 10*3/uL (ref 0.0–0.1)
Basophils Relative: 1 % (ref 0–1)
Eosinophils Absolute: 0.3 10*3/uL (ref 0.0–0.7)
Eosinophils Relative: 3 % (ref 0–5)
Lymphs Abs: 1.7 10*3/uL (ref 0.7–4.0)
MCH: 28.5 pg (ref 26.0–34.0)
MCHC: 33.2 g/dL (ref 30.0–36.0)
MCV: 86 fL (ref 78.0–100.0)
Monocytes Absolute: 0.7 10*3/uL (ref 0.1–1.0)
Monocytes Relative: 8 % (ref 3–12)
Neutrophils Relative %: 68 % (ref 43–77)
Platelets: 336 10*3/uL (ref 150–400)
RBC: 4.7 MIL/uL (ref 3.87–5.11)

## 2013-02-20 MED ORDER — RITONAVIR 100 MG PO TABS: 100.0000 mg | ORAL_TABLET | Freq: Every day | ORAL | Status: DC

## 2013-02-21 LAB — T-HELPER CELL (CD4) - (RCID CLINIC ONLY): CD4 T Cell Abs: 190 /uL — ABNORMAL LOW (ref 400–2700)

## 2013-02-24 LAB — HIV-1 RNA QUANT-NO REFLEX-BLD
HIV 1 RNA Quant: 168 copies/mL — ABNORMAL HIGH (ref <20)
HIV-1 RNA Quant, Log: 2.23 {Log} — ABNORMAL HIGH (ref <1.30)

## 2013-02-27 ENCOUNTER — Ambulatory Visit (INDEPENDENT_AMBULATORY_CARE_PROVIDER_SITE_OTHER): Payer: BC Managed Care – PPO | Admitting: Internal Medicine

## 2013-02-27 ENCOUNTER — Encounter: Payer: Self-pay | Admitting: Internal Medicine

## 2013-02-27 VITALS — BP 126/82 | HR 89 | Temp 98.3°F | Wt 292.0 lb

## 2013-02-27 DIAGNOSIS — A319 Mycobacterial infection, unspecified: Secondary | ICD-10-CM

## 2013-02-27 DIAGNOSIS — B20 Human immunodeficiency virus [HIV] disease: Secondary | ICD-10-CM

## 2013-02-27 NOTE — Assessment & Plan Note (Signed)
She continues on her therapy and will need to continue until she gets her immune system up.

## 2013-02-27 NOTE — Progress Notes (Signed)
  Subjective:    Patient ID: [name], female    DOB: 1976-03-30, 37 y.o.   MRN: [id number]  HPI  She comes in for routine followup. She recently got her labs and noted highly detectable virus over 30,000 copies. She had been having low level viremia in the 2 to 300s. She always claims good compliance however recently in discussion she did confirm that she periodically misses doses.  Her regimen then was changed to tivicay with Prezista, Norvir and Truvada. She reports excellent compliance and her viral load has now decreased again to 168. Still has not been completely undetectable. Her CD4 count has not significantly elevated and remains at 190. She has some mild diarrhea and has had some weight loss and some nausea with the medication. Otherwise no new issues.   Review of Systems  Constitutional: Negative for fever, appetite change and fatigue.  HENT: Negative for sore throat and trouble swallowing.   Eyes: Negative for visual disturbance.  Respiratory: Negative for cough and shortness of breath.   Cardiovascular: Negative for chest pain.  Gastrointestinal: Negative for nausea, abdominal pain and diarrhea.  Musculoskeletal: Negative for arthralgias and myalgias.  Skin: Negative for rash.  Neurological: Negative for dizziness, light-headedness and headaches.  Hematological: Negative for adenopathy.  Psychiatric/Behavioral: Negative for dysphoric mood.       Objective:   Physical Exam  Constitutional: She is oriented to person, place, and time. She appears well-developed and well-nourished. No distress.  HENT:  Mouth/Throat: No oropharyngeal exudate.  Eyes: No scleral icterus.  Cardiovascular: Normal rate, regular rhythm and normal heart sounds.   No murmur heard. Pulmonary/Chest: Effort normal and breath sounds normal. No respiratory distress.  Lymphadenopathy:    She has no cervical adenopathy.  Neurological: She is alert and oriented to person, place, and time.  Skin:  Skin is warm and dry. No rash noted.  Psychiatric: She has a normal mood and affect. Her behavior is normal.          Assessment & Plan:

## 2013-02-27 NOTE — Assessment & Plan Note (Signed)
She has again better viral suppression but not completely undetectable. It may be too early or it may be that she continues to have detectable virus 2 to some underlying resistance to medications. Regardless, she will continue with what she has in the prophylaxis medicine. I will recheck her labs in 2 months and hopefully she will have suppression and CD4 count we'll begin to rise and she can begin to decrease her pill burden.

## 2013-04-14 ENCOUNTER — Other Ambulatory Visit: Payer: Self-pay | Admitting: *Deleted

## 2013-04-14 ENCOUNTER — Telehealth: Payer: Self-pay | Admitting: *Deleted

## 2013-04-14 DIAGNOSIS — B20 Human immunodeficiency virus [HIV] disease: Secondary | ICD-10-CM

## 2013-04-14 MED ORDER — SULFAMETHOXAZOLE-TMP DS 800-160 MG PO TABS: 1.0000 | ORAL_TABLET | Freq: Every day | ORAL | Status: DC

## 2013-04-14 NOTE — Telephone Encounter (Signed)
Patient left message asking [place] to contact [facility] for prescription refills, she thinks it is for Truvada.  1 year supply of Prezista, Truvada, Norvir, and Tivicay were sent and received electronically 01/16/13.  Per pharmacy tech, they have not filled Truvada since April 2014.  Spoke with the pharmacist.  The patient has to authorize each individual medication each month for delivery, but does not.  Per pharmacy records, patient does not always authorize her complete HIV therapy - has also been missing Norvir at times. RN declined for the pharmacist to look at the other medications to see if she has been authorizing them each time.  Should Truvada or other meds be stopped?  Please advise. Andree Coss, RN

## 2013-04-14 NOTE — Telephone Encounter (Signed)
Could you ask her why?  Is it a financial issue?  She has never been compliant.

## 2013-04-14 NOTE — Telephone Encounter (Signed)
I left a message asking Maryn to call us back.  When I asked the pharmacist, she said their records show that [facility] often hangs up before the transaction is complete.

## 2013-04-25 ENCOUNTER — Other Ambulatory Visit: Payer: Self-pay | Admitting: *Deleted

## 2013-04-25 DIAGNOSIS — B20 Human immunodeficiency virus [HIV] disease: Secondary | ICD-10-CM

## 2013-04-25 DIAGNOSIS — A312 Disseminated mycobacterium avium-intracellulare complex (DMAC): Secondary | ICD-10-CM

## 2013-04-25 MED ORDER — ETHAMBUTOL HCL 400 MG PO TABS: 1600.0000 mg | ORAL_TABLET | Freq: Every day | ORAL | Status: DC

## 2013-04-25 MED ORDER — RIFABUTIN 150 MG PO CAPS: 150.0000 mg | ORAL_CAPSULE | ORAL | Status: DC

## 2013-04-25 MED ORDER — EMTRICITABINE-TENOFOVIR DF 200-300 MG PO TABS: 1.0000 | ORAL_TABLET | Freq: Every day | ORAL | Status: DC

## 2013-04-25 MED ORDER — SULFAMETHOXAZOLE-TMP DS 800-160 MG PO TABS: 1.0000 | ORAL_TABLET | Freq: Every day | ORAL | Status: DC

## 2013-04-25 MED ORDER — DOLUTEGRAVIR SODIUM 50 MG PO TABS: 50.0000 mg | ORAL_TABLET | Freq: Every day | ORAL | Status: DC

## 2013-04-25 MED ORDER — DARUNAVIR ETHANOLATE 800 MG PO TABS: 800.0000 mg | ORAL_TABLET | Freq: Every day | ORAL | Status: DC

## 2013-04-25 MED ORDER — RITONAVIR 100 MG PO TABS
100.0000 mg | ORAL_TABLET | Freq: Every day | ORAL | Status: DC
Start: 2013-04-25 — End: 2013-05-21

## 2013-04-25 NOTE — Telephone Encounter (Signed)
Changed all long-term medications to [facility].

## 2013-04-29 ENCOUNTER — Other Ambulatory Visit: Payer: BC Managed Care – PPO

## 2013-05-01 ENCOUNTER — Other Ambulatory Visit: Payer: Self-pay | Admitting: Internal Medicine

## 2013-05-01 ENCOUNTER — Other Ambulatory Visit (INDEPENDENT_AMBULATORY_CARE_PROVIDER_SITE_OTHER): Payer: BC Managed Care – PPO

## 2013-05-01 DIAGNOSIS — B20 Human immunodeficiency virus [HIV] disease: Secondary | ICD-10-CM

## 2013-05-05 LAB — T-HELPER CELL (CD4) - (RCID CLINIC ONLY)
CD4 % Helper T Cell: 14 % — ABNORMAL LOW (ref 33–55)
CD4 T CELL ABS: 220 /uL — AB (ref 400–2700)

## 2013-05-05 LAB — HIV-1 RNA QUANT-NO REFLEX-BLD
HIV 1 RNA Quant: 10400 copies/mL — ABNORMAL HIGH (ref <20)
HIV-1 RNA QUANT, LOG: 4.02 {Log} — AB (ref <1.30)

## 2013-05-06 ENCOUNTER — Telehealth: Payer: Self-pay | Admitting: *Deleted

## 2013-05-06 ENCOUNTER — Other Ambulatory Visit: Payer: Self-pay | Admitting: Internal Medicine

## 2013-05-06 DIAGNOSIS — B20 Human immunodeficiency virus [HIV] disease: Secondary | ICD-10-CM

## 2013-05-06 NOTE — Telephone Encounter (Signed)
Genotype added and voice mail left for patient to call the clinic. [name]

## 2013-05-06 NOTE — Telephone Encounter (Signed)
Message copied by Georgena Spurling on Tue May 06, 2013  8:09 AM ------      Message from: Thayer Headings      Created: Tue May 06, 2013  7:17 AM       Please add a genotype and tell her to stop her ARVs until her appt with me since they aren't working.  Thanks ------

## 2013-05-06 NOTE — Addendum Note (Signed)
Addended by: Dolan Amen D on: 05/06/2013 09:29 AM   Modules accepted: Orders

## 2013-05-15 LAB — HIV-1 GENOTYPR PLUS

## 2013-05-21 ENCOUNTER — Other Ambulatory Visit: Payer: Self-pay | Admitting: *Deleted

## 2013-05-21 ENCOUNTER — Encounter: Payer: Self-pay | Admitting: Internal Medicine

## 2013-05-21 ENCOUNTER — Ambulatory Visit (INDEPENDENT_AMBULATORY_CARE_PROVIDER_SITE_OTHER): Payer: BC Managed Care – PPO | Admitting: Internal Medicine

## 2013-05-21 VITALS — BP 146/82 | HR 83 | Temp 98.8°F | Ht 70.0 in | Wt 285.0 lb

## 2013-05-21 DIAGNOSIS — B20 Human immunodeficiency virus [HIV] disease: Secondary | ICD-10-CM

## 2013-05-21 DIAGNOSIS — R221 Localized swelling, mass and lump, neck: Secondary | ICD-10-CM

## 2013-05-21 DIAGNOSIS — A319 Mycobacterial infection, unspecified: Secondary | ICD-10-CM

## 2013-05-21 DIAGNOSIS — R220 Localized swelling, mass and lump, head: Secondary | ICD-10-CM

## 2013-05-21 MED ORDER — DARUNAVIR ETHANOLATE 800 MG PO TABS
800.0000 mg | ORAL_TABLET | Freq: Every day | ORAL | Status: DC
Start: 2013-05-21 — End: 2014-05-11

## 2013-05-21 MED ORDER — RITONAVIR 100 MG PO TABS: 100.0000 mg | ORAL_TABLET | Freq: Every day | ORAL | Status: DC

## 2013-05-21 MED ORDER — DOLUTEGRAVIR SODIUM 50 MG PO TABS: 50.0000 mg | ORAL_TABLET | Freq: Every day | ORAL | Status: DC

## 2013-05-21 MED ORDER — EMTRICITABINE-TENOFOVIR DF 200-300 MG PO TABS: 1.0000 | ORAL_TABLET | Freq: Every day | ORAL | Status: DC

## 2013-05-21 NOTE — Assessment & Plan Note (Signed)
No current symptoms, will d/c meds today.

## 2013-05-21 NOTE — Progress Notes (Signed)
Patient ID: [name], female   DOB: May 31, 1975, 38 y.o.   MRN: [id number]   Subjective:    Patient ID: [name], female    DOB: Aug 18, 1975, 38 y.o.   MRN: [id number]  HPI:   Here for follow up.  Reports being out of Truvada for 2 weeks.  Labs do not show any activity.  CD4 though up to 220.  Viral load a baseline levels.  Denies missed doses.  Has a new pharmacy.  No weight loss, no diarrhea.  Feels well otherwise.  Also taking her MAI meds.     Review of Systems  Constitutional: Negative for fever, fatigue and unexpected weight change.  Gastrointestinal: Negative for nausea and diarrhea.  Skin: Negative for rash.  Psychiatric/Behavioral: Negative for dysphoric mood.       Objective:   Physical Exam  Constitutional: She appears well-developed and well-nourished. No distress.  HENT:  Mouth/Throat: No oropharyngeal exudate.  Eyes: Right eye exhibits no discharge. Left eye exhibits no discharge. No scleral icterus.  Cardiovascular: Normal rate, regular rhythm and normal heart sounds.   No murmur heard. Pulmonary/Chest: Effort normal and breath sounds normal. No respiratory distress. She has no wheezes.  Lymphadenopathy:    She has no cervical adenopathy.  Skin: No rash noted.           Lab Results  Component Value Date   HIV1RNAQUANT 10400* 05/01/2013   HIV1RNAQUANT 168* 02/20/2013   HIV1RNAQUANT 30207* 01/02/2013    Assessment & Plan:

## 2013-05-21 NOTE — Assessment & Plan Note (Signed)
Not sure why she is having trouble getting undetectable with reported complaince.  I will check again today and rtc for repeat in 3 weeks.  Called pharmacy to assure she is getting.  I will stop her MAC meds to simplify now that her CD4 is over 200 to help with compliance.  RTC 4 weeks

## 2013-05-23 LAB — HIV-1 RNA QUANT-NO REFLEX-BLD
HIV 1 RNA QUANT: 322 {copies}/mL — AB (ref <20)
HIV-1 RNA Quant, Log: 2.51 {Log} — ABNORMAL HIGH (ref <1.30)

## 2013-05-26 ENCOUNTER — Telehealth: Payer: Self-pay | Admitting: *Deleted

## 2013-05-26 NOTE — Telephone Encounter (Signed)
Message copied by Landis Gandy on Mon May 26, 2013  8:17 AM ------      Message from: Thayer Headings      Created: Sat May 24, 2013  4:16 PM       Please let her know her viral load is much better and we will check again in a few weeks as scheduled.  thanks ------

## 2013-05-26 NOTE — Telephone Encounter (Signed)
Left message with the following information.  Landis Gandy, RN

## 2013-06-11 ENCOUNTER — Other Ambulatory Visit: Payer: BC Managed Care – PPO

## 2013-06-19 ENCOUNTER — Ambulatory Visit: Payer: BC Managed Care – PPO | Admitting: Internal Medicine

## 2013-07-02 ENCOUNTER — Other Ambulatory Visit: Payer: BC Managed Care – PPO

## 2013-07-02 DIAGNOSIS — B20 Human immunodeficiency virus [HIV] disease: Secondary | ICD-10-CM

## 2013-07-03 LAB — T-HELPER CELL (CD4) - (RCID CLINIC ONLY)
CD4 % Helper T Cell: 13 % — ABNORMAL LOW (ref 33–55)
CD4 T CELL ABS: 310 /uL — AB (ref 400–2700)

## 2013-07-04 LAB — HIV-1 RNA QUANT-NO REFLEX-BLD: HIV 1 RNA Quant: <20 copies/mL (ref <20)

## 2013-08-06 ENCOUNTER — Ambulatory Visit (INDEPENDENT_AMBULATORY_CARE_PROVIDER_SITE_OTHER): Payer: BC Managed Care – PPO | Admitting: Internal Medicine

## 2013-08-06 ENCOUNTER — Encounter: Payer: Self-pay | Admitting: Internal Medicine

## 2013-08-06 VITALS — BP 138/85 | HR 76 | Temp 97.9°F | Ht 70.0 in | Wt 299.5 lb

## 2013-08-06 DIAGNOSIS — B20 Human immunodeficiency virus [HIV] disease: Secondary | ICD-10-CM

## 2013-08-06 DIAGNOSIS — Z113 Encounter for screening for infections with a predominantly sexual mode of transmission: Secondary | ICD-10-CM | POA: Insufficient documentation

## 2013-08-06 DIAGNOSIS — Z79899 Other long term (current) drug therapy: Secondary | ICD-10-CM

## 2013-08-06 NOTE — Assessment & Plan Note (Signed)
Doing great! Will continue and rtc in 3 months

## 2013-08-06 NOTE — Progress Notes (Signed)
Patient ID: [name], female   DOB: 1975-08-25, 38 y.o.   MRN: [id number]   Subjective:    Patient ID: [name], female    DOB: Jan 10, 1976, 38 y.o.   MRN: [id number]  HPI:   Here for follow up of HIV.  After I saw her last visit, she was having trouble getting dectable.  I decided to stop the MAC meds she had been on (+MAC in blood at presentation) to help with compliance.  Since then she has remained just on the ARVs and now is completely undetectable!  She feels well.  CD4 up to 310!  Review of Systems  Constitutional: Negative for fever, fatigue and unexpected weight change.  Gastrointestinal: Negative for nausea and diarrhea.  Skin: Negative for rash.  Psychiatric/Behavioral: Negative for dysphoric mood.       Objective:   Physical Exam  Constitutional: She appears well-developed and well-nourished. No distress.  HENT:  Mouth/Throat: No oropharyngeal exudate.  Eyes: Right eye exhibits no discharge. Left eye exhibits no discharge. No scleral icterus.  Cardiovascular: Normal rate, regular rhythm and normal heart sounds.   No murmur heard. Pulmonary/Chest: Effort normal and breath sounds normal. No respiratory distress. She has no wheezes.  Lymphadenopathy:    She has no cervical adenopathy.  Skin: No rash noted.           Lab Results  Component Value Date   HIV1RNAQUANT <20 07/02/2013   HIV1RNAQUANT 322* 05/21/2013   HIV1RNAQUANT 10400* 05/01/2013    Assessment & Plan:    HPI

## 2013-11-06 ENCOUNTER — Other Ambulatory Visit: Payer: BC Managed Care – PPO

## 2013-11-20 ENCOUNTER — Ambulatory Visit: Payer: BC Managed Care – PPO | Admitting: Internal Medicine

## 2013-11-25 ENCOUNTER — Other Ambulatory Visit: Payer: Self-pay | Admitting: Orthopaedic Surgery

## 2013-11-25 DIAGNOSIS — M25511 Pain in right shoulder: Secondary | ICD-10-CM

## 2013-11-30 ENCOUNTER — Ambulatory Visit
Admission: RE | Admit: 2013-11-30 | Discharge: 2013-11-30 | Disposition: A | Discharge status: Home / Self Care | Payer: BC Managed Care – PPO | Source: Ambulatory Visit | Attending: Orthopaedic Surgery | Admitting: Orthopaedic Surgery

## 2013-11-30 DIAGNOSIS — M25511 Pain in right shoulder: Secondary | ICD-10-CM

## 2013-12-15 ENCOUNTER — Other Ambulatory Visit: Payer: BC Managed Care – PPO

## 2013-12-15 DIAGNOSIS — Z113 Encounter for screening for infections with a predominantly sexual mode of transmission: Secondary | ICD-10-CM

## 2013-12-15 DIAGNOSIS — Z79899 Other long term (current) drug therapy: Secondary | ICD-10-CM

## 2013-12-15 DIAGNOSIS — B20 Human immunodeficiency virus [HIV] disease: Secondary | ICD-10-CM

## 2013-12-15 LAB — CBC WITH DIFFERENTIAL/PLATELET
Basophils Absolute: 0.1 10*3/uL (ref 0.0–0.1)
Basophils Relative: 1 % (ref 0–1)
EOS PCT: 4 % (ref 0–5)
Eosinophils Absolute: 0.4 10*3/uL (ref 0.0–0.7)
HEMATOCRIT: 38.1 % (ref 36.0–46.0)
Hemoglobin: 13.2 g/dL (ref 12.0–15.0)
LYMPHS PCT: 21 % (ref 12–46)
Lymphs Abs: 2 10*3/uL (ref 0.7–4.0)
MCH: 29.7 pg (ref 26.0–34.0)
MCHC: 34.6 g/dL (ref 30.0–36.0)
MCV: 85.6 fL (ref 78.0–100.0)
MONO ABS: 0.8 10*3/uL (ref 0.1–1.0)
Monocytes Relative: 8 % (ref 3–12)
Neutro Abs: 6.4 10*3/uL (ref 1.7–7.7)
Neutrophils Relative %: 66 % (ref 43–77)
Platelets: 330 10*3/uL (ref 150–400)
RBC: 4.45 MIL/uL (ref 3.87–5.11)
RDW: 13.9 % (ref 11.5–15.5)
WBC: 9.7 10*3/uL (ref 4.0–10.5)

## 2013-12-16 LAB — COMPLETE METABOLIC PANEL WITH GFR
ALT: 11 U/L (ref 0–35)
AST: 13 U/L (ref 0–37)
Albumin: 4 g/dL (ref 3.5–5.2)
Alkaline Phosphatase: 88 U/L (ref 39–117)
BUN: 11 mg/dL (ref 6–23)
CO2: 25 meq/L (ref 19–32)
CREATININE: 0.87 mg/dL (ref 0.50–1.10)
Calcium: 8.9 mg/dL (ref 8.4–10.5)
Chloride: 101 mEq/L (ref 96–112)
GFR, EST NON AFRICAN AMERICAN: 85 mL/min
GLUCOSE: 81 mg/dL (ref 70–99)
Potassium: 4.5 mEq/L (ref 3.5–5.3)
Sodium: 136 mEq/L (ref 135–145)
Total Bilirubin: 0.4 mg/dL (ref 0.2–1.2)
Total Protein: 6.9 g/dL (ref 6.0–8.3)

## 2013-12-16 LAB — T-HELPER CELL (CD4) - (RCID CLINIC ONLY)
CD4 % Helper T Cell: 15 % — ABNORMAL LOW (ref 33–55)
CD4 T Cell Abs: 350 /uL — ABNORMAL LOW (ref 400–2700)

## 2013-12-16 LAB — LIPID PANEL
CHOL/HDL RATIO: 4.2 ratio
Cholesterol: 167 mg/dL (ref 0–200)
HDL: 40 mg/dL (ref >39)
LDL CALC: 100 mg/dL — AB (ref 0–99)
TRIGLYCERIDES: 136 mg/dL (ref <150)
VLDL: 27 mg/dL (ref 0–40)

## 2013-12-16 LAB — HIV-1 RNA QUANT-NO REFLEX-BLD
HIV 1 RNA Quant: <20 copies/mL (ref <20)
HIV-1 RNA Quant, Log: <1.3 {Log} (ref <1.30)

## 2013-12-16 LAB — RPR

## 2014-01-06 ENCOUNTER — Ambulatory Visit: Payer: BC Managed Care – PPO | Admitting: Internal Medicine

## 2014-01-07 ENCOUNTER — Ambulatory Visit (INDEPENDENT_AMBULATORY_CARE_PROVIDER_SITE_OTHER): Payer: BC Managed Care – PPO | Admitting: Internal Medicine

## 2014-01-07 ENCOUNTER — Encounter: Payer: Self-pay | Admitting: Internal Medicine

## 2014-01-07 VITALS — BP 128/86 | HR 93 | Temp 98.2°F | Wt 301.0 lb

## 2014-01-07 DIAGNOSIS — Z23 Encounter for immunization: Secondary | ICD-10-CM

## 2014-01-07 DIAGNOSIS — B20 Human immunodeficiency virus [HIV] disease: Secondary | ICD-10-CM | POA: Diagnosis not present

## 2014-01-07 NOTE — Progress Notes (Signed)
Patient ID: [name], female   DOB: [birth year], 38 y.o.   MRN: [id number]   Subjective:    Patient ID: [name], female    DOB: February 11, 1976, 38 y.o.   MRN: [id number]  HPI:   Here for follow up of HIV.  In early 2015, she was having trouble getting dectable.  I decided to stop the MAC meds in Feb 2015 (+MAC in blood at presentation) to help with compliance.  Since then she has remained just on the ARVs and now is undetectable now x 2 visits.  CD4 up to 350.    Review of Systems  Constitutional: Negative for fever, fatigue and unexpected weight change.  Gastrointestinal: Negative for nausea and diarrhea.  Skin: Negative for rash.  Psychiatric/Behavioral: Negative for dysphoric mood.       Objective:   Physical Exam  Constitutional: She appears well-developed and well-nourished. No distress.  HENT:  Mouth/Throat: No oropharyngeal exudate.  Eyes: Right eye exhibits no discharge. Left eye exhibits no discharge. No scleral icterus.  Cardiovascular: Normal rate, regular rhythm and normal heart sounds.   No murmur heard. Pulmonary/Chest: Effort normal and breath sounds normal. No respiratory distress. She has no wheezes.  Lymphadenopathy:    She has no cervical adenopathy.  Skin: No rash noted.           Lab Results  Component Value Date   HIV1RNAQUANT <20 12/15/2013   HIV1RNAQUANT <20 07/02/2013   HIV1RNAQUANT 322* 05/21/2013    Assessment & Plan:    HPI

## 2014-01-07 NOTE — Assessment & Plan Note (Signed)
Continues to do great.  RTC 4 months.  Flu shot.  Discussed Prezcobix but pt prefers not to change with larger pill.

## 2014-01-08 ENCOUNTER — Encounter: Payer: Self-pay | Admitting: *Deleted

## 2014-04-27 ENCOUNTER — Other Ambulatory Visit: Payer: BLUE CROSS/BLUE SHIELD

## 2014-04-27 DIAGNOSIS — B20 Human immunodeficiency virus [HIV] disease: Secondary | ICD-10-CM

## 2014-04-29 LAB — T-HELPER CELL (CD4) - (RCID CLINIC ONLY)
CD4 T CELL ABS: 500 /uL (ref 400–2700)
CD4 T CELL HELPER: 18 % — AB (ref 33–55)

## 2014-04-29 LAB — HIV-1 RNA QUANT-NO REFLEX-BLD: HIV 1 RNA Quant: <20 copies/mL (ref <20)

## 2014-05-05 ENCOUNTER — Other Ambulatory Visit: Payer: Self-pay | Admitting: Family Medicine

## 2014-05-05 DIAGNOSIS — R1084 Generalized abdominal pain: Secondary | ICD-10-CM

## 2014-05-07 ENCOUNTER — Ambulatory Visit
Admission: RE | Admit: 2014-05-07 | Discharge: 2014-05-07 | Disposition: A | Discharge status: Home / Self Care | Payer: BLUE CROSS/BLUE SHIELD | Source: Ambulatory Visit | Attending: Family Medicine | Admitting: Family Medicine

## 2014-05-07 DIAGNOSIS — R1084 Generalized abdominal pain: Secondary | ICD-10-CM

## 2014-05-08 ENCOUNTER — Other Ambulatory Visit: Payer: BLUE CROSS/BLUE SHIELD

## 2014-05-11 ENCOUNTER — Ambulatory Visit (INDEPENDENT_AMBULATORY_CARE_PROVIDER_SITE_OTHER): Payer: BLUE CROSS/BLUE SHIELD | Admitting: Internal Medicine

## 2014-05-11 ENCOUNTER — Encounter: Payer: Self-pay | Admitting: Internal Medicine

## 2014-05-11 VITALS — BP 123/82 | HR 80 | Temp 97.3°F | Wt 297.0 lb

## 2014-05-11 DIAGNOSIS — B20 Human immunodeficiency virus [HIV] disease: Secondary | ICD-10-CM | POA: Diagnosis not present

## 2014-05-11 MED ORDER — DARUNAVIR ETHANOLATE 800 MG PO TABS: 800.0000 mg | ORAL_TABLET | Freq: Every day | ORAL | Status: DC

## 2014-05-11 MED ORDER — DOLUTEGRAVIR SODIUM 50 MG PO TABS: 50.0000 mg | ORAL_TABLET | Freq: Every day | ORAL | Status: DC

## 2014-05-11 MED ORDER — EMTRICITABINE-TENOFOVIR DF 200-300 MG PO TABS: 1.0000 | ORAL_TABLET | Freq: Every day | ORAL | Status: DC

## 2014-05-11 MED ORDER — RITONAVIR 100 MG PO TABS: 100.0000 mg | ORAL_TABLET | Freq: Every day | ORAL | Status: DC

## 2014-05-11 NOTE — Assessment & Plan Note (Addendum)
SHe continues to do well. I will consider having her changed to [facility] or [place] with Prezista if she remains undetectable.  He will return in about 4 months.

## 2014-05-11 NOTE — Progress Notes (Signed)
Patient ID: [name], female   DOB: 1976/01/10, 39 y.o.   MRN: [id number]   Subjective:    Patient ID: [name], female    DOB: 1975-05-21, 39 y.o.   MRN: [id number]  HPI:   Here for follow up of HIV.  She has been followed for about 3 years. She developed significant resistance and has been on a salvage regimen with Prezista, Norvir, Truvada and Tivicay. In early 2015, she was having trouble getting dectable.  I decided to stop the MAC meds in Feb 2015 (+MAC in blood at presentation) to help with compliance.  Since then she has remained just on the ARVs and now remains undetectable with a CD4 count now up to 500.  She has no complaints.  Review of Systems  Constitutional: Negative for fever, fatigue and unexpected weight change.  Gastrointestinal: Negative for nausea and diarrhea.  Skin: Negative for rash.  Psychiatric/Behavioral: Negative for dysphoric mood.       Objective:   Physical Exam  Constitutional: She appears well-developed and well-nourished. No distress.  HENT:  Mouth/Throat: No oropharyngeal exudate.  Eyes: Right eye exhibits no discharge. Left eye exhibits no discharge. No scleral icterus.  Cardiovascular: Normal rate, regular rhythm and normal heart sounds.   No murmur heard. Pulmonary/Chest: Effort normal and breath sounds normal. No respiratory distress. She has no wheezes.  Lymphadenopathy:    She has no cervical adenopathy.  Skin: No rash noted.           Lab Results  Component Value Date   HIV1RNAQUANT <20 04/27/2014   HIV1RNAQUANT <20 12/15/2013   HIV1RNAQUANT <20 07/02/2013    Assessment & Plan:    HPI

## 2014-05-26 ENCOUNTER — Other Ambulatory Visit: Payer: Self-pay | Admitting: Family Medicine

## 2014-05-26 DIAGNOSIS — R1031 Right lower quadrant pain: Secondary | ICD-10-CM

## 2014-05-27 ENCOUNTER — Ambulatory Visit
Admission: RE | Admit: 2014-05-27 | Discharge: 2014-05-27 | Disposition: A | Discharge status: Home / Self Care | Payer: BLUE CROSS/BLUE SHIELD | Source: Ambulatory Visit | Attending: Family Medicine | Admitting: Family Medicine

## 2014-05-27 DIAGNOSIS — R1031 Right lower quadrant pain: Secondary | ICD-10-CM

## 2014-08-26 ENCOUNTER — Other Ambulatory Visit: Payer: BLUE CROSS/BLUE SHIELD

## 2014-08-26 DIAGNOSIS — B20 Human immunodeficiency virus [HIV] disease: Secondary | ICD-10-CM

## 2014-08-28 LAB — T-HELPER CELL (CD4) - (RCID CLINIC ONLY)
CD4 T CELL ABS: 540 /uL (ref 400–2700)
CD4 T CELL HELPER: 20 % — AB (ref 33–55)

## 2014-08-29 LAB — HIV-1 RNA QUANT-NO REFLEX-BLD

## 2014-09-02 LAB — HLA B*5701: HLA-B*5701 w/rflx HLA-B High: NEGATIVE

## 2014-09-08 ENCOUNTER — Encounter: Payer: Self-pay | Admitting: Internal Medicine

## 2014-09-08 ENCOUNTER — Ambulatory Visit (INDEPENDENT_AMBULATORY_CARE_PROVIDER_SITE_OTHER): Payer: BLUE CROSS/BLUE SHIELD | Admitting: Internal Medicine

## 2014-09-08 VITALS — BP 136/83 | HR 85 | Temp 97.9°F | Ht 70.0 in | Wt 306.0 lb

## 2014-09-08 DIAGNOSIS — B20 Human immunodeficiency virus [HIV] disease: Secondary | ICD-10-CM

## 2014-09-08 MED ORDER — ELVITEG-COBIC-EMTRICIT-TENOFAF 150-150-200-10 MG PO TABS: 1.0000 | ORAL_TABLET | Freq: Every day | ORAL | Status: DC

## 2014-09-08 MED ORDER — DARUNAVIR ETHANOLATE 800 MG PO TABS: 800.0000 mg | ORAL_TABLET | Freq: Every day | ORAL | Status: DC

## 2014-09-08 NOTE — Assessment & Plan Note (Signed)
Doing great on the salvage regimen.  I will streamline her regimen and use TAF containing Genvoya and Prezista for long term benefits of less bone density issues and renal issues with TDF.  PAP and lipid screening per pcp rtc 3 months on new regimen.

## 2014-09-08 NOTE — Progress Notes (Signed)
Patient ID: [name], female   DOB: 04/18/1975, 39 y.o.   MRN: [id number]   Subjective:    Patient ID: [name], female    DOB: [birth year], 39 y.o.   MRN: [id number]  HPI:   Here for follow up of HIV.  She has been followed for about 3 years. She developed significant resistance and has been on a salvage regimen with Prezista, Norvir, Truvada and Tivicay. In early 2015, she was having trouble getting dectable.  I decided to stop the MAC meds in Feb 2015 (+MAC in blood at presentation) to help with compliance.  Since then she has remained just on the ARVs and now remains undetectable with a CD4 count now up to 540.  She has no complaints.  No missed doses.    Review of Systems  Constitutional: Negative for fever, fatigue and unexpected weight change.  Gastrointestinal: Negative for nausea and diarrhea.  Skin: Negative for rash.  Psychiatric/Behavioral: Negative for dysphoric mood.       Objective:   Physical Exam  Constitutional: She appears well-developed and well-nourished. No distress.  HENT:  Mouth/Throat: No oropharyngeal exudate.  Eyes: Right eye exhibits no discharge. Left eye exhibits no discharge. No scleral icterus.  Cardiovascular: Normal rate, regular rhythm and normal heart sounds.   No murmur heard. Pulmonary/Chest: Effort normal and breath sounds normal. No respiratory distress. She has no wheezes.  Lymphadenopathy:    She has no cervical adenopathy.  Skin: No rash noted.           Lab Results  Component Value Date   HIV1RNAQUANT <20 08/26/2014   HIV1RNAQUANT <20 04/27/2014   HIV1RNAQUANT <20 12/15/2013    Assessment & Plan:    HPI

## 2014-12-01 ENCOUNTER — Other Ambulatory Visit: Payer: BLUE CROSS/BLUE SHIELD

## 2014-12-08 ENCOUNTER — Other Ambulatory Visit: Payer: BLUE CROSS/BLUE SHIELD

## 2014-12-08 DIAGNOSIS — B20 Human immunodeficiency virus [HIV] disease: Secondary | ICD-10-CM

## 2014-12-08 LAB — CBC WITH DIFFERENTIAL/PLATELET
BASOS ABS: 0.1 10*3/uL (ref 0.0–0.1)
BASOS PCT: 1 % (ref 0–1)
Eosinophils Absolute: 0.4 10*3/uL (ref 0.0–0.7)
Eosinophils Relative: 4 % (ref 0–5)
HCT: 40.4 % (ref 36.0–46.0)
HEMOGLOBIN: 13.2 g/dL (ref 12.0–15.0)
Lymphocytes Relative: 25 % (ref 12–46)
Lymphs Abs: 2.6 10*3/uL (ref 0.7–4.0)
MCH: 28.3 pg (ref 26.0–34.0)
MCHC: 32.7 g/dL (ref 30.0–36.0)
MCV: 86.5 fL (ref 78.0–100.0)
MONO ABS: 0.9 10*3/uL (ref 0.1–1.0)
MPV: 9.3 fL (ref 8.6–12.4)
Monocytes Relative: 9 % (ref 3–12)
NEUTROS ABS: 6.3 10*3/uL (ref 1.7–7.7)
Neutrophils Relative %: 61 % (ref 43–77)
Platelets: 340 10*3/uL (ref 150–400)
RBC: 4.67 MIL/uL (ref 3.87–5.11)
RDW: 14.4 % (ref 11.5–15.5)
WBC: 10.3 10*3/uL (ref 4.0–10.5)

## 2014-12-08 LAB — COMPLETE METABOLIC PANEL WITH GFR
ALBUMIN: 3.8 g/dL (ref 3.6–5.1)
ALK PHOS: 90 U/L (ref 33–115)
ALT: 14 U/L (ref 6–29)
AST: 16 U/L (ref 10–30)
BUN: 11 mg/dL (ref 7–25)
CALCIUM: 9.4 mg/dL (ref 8.6–10.2)
CHLORIDE: 102 mmol/L (ref 98–110)
CO2: 27 mmol/L (ref 20–31)
Creat: 0.73 mg/dL (ref 0.50–1.10)
Glucose, Bld: 82 mg/dL (ref 65–99)
POTASSIUM: 4.6 mmol/L (ref 3.5–5.3)
Sodium: 138 mmol/L (ref 135–146)
Total Bilirubin: 0.2 mg/dL (ref 0.2–1.2)
Total Protein: 6.8 g/dL (ref 6.1–8.1)

## 2014-12-09 ENCOUNTER — Other Ambulatory Visit: Payer: BLUE CROSS/BLUE SHIELD

## 2014-12-10 LAB — HIV-1 RNA QUANT-NO REFLEX-BLD
HIV 1 RNA Quant: <20 copies/mL (ref <20)
HIV-1 RNA Quant, Log: <1.3 {Log} (ref <1.30)

## 2014-12-10 LAB — T-HELPER CELL (CD4) - (RCID CLINIC ONLY)
CD4 T CELL HELPER: 21 % — AB (ref 33–55)
CD4 T Cell Abs: 560 /uL (ref 400–2700)

## 2014-12-15 ENCOUNTER — Ambulatory Visit: Payer: BLUE CROSS/BLUE SHIELD | Admitting: Internal Medicine

## 2015-01-05 ENCOUNTER — Ambulatory Visit (INDEPENDENT_AMBULATORY_CARE_PROVIDER_SITE_OTHER): Payer: BLUE CROSS/BLUE SHIELD | Admitting: Internal Medicine

## 2015-01-05 ENCOUNTER — Encounter: Payer: Self-pay | Admitting: Internal Medicine

## 2015-01-05 VITALS — BP 131/83 | HR 79 | Temp 98.3°F | Wt 314.0 lb

## 2015-01-05 DIAGNOSIS — B20 Human immunodeficiency virus [HIV] disease: Secondary | ICD-10-CM

## 2015-01-05 DIAGNOSIS — Z113 Encounter for screening for infections with a predominantly sexual mode of transmission: Secondary | ICD-10-CM

## 2015-01-05 DIAGNOSIS — Z23 Encounter for immunization: Secondary | ICD-10-CM

## 2015-01-05 NOTE — Progress Notes (Signed)
Patient ID: [name], female   DOB: 10-20-1975, 39 y.o.   MRN: [id number]   Subjective:    Patient ID: [name], female    DOB: December 28, 1975, 39 y.o.   MRN: [id number]  HPI:   Here for follow up of HIV.  She has been followed for about 4 years. She developed significant resistance and has been on a salvage regimen with Prezista, Norvir, Truvada and Tivicay. In early 2015, she was having trouble getting dectable.  I stopped MAC durgs then changed her to [place] and continues to do well.  CD4 560, viral load undetectable.  She has no complaints.  No missed doses.    Review of Systems  Constitutional: Negative for fever, fatigue and unexpected weight change.  Gastrointestinal: Negative for nausea and diarrhea.  Skin: Negative for rash.  Psychiatric/Behavioral: Negative for dysphoric mood.       Objective:   Physical Exam  Constitutional: She appears well-developed and well-nourished. No distress.  HENT:  Mouth/Throat: No oropharyngeal exudate.  Eyes: Right eye exhibits no discharge. Left eye exhibits no discharge. No scleral icterus.  Cardiovascular: Normal rate, regular rhythm and normal heart sounds.   No murmur heard. Pulmonary/Chest: Effort normal and breath sounds normal. No respiratory distress. She has no wheezes.  Lymphadenopathy:    She has no cervical adenopathy.  Skin: No rash noted.           Lab Results  Component Value Date   HIV1RNAQUANT <20 12/08/2014   HIV1RNAQUANT <20 08/26/2014   HIV1RNAQUANT <20 04/27/2014    Assessment & Plan:    HPI

## 2015-01-05 NOTE — Assessment & Plan Note (Signed)
Doing great on salvage regimen.  rtc 4-5 months.

## 2015-03-05 ENCOUNTER — Ambulatory Visit (INDEPENDENT_AMBULATORY_CARE_PROVIDER_SITE_OTHER): Payer: BLUE CROSS/BLUE SHIELD

## 2015-03-05 DIAGNOSIS — Z23 Encounter for immunization: Secondary | ICD-10-CM | POA: Diagnosis not present

## 2015-06-08 ENCOUNTER — Other Ambulatory Visit: Payer: Self-pay | Admitting: Obstetrics and Gynecology

## 2015-09-07 ENCOUNTER — Ambulatory Visit: Payer: BLUE CROSS/BLUE SHIELD | Admitting: Internal Medicine

## 2015-09-16 ENCOUNTER — Other Ambulatory Visit: Payer: Self-pay | Admitting: *Deleted

## 2015-09-16 DIAGNOSIS — B20 Human immunodeficiency virus [HIV] disease: Secondary | ICD-10-CM

## 2015-09-16 MED ORDER — DARUNAVIR ETHANOLATE 800 MG PO TABS: 800.0000 mg | ORAL_TABLET | Freq: Every day | ORAL | Status: DC

## 2015-09-16 MED ORDER — ELVITEG-COBIC-EMTRICIT-TENOFAF 150-150-200-10 MG PO TABS: 1.0000 | ORAL_TABLET | Freq: Every day | ORAL | Status: DC

## 2015-10-18 ENCOUNTER — Ambulatory Visit: Payer: BLUE CROSS/BLUE SHIELD | Admitting: Internal Medicine

## 2015-11-11 ENCOUNTER — Other Ambulatory Visit: Payer: Self-pay | Admitting: Internal Medicine

## 2015-11-11 DIAGNOSIS — B20 Human immunodeficiency virus [HIV] disease: Secondary | ICD-10-CM

## 2015-11-12 ENCOUNTER — Other Ambulatory Visit: Payer: Self-pay | Admitting: *Deleted

## 2015-11-12 DIAGNOSIS — B20 Human immunodeficiency virus [HIV] disease: Secondary | ICD-10-CM

## 2015-11-12 MED ORDER — ELVITEG-COBIC-EMTRICIT-TENOFAF 150-150-200-10 MG PO TABS: 1.0000 | ORAL_TABLET | Freq: Every day | ORAL | 2 refills | Status: DC

## 2015-11-12 MED ORDER — DARUNAVIR ETHANOLATE 800 MG PO TABS: 800.0000 mg | ORAL_TABLET | Freq: Every day | ORAL | 2 refills | Status: DC

## 2015-11-15 ENCOUNTER — Other Ambulatory Visit: Payer: BLUE CROSS/BLUE SHIELD

## 2015-11-15 DIAGNOSIS — B20 Human immunodeficiency virus [HIV] disease: Secondary | ICD-10-CM

## 2015-11-15 DIAGNOSIS — Z113 Encounter for screening for infections with a predominantly sexual mode of transmission: Secondary | ICD-10-CM

## 2015-11-16 ENCOUNTER — Other Ambulatory Visit: Payer: BLUE CROSS/BLUE SHIELD

## 2015-11-16 LAB — URINE CYTOLOGY ANCILLARY ONLY
CHLAMYDIA, DNA PROBE: NEGATIVE
Neisseria Gonorrhea: NEGATIVE

## 2015-11-16 LAB — RPR

## 2015-11-17 LAB — T-HELPER CELL (CD4) - (RCID CLINIC ONLY)
CD4 % Helper T Cell: 19 % — ABNORMAL LOW (ref 33–55)
CD4 T CELL ABS: 520 /uL (ref 400–2700)

## 2015-11-17 LAB — HIV-1 RNA QUANT-NO REFLEX-BLD: HIV 1 RNA Quant: <20 copies/mL (ref <20)

## 2015-11-18 DIAGNOSIS — J029 Acute pharyngitis, unspecified: Secondary | ICD-10-CM | POA: Diagnosis not present

## 2015-11-30 ENCOUNTER — Ambulatory Visit (INDEPENDENT_AMBULATORY_CARE_PROVIDER_SITE_OTHER): Payer: BLUE CROSS/BLUE SHIELD | Admitting: Internal Medicine

## 2015-11-30 ENCOUNTER — Other Ambulatory Visit (HOSPITAL_COMMUNITY)
Admission: RE | Admit: 2015-11-30 | Discharge: 2015-11-30 | Disposition: A | Discharge status: Home / Self Care | Payer: BLUE CROSS/BLUE SHIELD | Source: Ambulatory Visit | Attending: Internal Medicine | Admitting: Internal Medicine

## 2015-11-30 ENCOUNTER — Encounter: Payer: Self-pay | Admitting: Internal Medicine

## 2015-11-30 VITALS — BP 149/88 | HR 89 | Temp 99.0°F | Ht 70.0 in | Wt 308.0 lb

## 2015-11-30 DIAGNOSIS — Z113 Encounter for screening for infections with a predominantly sexual mode of transmission: Secondary | ICD-10-CM

## 2015-11-30 DIAGNOSIS — B20 Human immunodeficiency virus [HIV] disease: Secondary | ICD-10-CM | POA: Diagnosis not present

## 2015-11-30 DIAGNOSIS — J390 Retropharyngeal and parapharyngeal abscess: Secondary | ICD-10-CM | POA: Insufficient documentation

## 2015-11-30 LAB — COMPLETE METABOLIC PANEL WITH GFR
ALBUMIN: 4.2 g/dL (ref 3.6–5.1)
ALK PHOS: 83 U/L (ref 33–115)
ALT: 14 U/L (ref 6–29)
AST: 19 U/L (ref 10–30)
BILIRUBIN TOTAL: 0.4 mg/dL (ref 0.2–1.2)
BUN: 8 mg/dL (ref 7–25)
CALCIUM: 9.3 mg/dL (ref 8.6–10.2)
CO2: 27 mmol/L (ref 20–31)
Chloride: 103 mmol/L (ref 98–110)
Creat: 0.8 mg/dL (ref 0.50–1.10)
GFR, Est African American: >89 mL/min (ref >=60)
GFR, Est Non African American: >89 mL/min (ref >=60)
GLUCOSE: 106 mg/dL — AB (ref 65–99)
Potassium: 4.7 mmol/L (ref 3.5–5.3)
SODIUM: 137 mmol/L (ref 135–146)
TOTAL PROTEIN: 7.2 g/dL (ref 6.1–8.1)

## 2015-11-30 LAB — CBC WITH DIFFERENTIAL/PLATELET
BASOS ABS: 0 {cells}/uL (ref 0–200)
BASOS PCT: 0 %
EOS PCT: 2 %
Eosinophils Absolute: 198 cells/uL (ref 15–500)
HCT: 40.5 % (ref 35.0–45.0)
Hemoglobin: 13.4 g/dL (ref 11.7–15.5)
Lymphs Abs: 2574 cells/uL (ref 850–3900)
MCH: 28.7 pg (ref 27.0–33.0)
MCHC: 33.1 g/dL (ref 32.0–36.0)
MCV: 86.7 fL (ref 80.0–100.0)
MPV: 9.5 fL (ref 7.5–12.5)
Monocytes Absolute: 693 cells/uL (ref 200–950)
Monocytes Relative: 7 %
NEUTROS PCT: 65 %
Neutro Abs: 6435 cells/uL (ref 1500–7800)
Platelets: 327 10*3/uL (ref 140–400)
RBC: 4.67 MIL/uL (ref 3.80–5.10)
RDW: 14.4 % (ref 11.0–15.0)
WBC: 9.9 10*3/uL (ref 3.8–10.8)

## 2015-11-30 NOTE — Assessment & Plan Note (Signed)
Doing great.  rtc 6 months.  

## 2015-11-30 NOTE — Assessment & Plan Note (Signed)
I will check a CT of the neck to see if she developed any fluid collection or if there is lymphadenopathy

## 2015-11-30 NOTE — Assessment & Plan Note (Signed)
Will screen today 

## 2015-11-30 NOTE — Progress Notes (Signed)
Patient ID: [name], female   DOB: [birth year], 40 y.o.   MRN: [id number]   Subjective:    Patient ID: [name], female    DOB: 14-Aug-1975, 40 y.o.   MRN: [id number]  HPI:   Here for follow up of HIV.   She has been followed for about 4 years. She developed significant resistance and has been on a salvage regimen with Prezista, Norvir, Truvada and Tivicay. In early 2015, she was having trouble getting dectable.  I stopped MAC durgs then changed her to [place] and continues to do well.  CD4 520, viral load undetectable.  She has no complaints.  No missed doses.   Complaint today of a sore throat.  Has been ongoing for 1 month.  Went to urgent care and rapid strep negative.  Feels similar to when she had MAI lymphadenopathy.    Review of Systems  Constitutional: Negative for fatigue, fever and unexpected weight change.  Gastrointestinal: Negative for diarrhea and nausea.  Skin: Negative for rash.  Psychiatric/Behavioral: Negative for dysphoric mood.       Objective:   Physical Exam  Constitutional: She appears well-developed and well-nourished. No distress.  HENT:  Mouth/Throat: No oropharyngeal exudate.  Eyes: Right eye exhibits no discharge. Left eye exhibits no discharge. No scleral icterus.  Cardiovascular: Normal rate, regular rhythm and normal heart sounds.   No murmur heard. Pulmonary/Chest: Effort normal and breath sounds normal. No respiratory distress. She has no wheezes.  Lymphadenopathy:    She has no cervical adenopathy.  Skin: No rash noted.           Lab Results  Component Value Date   HIV1RNAQUANT <20 11/15/2015   HIV1RNAQUANT <20 12/08/2014   HIV1RNAQUANT <20 08/26/2014   Social History   Social History  . Marital status: Single    Spouse name: N/A  . Number of children: N/A  . Years of education: N/A   Occupational History  . Not on file.   Social History Main Topics  . Smoking status: Never Smoker  . Smokeless  tobacco: Never Used  . Alcohol use Yes     Comment: socially  . Drug use: No  . Sexual activity: Not Currently    Partners: Female     Comment: pt. declined condoms   Other Topics Concern  . Not on file   Social History Narrative  . No narrative on file    Assessment & Plan:    HPI

## 2015-12-01 ENCOUNTER — Telehealth: Payer: Self-pay | Admitting: *Deleted

## 2015-12-01 LAB — RPR

## 2015-12-01 NOTE — Telephone Encounter (Signed)
Called patient to notify of appt for CT scan tomorrow at [place] at 11:15 pm. Patient will call to reschedule for a later time. Order faxed to [phone]. Myrtis Hopping

## 2015-12-02 ENCOUNTER — Encounter: Payer: Self-pay | Admitting: Internal Medicine

## 2015-12-02 DIAGNOSIS — K118 Other diseases of salivary glands: Secondary | ICD-10-CM | POA: Diagnosis not present

## 2015-12-02 DIAGNOSIS — J029 Acute pharyngitis, unspecified: Secondary | ICD-10-CM | POA: Diagnosis not present

## 2015-12-02 DIAGNOSIS — R590 Localized enlarged lymph nodes: Secondary | ICD-10-CM | POA: Diagnosis not present

## 2015-12-02 LAB — URINE CYTOLOGY ANCILLARY ONLY
Chlamydia: NEGATIVE
Neisseria Gonorrhea: NEGATIVE

## 2015-12-07 ENCOUNTER — Telehealth: Payer: Self-pay | Admitting: *Deleted

## 2015-12-07 NOTE — Telephone Encounter (Signed)
-----   Message from Thayer Headings, MD sent at 12/07/2015  9:55 AM EDT ----- Please let her know her recent CT neck does not show any concerns for her persistent sore throat.  No abscess or concerning lymphadenopathy thanks

## 2015-12-07 NOTE — Telephone Encounter (Signed)
Relayed results.  Patient asking about ENT referral? Previously saw Dr. Minna Merritts. Per patient, the pain is OK now - it has "eased up" and is tolerable at a 3/10 at this time. No problems swallowing or eating. Please advise if there are any next steps, or if she should continue to observe? Please advise. Landis Gandy, RN

## 2015-12-08 NOTE — Telephone Encounter (Signed)
Spoke with patient and Dr Berle Mull office.  Sent referral.

## 2015-12-08 NOTE — Telephone Encounter (Signed)
Continue to observe.  Can go to ENT if she wants for ? Sinus disease that may be causing this if it doesn't improve in a week or so.  thanks

## 2015-12-15 NOTE — Telephone Encounter (Signed)
CT was done at [facility] Report had already been sent offsite for scanning when the referral was requested/faxed to Dr. Ernesto Rutherford.  As of 8/30, the report has not yet been scanned and attached to the patient's chart.  RN unable to fax it at this time to Dr. Ernesto Rutherford.

## 2016-02-05 DIAGNOSIS — R300 Dysuria: Secondary | ICD-10-CM | POA: Diagnosis not present

## 2016-02-05 DIAGNOSIS — N390 Urinary tract infection, site not specified: Secondary | ICD-10-CM | POA: Diagnosis not present

## 2016-02-18 DIAGNOSIS — N3000 Acute cystitis without hematuria: Secondary | ICD-10-CM | POA: Diagnosis not present

## 2016-02-21 ENCOUNTER — Other Ambulatory Visit: Payer: Self-pay | Admitting: *Deleted

## 2016-02-21 DIAGNOSIS — B20 Human immunodeficiency virus [HIV] disease: Secondary | ICD-10-CM

## 2016-02-21 MED ORDER — ELVITEG-COBIC-EMTRICIT-TENOFAF 150-150-200-10 MG PO TABS: 1.0000 | ORAL_TABLET | Freq: Every day | ORAL | 1 refills | Status: DC

## 2016-02-21 MED ORDER — DARUNAVIR ETHANOLATE 800 MG PO TABS: 800.0000 mg | ORAL_TABLET | Freq: Every day | ORAL | 1 refills | Status: DC

## 2016-02-24 DIAGNOSIS — Z23 Encounter for immunization: Secondary | ICD-10-CM | POA: Diagnosis not present

## 2016-03-17 ENCOUNTER — Other Ambulatory Visit: Payer: Self-pay | Admitting: *Deleted

## 2016-03-17 DIAGNOSIS — B20 Human immunodeficiency virus [HIV] disease: Secondary | ICD-10-CM

## 2016-03-17 MED ORDER — DARUNAVIR ETHANOLATE 800 MG PO TABS: 800.0000 mg | ORAL_TABLET | Freq: Every day | ORAL | 1 refills | Status: DC

## 2016-03-17 MED ORDER — ELVITEG-COBIC-EMTRICIT-TENOFAF 150-150-200-10 MG PO TABS: 1.0000 | ORAL_TABLET | Freq: Every day | ORAL | 1 refills | Status: DC

## 2016-03-23 DIAGNOSIS — J309 Allergic rhinitis, unspecified: Secondary | ICD-10-CM | POA: Diagnosis not present

## 2016-03-23 DIAGNOSIS — J069 Acute upper respiratory infection, unspecified: Secondary | ICD-10-CM | POA: Diagnosis not present

## 2016-04-09 DIAGNOSIS — J0100 Acute maxillary sinusitis, unspecified: Secondary | ICD-10-CM | POA: Diagnosis not present

## 2016-05-18 ENCOUNTER — Other Ambulatory Visit: Payer: BLUE CROSS/BLUE SHIELD

## 2016-05-31 ENCOUNTER — Telehealth: Payer: Self-pay | Admitting: Pharmacist Clinician (PhC)/ Clinical Pharmacy Specialist

## 2016-05-31 NOTE — Telephone Encounter (Signed)
HIV Genotype Composite Data Genotype Dates:   Mutations in Bold impact drug susceptibility RT Mutations M184IV, K219R, V108I, E138GKR, V90I  PI Mutations None  Integrase Mutations None   Interpretation of Genotype Data per [employer] HIV Database Nucleoside RTIs  abacavir (ABC) Low-Level Resistance zidovudine (AZT) Susceptible emtricitabine (FTC) High-Level Resistance lamivudine (3TC) High-Level Resistance tenofovir (TDF) Susceptible   Non-Nucleoside RTIs  efavirenz (EFV) Low-Level Resistance etravirine (ETR) Potential Low-Level Resistance nevirapine (NVP) Low-Level Resistance rilpivirine (RPV) High-Level Resistance   Protease Inhibitors  atazanavir/r (ATV/r) Susceptible darunavir/r (DRV/r) Susceptible lopinavir/r (LPV/r) Susceptible   Integrase Inhibitors  Raltegravir Resistance NOT PREDICTED   Elvitegravir Resistance NOT PREDICTED   Dolutegravir Resistance NOT PREDICTED

## 2016-06-01 ENCOUNTER — Ambulatory Visit: Payer: Self-pay

## 2016-06-01 ENCOUNTER — Ambulatory Visit: Payer: BLUE CROSS/BLUE SHIELD | Admitting: Internal Medicine

## 2016-06-07 ENCOUNTER — Encounter: Payer: Self-pay | Admitting: Internal Medicine

## 2016-06-07 ENCOUNTER — Other Ambulatory Visit: Payer: Self-pay | Admitting: *Deleted

## 2016-06-07 ENCOUNTER — Other Ambulatory Visit (INDEPENDENT_AMBULATORY_CARE_PROVIDER_SITE_OTHER): Payer: Self-pay

## 2016-06-07 ENCOUNTER — Ambulatory Visit: Payer: Self-pay

## 2016-06-07 DIAGNOSIS — B20 Human immunodeficiency virus [HIV] disease: Secondary | ICD-10-CM

## 2016-06-07 MED ORDER — DARUNAVIR ETHANOLATE 800 MG PO TABS: 800.0000 mg | ORAL_TABLET | Freq: Every day | ORAL | 5 refills | Status: DC

## 2016-06-07 MED ORDER — ELVITEG-COBIC-EMTRICIT-TENOFAF 150-150-200-10 MG PO TABS: 1.0000 | ORAL_TABLET | Freq: Every day | ORAL | 5 refills | Status: DC

## 2016-06-08 LAB — T-HELPER CELL (CD4) - (RCID CLINIC ONLY)
CD4 T CELL HELPER: 21 % — AB (ref 33–55)
CD4 T Cell Abs: 550 /uL (ref 400–2700)

## 2016-06-10 LAB — HIV-1 RNA QUANT-NO REFLEX-BLD
HIV 1 RNA Quant: <20 copies/mL
HIV-1 RNA Quant, Log: <1.3 Log copies/mL

## 2016-07-06 ENCOUNTER — Ambulatory Visit (INDEPENDENT_AMBULATORY_CARE_PROVIDER_SITE_OTHER): Payer: Self-pay | Admitting: Internal Medicine

## 2016-07-06 ENCOUNTER — Encounter: Payer: Self-pay | Admitting: Internal Medicine

## 2016-07-06 VITALS — BP 128/85 | HR 84 | Temp 98.2°F | Wt 298.0 lb

## 2016-07-06 DIAGNOSIS — Z23 Encounter for immunization: Secondary | ICD-10-CM

## 2016-07-06 DIAGNOSIS — Z79899 Other long term (current) drug therapy: Secondary | ICD-10-CM

## 2016-07-06 DIAGNOSIS — B20 Human immunodeficiency virus [HIV] disease: Secondary | ICD-10-CM

## 2016-07-06 DIAGNOSIS — Z113 Encounter for screening for infections with a predominantly sexual mode of transmission: Secondary | ICD-10-CM

## 2016-07-06 DIAGNOSIS — F419 Anxiety disorder, unspecified: Secondary | ICD-10-CM

## 2016-07-07 NOTE — Assessment & Plan Note (Signed)
She continues to do well on her salvage regimen.  rtc 6 months.

## 2016-07-07 NOTE — Assessment & Plan Note (Signed)
She is not having any current issues.

## 2016-07-07 NOTE — Progress Notes (Signed)
Patient ID: [name], female   DOB: 03/18/1976, 41 y.o.   MRN: [id number]   Subjective:    Patient ID: [name], female    DOB: [birth year], 41 y.o.   MRN: [id number]  HPI:   Here for follow up of HIV.   She continues to do well on [place].  CD4 550, viral load undetectable.  She has no complaints.  No missed doses. No associated n/v/d.   Review of Systems  Constitutional: Negative for fatigue.  Gastrointestinal: Negative for diarrhea and nausea.  Skin: Negative for rash.       Objective:   Physical Exam  Constitutional: She appears well-developed and well-nourished. No distress.  HENT:  Mouth/Throat: No oropharyngeal exudate.  Cardiovascular: Normal rate, regular rhythm and normal heart sounds.   No murmur heard. Pulmonary/Chest: Effort normal and breath sounds normal. No respiratory distress. She has no wheezes.  Lymphadenopathy:    She has no cervical adenopathy.  Skin: No rash noted.           Lab Results  Component Value Date   HIV1RNAQUANT <20 NOT DETECTED 06/07/2016   HIV1RNAQUANT <20 11/15/2015   HIV1RNAQUANT <20 12/08/2014     Assessment & Plan:    HPI

## 2016-07-28 ENCOUNTER — Telehealth: Payer: Self-pay

## 2016-07-28 ENCOUNTER — Other Ambulatory Visit: Payer: Self-pay

## 2016-07-28 DIAGNOSIS — B20 Human immunodeficiency virus [HIV] disease: Secondary | ICD-10-CM

## 2016-07-28 MED ORDER — ELVITEG-COBIC-EMTRICIT-TENOFAF 150-150-200-10 MG PO TABS: 1.0000 | ORAL_TABLET | Freq: Every day | ORAL | 5 refills | Status: DC

## 2016-07-28 MED ORDER — DARUNAVIR ETHANOLATE 800 MG PO TABS: 800.0000 mg | ORAL_TABLET | Freq: Every day | ORAL | 5 refills | Status: DC

## 2016-07-28 NOTE — Telephone Encounter (Signed)
Patient is calling since she is not sure where her medication refill will go.  Since she is ADAP this will go to the [facility] on [address].  I will send the Prezista and Genvoya to pharmacy.   Laverle Patter, RN

## 2016-07-28 NOTE — Telephone Encounter (Signed)
Scripts were printed instead of going electronic.  Sent electronic refill  Laverle Patter, RN

## 2016-07-28 NOTE — Addendum Note (Signed)
Addended by: Laverle Patter on: 07/28/2016 05:14 PM   Modules accepted: Orders

## 2016-09-18 DIAGNOSIS — I889 Nonspecific lymphadenitis, unspecified: Secondary | ICD-10-CM | POA: Diagnosis not present

## 2016-09-20 ENCOUNTER — Other Ambulatory Visit: Payer: Self-pay | Admitting: Family Medicine

## 2016-09-20 DIAGNOSIS — I889 Nonspecific lymphadenitis, unspecified: Secondary | ICD-10-CM

## 2016-09-22 ENCOUNTER — Ambulatory Visit
Admission: RE | Admit: 2016-09-22 | Discharge: 2016-09-22 | Disposition: A | Discharge status: Home / Self Care | Payer: BLUE CROSS/BLUE SHIELD | Source: Ambulatory Visit | Attending: Family Medicine | Admitting: Family Medicine

## 2016-09-22 DIAGNOSIS — R221 Localized swelling, mass and lump, neck: Secondary | ICD-10-CM | POA: Diagnosis not present

## 2016-09-22 DIAGNOSIS — I889 Nonspecific lymphadenitis, unspecified: Secondary | ICD-10-CM

## 2016-09-26 ENCOUNTER — Other Ambulatory Visit: Payer: Self-pay | Admitting: Family Medicine

## 2016-09-26 ENCOUNTER — Ambulatory Visit
Admission: RE | Admit: 2016-09-26 | Discharge: 2016-09-26 | Disposition: A | Discharge status: Home / Self Care | Payer: BLUE CROSS/BLUE SHIELD | Source: Ambulatory Visit | Attending: Family Medicine | Admitting: Family Medicine

## 2016-09-26 DIAGNOSIS — R221 Localized swelling, mass and lump, neck: Secondary | ICD-10-CM | POA: Diagnosis not present

## 2016-09-26 DIAGNOSIS — I889 Nonspecific lymphadenitis, unspecified: Secondary | ICD-10-CM

## 2016-09-26 MED ORDER — IOPAMIDOL (ISOVUE-300) INJECTION 61%
75.0000 mL | Freq: Once | INTRAVENOUS | Status: AC | PRN
  Administered 2016-09-26: 75 mL via INTRAVENOUS

## 2016-09-29 ENCOUNTER — Telehealth: Payer: Self-pay | Admitting: *Deleted

## 2016-09-29 NOTE — Telephone Encounter (Signed)
Patient called back and it was very bad reception. She said she had a CT of the neck, because she has been c/o swelling in her neck. Results are in EPIC and she said her PCP wanted her to follow up with Dr. Linus Salmons. I scheduled her an appt for next week on 10/03/16. I could not confirm because her phone cut off. Myrtis Hopping

## 2016-09-29 NOTE — Telephone Encounter (Signed)
Patient called and left a voice mail stating that this office should have received forms for her from [place]? The call was muffled as there was children in the background. Called back and got her voice mail and asked her to return the call. [name]

## 2016-10-03 ENCOUNTER — Encounter: Payer: Self-pay | Admitting: Internal Medicine

## 2016-10-03 ENCOUNTER — Ambulatory Visit (INDEPENDENT_AMBULATORY_CARE_PROVIDER_SITE_OTHER): Payer: BLUE CROSS/BLUE SHIELD | Admitting: Internal Medicine

## 2016-10-03 VITALS — BP 146/94 | HR 96 | Temp 99.3°F | Ht 70.0 in | Wt 298.0 lb

## 2016-10-03 DIAGNOSIS — B20 Human immunodeficiency virus [HIV] disease: Secondary | ICD-10-CM

## 2016-10-03 DIAGNOSIS — Z113 Encounter for screening for infections with a predominantly sexual mode of transmission: Secondary | ICD-10-CM

## 2016-10-03 DIAGNOSIS — R591 Generalized enlarged lymph nodes: Secondary | ICD-10-CM

## 2016-10-03 DIAGNOSIS — L988 Other specified disorders of the skin and subcutaneous tissue: Secondary | ICD-10-CM | POA: Insufficient documentation

## 2016-10-03 DIAGNOSIS — Z79899 Other long term (current) drug therapy: Secondary | ICD-10-CM

## 2016-10-03 DIAGNOSIS — R590 Localized enlarged lymph nodes: Secondary | ICD-10-CM | POA: Diagnosis not present

## 2016-10-03 DIAGNOSIS — R599 Enlarged lymph nodes, unspecified: Secondary | ICD-10-CM

## 2016-10-03 LAB — CBC WITH DIFFERENTIAL/PLATELET
Basophils Absolute: 99 cells/uL (ref 0–200)
Basophils Relative: 1 %
EOS PCT: 4 %
Eosinophils Absolute: 396 cells/uL (ref 15–500)
HCT: 41.8 % (ref 35.0–45.0)
Hemoglobin: 13.7 g/dL (ref 11.7–15.5)
Lymphocytes Relative: 26 %
Lymphs Abs: 2574 cells/uL (ref 850–3900)
MCH: 29.1 pg (ref 27.0–33.0)
MCHC: 32.8 g/dL (ref 32.0–36.0)
MCV: 88.7 fL (ref 80.0–100.0)
MPV: 9.3 fL (ref 7.5–12.5)
Monocytes Absolute: 693 cells/uL (ref 200–950)
Monocytes Relative: 7 %
NEUTROS PCT: 62 %
Neutro Abs: 6138 cells/uL (ref 1500–7800)
Platelets: 322 10*3/uL (ref 140–400)
RBC: 4.71 MIL/uL (ref 3.80–5.10)
RDW: 14.2 % (ref 11.0–15.0)
WBC: 9.9 10*3/uL (ref 3.8–10.8)

## 2016-10-03 NOTE — Assessment & Plan Note (Signed)
On a salvage regimen and doing very well.  I will recheck today to be sure it is not uncontrolled.  Otherwise can rtc 6 months.

## 2016-10-03 NOTE — Assessment & Plan Note (Signed)
Will check lipids today

## 2016-10-03 NOTE — Progress Notes (Signed)
   Subjective:    Patient ID: [name], female    DOB: 1975/09/25, 41 y.o.   MRN: [id number]  HPI Here for a work in visit at the request of her PCP. She has a history of HIV followed by me with a history of MAI in her blood and associated supraclavicular lymphadenopathy last on MAI treatment February 2015 with a good CD4 since that time and suppressed virus.  Last CD4 is 550 and viral load < 20.  She reports continued excellent compliance with the Genvoya and prezista with no missed doses.  About 1-2 months ago noted an outpouching of the site of her previous biopsy and has had intermittent drainage of it.     Review of Systems  Constitutional: Negative for fatigue, fever and unexpected weight change.  Skin: Negative for rash.  Neurological: Negative for dizziness.  Hematological: Negative for adenopathy.       Objective:   Physical Exam  Constitutional: She appears well-developed and well-nourished. No distress.  HENT:  Mouth/Throat: No oropharyngeal exudate.  Eyes: No scleral icterus.  Neck:  Left supraclavicular area with about 3/4 cm outpouching fleshy tender area; no drainage noted now  Lymphadenopathy:    She has no cervical adenopathy.   SH: no tobacco       Assessment & Plan:

## 2016-10-03 NOTE — Assessment & Plan Note (Signed)
History of MAI with supraclavicular node and will have the biopsy area evaluated by CVTS.  Done previously by Dr. Arlyce Dice.

## 2016-10-03 NOTE — Assessment & Plan Note (Signed)
Will screen today 

## 2016-10-04 LAB — COMPLETE METABOLIC PANEL WITH GFR
ALBUMIN: 4.1 g/dL (ref 3.6–5.1)
ALK PHOS: 85 U/L (ref 33–115)
ALT: 15 U/L (ref 6–29)
AST: 15 U/L (ref 10–30)
BILIRUBIN TOTAL: 0.3 mg/dL (ref 0.2–1.2)
BUN: 11 mg/dL (ref 7–25)
CALCIUM: 9.5 mg/dL (ref 8.6–10.2)
CO2: 28 mmol/L (ref 20–31)
CREATININE: 0.86 mg/dL (ref 0.50–1.10)
Chloride: 101 mmol/L (ref 98–110)
GFR, Est African American: >89 mL/min (ref >=60)
GFR, Est Non African American: 85 mL/min (ref >=60)
Glucose, Bld: 87 mg/dL (ref 65–99)
Potassium: 4.9 mmol/L (ref 3.5–5.3)
Sodium: 136 mmol/L (ref 135–146)
TOTAL PROTEIN: 7.4 g/dL (ref 6.1–8.1)

## 2016-10-04 LAB — LIPID PANEL
Cholesterol: 210 mg/dL — ABNORMAL HIGH (ref <200)
HDL: 46 mg/dL — ABNORMAL LOW (ref >50)
LDL CALC: 141 mg/dL — AB (ref <100)
TRIGLYCERIDES: 114 mg/dL (ref <150)
Total CHOL/HDL Ratio: 4.6 Ratio (ref <5.0)
VLDL: 23 mg/dL (ref <30)

## 2016-10-04 LAB — RPR

## 2016-10-05 LAB — T-HELPER CELL (CD4) - (RCID CLINIC ONLY)
CD4 % Helper T Cell: 26 % — ABNORMAL LOW (ref 33–55)
CD4 T Cell Abs: 650 /uL (ref 400–2700)

## 2016-10-05 LAB — HIV-1 RNA QUANT-NO REFLEX-BLD
HIV 1 RNA Quant: <20 copies/mL — AB
HIV-1 RNA Quant, Log: <1.3 Log copies/mL — AB

## 2016-10-24 ENCOUNTER — Encounter: Payer: BLUE CROSS/BLUE SHIELD | Admitting: Cardiothoracic Surgery

## 2016-10-24 ENCOUNTER — Telehealth: Payer: Self-pay | Admitting: *Deleted

## 2016-10-24 NOTE — Telephone Encounter (Signed)
Patient called stating she had an appointment with thoracic surgeon today and they called her and had to reschedule for next week. She wanted to know if we could call on her behalf and get her in sooner. Advised patient that unfortunately because they are able to get her in next week, that is most likely the soonest they can do. Asked if she was having any worsening symptoms and she said no. She will let us know if her appt is cancelled again.

## 2016-10-30 ENCOUNTER — Institutional Professional Consult (permissible substitution) (INDEPENDENT_AMBULATORY_CARE_PROVIDER_SITE_OTHER): Payer: BLUE CROSS/BLUE SHIELD | Admitting: Cardiothoracic Surgery

## 2016-10-30 ENCOUNTER — Encounter: Payer: Self-pay | Admitting: Cardiothoracic Surgery

## 2016-10-30 VITALS — BP 138/85 | HR 86 | Resp 20 | Ht 70.0 in

## 2016-10-30 DIAGNOSIS — R590 Localized enlarged lymph nodes: Secondary | ICD-10-CM | POA: Diagnosis not present

## 2016-10-30 NOTE — Progress Notes (Signed)
[address]       [place] [address]             [phone]                    [name] [place] Medical Record #[id number] Date of Birth: 1975-06-25  Referring: Thayer Headings, MD Primary Care: Jonathon Jordan, MD  Chief Complaint:    Chief Complaint  Patient presents with  . Adenopathy    Surgical eval on left supraclavicular adenopathy, US SOFT TISSUE HEAD AND NECK 09/22/16, Neck CT 09/26/16, HX of BX 07/28/11      History of Present Illness:    [name] 41 y.o. female is seen in the office  today for Chronic draining from the neck biopsy site. April 2013 patient underwent left supraclavicular lymph node biopsy by Dr. Arlyce Dice at that time pathology of the left supraclavicular node showed necrotizing granulomatous lymphadenitis special stains for microorganisms including AFB and GMS were negative there was no evidence of malignancy subsequent culture of the nodal tissue was positive for Mycobacterium avium intracellulare complex. The patient notes that she took antibiotic by mouth for a year but does not remember what they were. She done well until the labs several months when the incision of the previous noted biopsy starting getting puffy and having intermittent drainage. Ultrasound and CT scans have been done and the patient was referred to the thoracic surgery office.  She denies any fever chills night sweats. She's had no weight loss .       Current Activity/ Functional Status:  Patient is independent with mobility/ambulation, transfers, ADL's, IADL's.   Zubrod Score: At the time of surgery this patient's most appropriate activity status/level should be described as: _0     0    Normal activity, no symptoms _1     1    Restricted in physical strenuous activity but ambulatory, able to do out light work _2     2    Ambulatory and capable of self care, unable to do work activities, up and about               >50 % of waking hours                               _3     3    Only limited self care, in bed greater than 50% of waking hours _4     4    Completely disabled, no self care, confined to bed or chair _5     5    Moribund   Past Medical History:  Diagnosis Date  . Anxiety   . Candidal esophagitis ([place])   . HIV (human immunodeficiency virus infection) ([place])   . MAC (mycobacterium avium-intracellulare complex)    positive culture, treated for 3 months    Past Surgical History:  Procedure Laterality Date  . ANTERIOR CRUCIATE LIGAMENT REPAIR     Right and left repair  . NASAL RECONSTRUCTION  1982  . [place] NODE BIOPSY  07/28/2011   Procedure: SUPRACLAVICAL NODE BIOPSY;  Surgeon: Nicanor Alcon, MD;  Location: [facility];  Service: Thoracic;  Laterality: Left;  . TONSILLECTOMY      Family History  Problem Relation Age of Onset  . Anesthesia problems Neg Hx   . Hypotension Neg Hx   . Malignant hyperthermia Neg Hx   . Pseudochol deficiency  Neg Hx     Social History   Social History  . Marital status: Single    Spouse name: N/A  . Number of children: N/A  . Years of education: N/A   Occupational History  . Not on file.   Social History Main Topics  . Smoking status: Never Smoker  . Smokeless tobacco: Never Used  . Alcohol use Yes     Comment: socially  . Drug use: No  . Sexual activity: Not Currently    Partners: Female     Comment: pt. declined condoms   Other Topics Concern  . Not on file   Social History Narrative  . No narrative on file    History  Smoking Status  . Never Smoker  Smokeless Tobacco  . Never Used    History  Alcohol Use  . Yes    Comment: socially     No Known Allergies  Current Outpatient Prescriptions  Medication Sig Dispense Refill  . ALPRAZolam (XANAX) 0.25 MG tablet Take 0.25 mg by mouth 3 (three) times daily as needed. Dr. Stephanie Acre    . cetirizine (ZYRTEC) 10 MG tablet Take 10 mg by mouth daily.    . darunavir (PREZISTA) 800 MG tablet Take 1 tablet (800  mg total) by mouth daily. 30 tablet 5  . elvitegravir-cobicistat-emtricitabine-tenofovir (GENVOYA) 150-150-200-10 MG TABS tablet Take 1 tablet by mouth daily. 30 tablet 5  . Multiple Vitamin (MULTIVITAMIN) capsule Take 1 capsule by mouth daily.      Marland Kitchen zolpidem (AMBIEN) 10 MG tablet Take 10 mg by mouth at bedtime as needed for sleep.     No current facility-administered medications for this visit.     Pertinent items are noted in HPI.   Review of Systems:     Cardiac Review of Systems: Y or N  Chest Pain [ n   ]  Resting SOB [   n] Exertional SOB  [ n ]  Orthopnea [ n ]   Pedal Edema [  n ]    Palpitations [n  ] Syncope  [n  ]   Presyncope [ n  ]  General Review of Systems: [Y] = yes [  ]=no Constitional: recent weight change [n  ];  Wt loss over the last 3 months [   ] anorexia [  ]; fatigue [  ]; nausea [  ]; night sweats [  ]; fever [  ]; or chills [  ];          Dental: poor dentition[  ]; Last Dentist visit:   Eye : blurred vision [n  ]; diplopia [   ]; vision changes [  ];  Amaurosis fugax[  ]; Resp: cough [  ];  wheezing[  ];  hemoptysis[  ]; shortness of breath[  ]; paroxysmal nocturnal dyspnea[  ]; dyspnea on exertion[  ]; or orthopnea[  ];  GI:  gallstones[  ], vomiting[  ];  dysphagia[  ]; melena[  ];  hematochezia [  ]; heartburn[  ];   Hx of  Colonoscopy[  ]; GU: kidney stones [  ]; hematuria[  ];   dysuria [  ];  nocturia[  ];  history of     obstruction [  ]; urinary frequency [  ]             Skin: rash, swelling[  ];, hair loss[  ];  peripheral edema[  ];  or itching[  ]; Musculosketetal: myalgias[  ];  joint swelling[  ];  joint erythema[  ];  joint pain[  ];  back pain[  ];  Heme/Lymph: bruising[ n ];  bleeding[n  ];  anemia[  ];  Neuro: TIA[  ];  headaches[  ];  stroke[  ];  vertigo[  ];  seizures[  ];   paresthesias[  ];  difficulty walking[n  ];  Psych:depression[  ]; anxiety[  ];  Endocrine: diabetes[  ];  thyroid dysfunction[  ];  Immunizations: Flu up to date Blue.Reese   ]; Pneumococcal up to date [  y];  Other:  Physical Exam: BP 138/85   Pulse 86   Resp 20   Ht _0  (1.778 m)   SpO2 98% Comment: RA  PHYSICAL EXAMINATION: General appearance: alert, cooperative and no distress Head: Normocephalic, without obvious abnormality, atraumatic Neck: no adenopathy, no carotid bruit, no JVD, supple, symmetrical, trachea midline and thyroid not enlarged, symmetric, no tenderness/mass/nodules Lymph nodes: Cervical, supraclavicular, and axillary nodes normal. Resp: clear to auscultation bilaterally Back: symmetric, no curvature. ROM normal. No CVA tenderness. Cardio: regular rate and rhythm, S1, S2 normal, no murmur, click, rub or gallop GI: soft, non-tender; bowel sounds normal; no masses,  no organomegaly Extremities: extremities normal, atraumatic, no cyanosis or edema Neurologic: Grossly normal  Although no definite lymph nodes are palpable no neck the left neck is somewhat fuller than the right. The patient has a area of thickening at the previous biopsy site. Patient does note that it drains on a regular basis .    Diagnostic Studies & Laboratory data:     Recent Radiology Findings:  No results found. Study Result   CLINICAL DATA:  Supraclavicular lymph node swelling for 3 weeks. Pain at the site of previous nodal biopsy in 2013, which started draining this morning. HIV and history of MAC infection.  These results will be called to the ordering clinician or representative by the Radiology Department at the imaging location.  EXAM: CT NECK WITH CONTRAST  TECHNIQUE: Multidetector CT imaging of the neck was performed using the standard protocol following the bolus administration of intravenous contrast.  CONTRAST:  31m ISOVUE-300 IOPAMIDOL (ISOVUE-300) INJECTION 61%  COMPARISON:  07/20/2011  FINDINGS: Pharynx and larynx: Mild symmetric nasopharyngeal thickening. No focal mass lesion or inflammation noted.  Salivary glands:  Chronic bilateral parotid calcifications. No acute inflammation or mass. No cysts or nodules are noted.  Thyroid: Negative  Lymph nodes: There is lymphadenopathy at the left thoracic inlet with nodes measuring up to 16 mm in diameter. Adenopathy has improved since prior. Contiguous with the nodes is an area of more low-density abnormality extending superiorly and laterally across the posterior margin of the sternocleidomastoid, reaching the skin surface and correlating with history of drainage. The subcutaneous 28 mm fluid collection seen on recent sonography appears more decompressed today, correlating with the interval drainage.  Vascular: No acute finding.  Aberrant right subclavian artery.  Limited intracranial: Negative  Visualized orbits: Not seen  Mastoids and visualized paranasal sinuses: Presumed mucous retention cysts in the maxillary sinuses.  Skeleton: No acute or aggressive finding. Atlanto occipital non segmentation with hypoplastic posterior arch of C1.  Upper chest: Negative  IMPRESSION: Chronic left supraclavicular adenopathy with history of biopsy and positive mycobacterial culture in 2013. Adenopathy is improved since 2013 but there is a sinus tract from the nodal conglomerate to the left neck skin. Associated subcutaneous fluid collection seen on sonography in 09/22/2016 has partially decompressed.   Electronically Signed   By: JMonte FantasiaM.D.   On: 09/26/2016  17:07    I have independently reviewed the above radiologic studies.  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 With the patient's previous history is most likely the this is a recurrence of the same previous infection . I discussed with the patient and offered her proceeding tomorrow with II & D  drainage and biopsy of the area , and leaving a chronic drain in the site until it well-healed . Depending on repeat cultures she will need aggressive treatment or is very unlikely that this chronic draining will stop .  the patient did not want to proceed with drainage tomorrow but is willing to come back the last week in July.     I  spent 25 minutes counseling the patient face to face and 50% or more the  time was spent in counseling and coordination of care. The total time spent in the appointment was 40 minutes.  [name] MD      [address] [place] [address] Office [phone]   Beeper [phone]  10/30/2016 3:38 PM

## 2016-10-31 ENCOUNTER — Other Ambulatory Visit: Payer: Self-pay

## 2016-10-31 DIAGNOSIS — R590 Localized enlarged lymph nodes: Secondary | ICD-10-CM

## 2016-11-08 ENCOUNTER — Other Ambulatory Visit (HOSPITAL_COMMUNITY): Payer: Self-pay | Admitting: *Deleted

## 2016-11-08 NOTE — Pre-Procedure Instructions (Signed)
    [name]  11/08/2016      [facility] [place] [facility] [address] AT [facility] [address] [place] [address] Phone: [phone] Fax: [phone]  [place], [place] [address] [place] [address] Phone: [phone] Fax: [phone]  [facility] [place], [address] [place] [address] Phone: [phone] Fax: [phone]    Your procedure is scheduled on Tuesday, November 14, 2016 at 11:00 AM.   Report to [facility] Entrance "A" Admitting Office at 9;00 AM.   Call this number if you have problems the morning of surgery: [phone]   Questions prior to day of surgery, please call [phone] between 8 & 4 PM.   Remember:  Do not eat food or drink liquids after midnight Monday, 11/13/16.  Take these medicines the morning of surgery with A SIP OF WATER: Prezista, Genvoya, Alprazolam (Xanax) - if needed   Do not wear jewelry, make-up or nail polish.  Do not wear lotions, powders, perfumes or deodrant.  Do not shave 48 hours prior to surgery.    Do not bring valuables to the hospital.  [facility] is not responsible for any belongings or valuables.  Contacts, dentures or bridgework may not be worn into surgery.  Leave your suitcase in the car.  After surgery it may be brought to your room.  For patients admitted to the hospital, discharge time will be determined by your treatment team.  Patients discharged the day of surgery will not be allowed to drive home.   See "Preparing for Surgery" Instruction sheet.   Please read over the fact sheets that you were given.

## 2016-11-09 ENCOUNTER — Encounter (HOSPITAL_COMMUNITY): Payer: Self-pay

## 2016-11-09 ENCOUNTER — Encounter (HOSPITAL_COMMUNITY)
Admission: RE | Admit: 2016-11-09 | Discharge: 2016-11-09 | Disposition: A | Discharge status: Home / Self Care | Payer: BLUE CROSS/BLUE SHIELD | Source: Ambulatory Visit | Attending: Cardiothoracic Surgery | Admitting: Cardiothoracic Surgery

## 2016-11-09 DIAGNOSIS — R590 Localized enlarged lymph nodes: Secondary | ICD-10-CM | POA: Insufficient documentation

## 2016-11-09 HISTORY — DX: Family history of other specified conditions: Z84.89

## 2016-11-09 HISTORY — DX: Adverse effect of unspecified anesthetic, initial encounter: T41.45XA

## 2016-11-09 LAB — COMPREHENSIVE METABOLIC PANEL
ALT: 16 U/L (ref 14–54)
AST: 19 U/L (ref 15–41)
Albumin: 3.8 g/dL (ref 3.5–5.0)
Alkaline Phosphatase: 79 U/L (ref 38–126)
Anion gap: 8 (ref 5–15)
BUN: 9 mg/dL (ref 6–20)
CO2: 26 mmol/L (ref 22–32)
Calcium: 9.3 mg/dL (ref 8.9–10.3)
Chloride: 102 mmol/L (ref 101–111)
Creatinine, Ser: 0.82 mg/dL (ref 0.44–1.00)
GFR calc Af Amer: >60 mL/min (ref >60)
GFR calc non Af Amer: >60 mL/min (ref >60)
Glucose, Bld: 106 mg/dL — ABNORMAL HIGH (ref 65–99)
Potassium: 4.4 mmol/L (ref 3.5–5.1)
Sodium: 136 mmol/L (ref 135–145)
Total Bilirubin: 0.6 mg/dL (ref 0.3–1.2)
Total Protein: 7 g/dL (ref 6.5–8.1)

## 2016-11-09 LAB — CBC
HCT: 43 % (ref 36.0–46.0)
Hemoglobin: 14 g/dL (ref 12.0–15.0)
MCH: 28.7 pg (ref 26.0–34.0)
MCHC: 32.6 g/dL (ref 30.0–36.0)
MCV: 88.1 fL (ref 78.0–100.0)
Platelets: 319 10*3/uL (ref 150–400)
RBC: 4.88 MIL/uL (ref 3.87–5.11)
RDW: 13.7 % (ref 11.5–15.5)
WBC: 8.9 10*3/uL (ref 4.0–10.5)

## 2016-11-09 LAB — HCG, SERUM, QUALITATIVE: Preg, Serum: NEGATIVE

## 2016-11-09 LAB — PROTIME-INR
INR: 0.99
Prothrombin Time: 13.1 seconds (ref 11.4–15.2)

## 2016-11-09 LAB — APTT: aPTT: 31 seconds (ref 24–36)

## 2016-11-09 NOTE — Progress Notes (Addendum)
PCP: Dr. Jonathon Jordan @ [facility].  Pt. Reported 2008 appendectomy surgery oxygen  sat's were low and spent night in ICU, 2013 surgery (Dr. Arlyce Dice) sats were also low in PACU. Both surgeries done @ [facility].

## 2016-11-10 NOTE — Progress Notes (Signed)
Anesthesia Chart Review: Patient is a 41 year old female scheduled for left cervical node incision and drainage, biopsy left neck on 11/14/16. Anesthesia is posted for local.   History includes never smoker, HIV (diagnosed 12/2010), Mycobacterium avium intracellular complex (MAC) s/p treatment X 3 monthss (last completed therapy 05/2013), candidal esophagitis '12, anxiety, nasal reconstruction '82, turbinate reduction/tonsillectomy/UPPP 09/26/07, left supraclavicular node biopsy 07/28/11. For anesthesia complications, she reported hypoxemia in the PACU following surgeries and ICU stay following tonsillectomy in 2009.   PCP is Dr. Jonathon Jordan. ID is Dr. Scharlene Gloss.  Meds include Zyrtec, Prezista, Genvoya, Ambien, Xanax.  BP 130/81   Pulse 82   Temp 36.8 C   Resp 20   Ht _0  (1.778 m)   Wt 297 lb 6.4 oz (134.9 kg)   LMP 10/23/2016   SpO2 99%   BMI 42.67 kg/m   CT neck 09/26/16: IMPRESSION: Chronic left supraclavicular adenopathy with history of biopsy and positive mycobacterial culture in 2013. Adenopathy is improved since 2013 but there is a sinus tract from the nodal conglomerate to the left neck skin. Associated subcutaneous fluid collection seen on sonography in 09/22/2016 has partially decompressed.  Preoperative labs noted. Serum pregnancy test is negative.  If no acute changes then I anticipate that she can proceed as planned.  George Hugh [facility] Short Stay Center/Anesthesiology Phone [phone] 11/10/2016 11:59 AM

## 2016-11-14 ENCOUNTER — Encounter (HOSPITAL_COMMUNITY): Payer: Self-pay | Admitting: Certified Registered Nurse Anesthetist

## 2016-11-14 ENCOUNTER — Ambulatory Visit (HOSPITAL_COMMUNITY): Payer: BLUE CROSS/BLUE SHIELD | Admitting: Vascular Surgery

## 2016-11-14 ENCOUNTER — Ambulatory Visit (HOSPITAL_COMMUNITY): Payer: BLUE CROSS/BLUE SHIELD | Admitting: Registered Nurse

## 2016-11-14 ENCOUNTER — Ambulatory Visit (HOSPITAL_COMMUNITY)
Admission: RE | Admit: 2016-11-14 | Discharge: 2016-11-14 | Disposition: A | Discharge status: Home / Self Care | Payer: BLUE CROSS/BLUE SHIELD | Source: Ambulatory Visit | Attending: Cardiothoracic Surgery | Admitting: Cardiothoracic Surgery

## 2016-11-14 ENCOUNTER — Other Ambulatory Visit (HOSPITAL_COMMUNITY)
Admission: RE | Admit: 2016-11-14 | Discharge: 2016-11-14 | Disposition: A | Discharge status: Home / Self Care | Payer: BLUE CROSS/BLUE SHIELD | Source: Ambulatory Visit | Attending: Internal Medicine | Admitting: Internal Medicine

## 2016-11-14 ENCOUNTER — Ambulatory Visit (HOSPITAL_COMMUNITY): Payer: BLUE CROSS/BLUE SHIELD

## 2016-11-14 ENCOUNTER — Encounter (HOSPITAL_COMMUNITY)
Admission: RE | Disposition: A | Discharge status: Home / Self Care | Payer: Self-pay | Source: Ambulatory Visit | Attending: Cardiothoracic Surgery

## 2016-11-14 DIAGNOSIS — B20 Human immunodeficiency virus [HIV] disease: Secondary | ICD-10-CM | POA: Diagnosis not present

## 2016-11-14 DIAGNOSIS — I888 Other nonspecific lymphadenitis: Secondary | ICD-10-CM | POA: Diagnosis not present

## 2016-11-14 DIAGNOSIS — F419 Anxiety disorder, unspecified: Secondary | ICD-10-CM | POA: Insufficient documentation

## 2016-11-14 DIAGNOSIS — R599 Enlarged lymph nodes, unspecified: Secondary | ICD-10-CM | POA: Diagnosis not present

## 2016-11-14 DIAGNOSIS — D367 Benign neoplasm of other specified sites: Secondary | ICD-10-CM | POA: Insufficient documentation

## 2016-11-14 DIAGNOSIS — R590 Localized enlarged lymph nodes: Secondary | ICD-10-CM | POA: Insufficient documentation

## 2016-11-14 DIAGNOSIS — Z6841 Body Mass Index (BMI) 40.0 and over, adult: Secondary | ICD-10-CM | POA: Insufficient documentation

## 2016-11-14 DIAGNOSIS — Z79899 Other long term (current) drug therapy: Secondary | ICD-10-CM | POA: Diagnosis not present

## 2016-11-14 DIAGNOSIS — L040 Acute lymphadenitis of face, head and neck: Secondary | ICD-10-CM | POA: Diagnosis not present

## 2016-11-14 HISTORY — PX: LYMPH NODE BIOPSY: SHX201

## 2016-11-14 SURGERY — LYMPH NODE BIOPSY
Anesthesia: Monitor Anesthesia Care | Site: Neck | Laterality: Left

## 2016-11-14 MED ORDER — PROPOFOL 10 MG/ML IV BOLUS
INTRAVENOUS | Status: AC
  Filled 2016-11-14: qty 40

## 2016-11-14 MED ORDER — 0.9 % SODIUM CHLORIDE (POUR BTL) OPTIME
TOPICAL | Status: DC | PRN
  Administered 2016-11-14: 1000 mL

## 2016-11-14 MED ORDER — MIDAZOLAM HCL 2 MG/2ML IJ SOLN
INTRAMUSCULAR | Status: AC
  Filled 2016-11-14: qty 2

## 2016-11-14 MED ORDER — FENTANYL CITRATE (PF) 250 MCG/5ML IJ SOLN
INTRAMUSCULAR | Status: AC
  Filled 2016-11-14: qty 5

## 2016-11-14 MED ORDER — LACTATED RINGERS IV SOLN
INTRAVENOUS | Status: DC
  Administered 2016-11-14: 10:00:00 via INTRAVENOUS

## 2016-11-14 MED ORDER — HYDROCODONE-ACETAMINOPHEN 5-325 MG PO TABS: 1.0000 | ORAL_TABLET | Freq: Four times a day (QID) | ORAL | 0 refills | Status: DC | PRN

## 2016-11-14 MED ORDER — PROPOFOL 500 MG/50ML IV EMUL
INTRAVENOUS | Status: DC | PRN
  Administered 2016-11-14: 50 ug/kg/min via INTRAVENOUS

## 2016-11-14 MED ORDER — PROMETHAZINE HCL 25 MG/ML IJ SOLN: 6.2500 mg | INTRAMUSCULAR | Status: DC | PRN

## 2016-11-14 MED ORDER — LIDOCAINE HCL (PF) 1 % IJ SOLN
INTRAMUSCULAR | Status: AC
  Filled 2016-11-14: qty 30

## 2016-11-14 MED ORDER — MIDAZOLAM HCL 2 MG/2ML IJ SOLN: 0.5000 mg | Freq: Once | INTRAMUSCULAR | Status: DC | PRN

## 2016-11-14 MED ORDER — PROPOFOL 10 MG/ML IV BOLUS
INTRAVENOUS | Status: DC | PRN
  Administered 2016-11-14: 20 mg via INTRAVENOUS

## 2016-11-14 MED ORDER — MEPERIDINE HCL 25 MG/ML IJ SOLN: 6.2500 mg | INTRAMUSCULAR | Status: DC | PRN

## 2016-11-14 MED ORDER — FENTANYL CITRATE (PF) 100 MCG/2ML IJ SOLN: 25.0000 ug | INTRAMUSCULAR | Status: DC | PRN

## 2016-11-14 MED ORDER — LACTATED RINGERS IV SOLN
INTRAVENOUS | Status: DC | PRN
Start: 2016-11-14 — End: 2016-11-14
  Administered 2016-11-14: 10:00:00 via INTRAVENOUS

## 2016-11-14 MED ORDER — MIDAZOLAM HCL 5 MG/5ML IJ SOLN
INTRAMUSCULAR | Status: DC | PRN
  Administered 2016-11-14: 2 mg via INTRAVENOUS

## 2016-11-14 MED ORDER — LIDOCAINE HCL (PF) 1 % IJ SOLN
INTRAMUSCULAR | Status: DC | PRN
  Administered 2016-11-14: 30 mL

## 2016-11-14 MED ORDER — FENTANYL CITRATE (PF) 100 MCG/2ML IJ SOLN
INTRAMUSCULAR | Status: DC | PRN
  Administered 2016-11-14 (×3): 50 ug via INTRAVENOUS

## 2016-11-14 SURGICAL SUPPLY — 38 items
BANDAGE ACE 4X5 VEL STRL LF (GAUZE/BANDAGES/DRESSINGS) IMPLANT
BANDAGE ACE 6X5 VEL STRL LF (GAUZE/BANDAGES/DRESSINGS) IMPLANT
BNDG GAUZE ELAST 4 BULKY (GAUZE/BANDAGES/DRESSINGS) IMPLANT
CANISTER SUCT 3000ML PPV (MISCELLANEOUS) ×2 IMPLANT
COVER SURGICAL LIGHT HANDLE (MISCELLANEOUS) ×3 IMPLANT
DRSG AQUACEL AG ADV 3.5X14 (GAUZE/BANDAGES/DRESSINGS) ×1 IMPLANT
ELECT REM PT RETURN 9FT ADLT (ELECTROSURGICAL) ×2
ELECTRODE REM PT RTRN 9FT ADLT (ELECTROSURGICAL) ×1 IMPLANT
GAUZE SPONGE 4X4 12PLY STRL (GAUZE/BANDAGES/DRESSINGS) ×2 IMPLANT
GAUZE SPONGE 4X4 12PLY STRL LF (GAUZE/BANDAGES/DRESSINGS) ×1 IMPLANT
GLOVE BIO SURGEON STRL SZ 6.5 (GLOVE) ×2 IMPLANT
GLOVE BIOGEL PI IND STRL 6.5 (GLOVE) IMPLANT
GLOVE BIOGEL PI IND STRL 7.0 (GLOVE) IMPLANT
GLOVE BIOGEL PI INDICATOR 6.5 (GLOVE) ×2
GLOVE BIOGEL PI INDICATOR 7.0 (GLOVE) ×1
GOWN STRL REUS W/ TWL LRG LVL3 (GOWN DISPOSABLE) ×2 IMPLANT
GOWN STRL REUS W/TWL LRG LVL3 (GOWN DISPOSABLE) ×4
KIT BASIN OR (CUSTOM PROCEDURE TRAY) ×2 IMPLANT
KIT ROOM TURNOVER OR (KITS) ×2 IMPLANT
NDL HYPO 25GX1X1/2 BEV (NEEDLE) IMPLANT
NEEDLE HYPO 25GX1X1/2 BEV (NEEDLE) ×2 IMPLANT
NS IRRIG 1000ML POUR BTL (IV SOLUTION) ×2 IMPLANT
PACK GENERAL/GYN (CUSTOM PROCEDURE TRAY) ×2 IMPLANT
PACK UNIVERSAL I (CUSTOM PROCEDURE TRAY) ×2 IMPLANT
PAD ARMBOARD 7.5X6 YLW CONV (MISCELLANEOUS) ×4 IMPLANT
SPECIMEN JAR SMALL (MISCELLANEOUS) ×1 IMPLANT
STAPLER VISISTAT 35W (STAPLE) IMPLANT
SUT VIC AB 2-0 CTX 36 (SUTURE) IMPLANT
SUT VIC AB 3-0 SH 27 (SUTURE)
SUT VIC AB 3-0 SH 27X BRD (SUTURE) IMPLANT
SUT VIC AB 3-0 X1 27 (SUTURE) ×2 IMPLANT
SWAB COLLECTION DEVICE MRSA (MISCELLANEOUS) ×1 IMPLANT
SWAB CULTURE LIQUID MINI MALE (MISCELLANEOUS) ×1 IMPLANT
SYR CONTROL 10ML LL (SYRINGE) ×1 IMPLANT
TAPE CLOTH SURG 4X10 WHT LF (GAUZE/BANDAGES/DRESSINGS) ×1 IMPLANT
TOWEL OR 17X24 6PK STRL BLUE (TOWEL DISPOSABLE) ×1 IMPLANT
TOWEL OR 17X26 10 PK STRL BLUE (TOWEL DISPOSABLE) ×2 IMPLANT
WATER STERILE IRR 1000ML POUR (IV SOLUTION) ×2 IMPLANT

## 2016-11-14 NOTE — Brief Op Note (Signed)
      [address]       [place] [address]             [phone]    11/14/2016  10:46 AM  PATIENT:  [name]  41 y.o. female  PRE-OPERATIVE DIAGNOSIS:   Left supraclavicular adenopathy and Draining fistula from previous Biopsy    POST-OPERATIVE DIAGNOSIS:  supraclavicular adenopathy  PROCEDURE:  Procedure(s): Left Cervical node I/D (incision and drain) and Biopsy (Left)  SURGEON:  Surgeon(s) and Role:    [name], MD - Primary    ANESTHESIA:   MAC  EBL:  No intake/output data recorded.  BLOOD ADMINISTERED:none  DRAINS: none   LOCAL MEDICATIONS USED:  XYLOCAINE  and Amount: 10 ml  SPECIMEN:  Source of Specimen:  left neck  DISPOSITION OF SPECIMEN:  and micro  COUNTS:  YES  TOURNIQUET:  * No tourniquets in log *  DICTATION: .Dragon Dictation  PLAN OF CARE: Discharge to home after PACU  PATIENT DISPOSITION:  PACU - hemodynamically stable.   Delay start of Pharmacological VTE agent (>24hrs) due to surgical blood loss or risk of bleeding: yes

## 2016-11-14 NOTE — H&P (Signed)
[facility] Record #[id number] Date of Birth: 02/23/1976  Referring: Thayer Headings, MD Primary Care: Jonathon Jordan, MD  Chief Complaint:        Chief Complaint  Patient presents with  . Adenopathy    Surgical eval on left supraclavicular adenopathy, US SOFT TISSUE HEAD AND NECK 09/22/16, Neck CT 09/26/16, HX of BX 07/28/11      History of Present Illness:    [name] 41 y.o. female is seen in the office  for Chronic draining from the neck biopsy site. April 2013 patient underwent left supraclavicular lymph node biopsy by Dr. Arlyce Dice at that time pathology of the left supraclavicular node showed necrotizing granulomatous lymphadenitis special stains for microorganisms including AFB and GMS were negative,  there was no evidence of malignancy subsequent culture of the nodal tissue was positive for Mycobacterium avium intracellulare complex. The patient notes that she took antibiotic by mouth for a year but does not remember name . She had done well until several months when the incision of the previous noted biopsy starting getting puffy and having intermittent drainage. Ultrasound and CT scans have been done and the patient was referred to the thoracic surgery office for repeat biopsy   She denies any fever chills night sweats. She's had no weight loss .       Current Activity/ Functional Status:  Patient is independent with mobility/ambulation, transfers, ADL's, IADL's.   Zubrod Score: At the time of surgery this patient's most appropriate activity status/level should be described as: _0     0    Normal activity, no symptoms _1     1    Restricted in physical strenuous activity but ambulatory, able to do out light work _2     2    Ambulatory and capable of self care, unable to do work activities, up and about               >50 % of waking hours                              _3     3    Only limited self  care, in bed greater than 50% of waking hours _4     4    Completely disabled, no self care, confined to bed or chair _5     5    Moribund       Past Medical History:  Diagnosis Date  . Anxiety   . Candidal esophagitis ([place])   . HIV (human immunodeficiency virus infection) ([place])   . MAC (mycobacterium avium-intracellulare complex)    positive culture, treated for 3 months         Past Surgical History:  Procedure Laterality Date  . ANTERIOR CRUCIATE LIGAMENT REPAIR     Right and left repair  . NASAL RECONSTRUCTION  1982  . [place] NODE BIOPSY  07/28/2011   Procedure: SUPRACLAVICAL NODE BIOPSY;  Surgeon: Nicanor Alcon, MD;  Location: [facility];  Service: Thoracic;  Laterality: Left;  . TONSILLECTOMY           Family History  Problem Relation Age of Onset  . Anesthesia problems Neg Hx   . Hypotension Neg Hx   . Malignant hyperthermia Neg Hx   . Pseudochol deficiency Neg Hx     Social History        Social History  . Marital status: Single    Spouse name: N/A  .  Number of children: N/A  . Years of education: N/A      Occupational History  . Not on file.         Social History Main Topics  . Smoking status: Never Smoker  . Smokeless tobacco: Never Used  . Alcohol use Yes     Comment: socially  . Drug use: No  . Sexual activity: Not Currently    Partners: Female     Comment: pt. declined condoms       Other Topics Concern  . Not on file      Social History Narrative  . No narrative on file    History  Smoking Status  . Never Smoker  Smokeless Tobacco  . Never Used        History  Alcohol Use  . Yes    Comment: socially     No Known Allergies        Current Outpatient Prescriptions  Medication Sig Dispense Refill  . ALPRAZolam (XANAX) 0.25 MG tablet Take 0.25 mg by mouth 3 (three) times daily as needed. Dr. Stephanie Acre    . cetirizine (ZYRTEC) 10 MG tablet Take 10 mg by mouth daily.      . darunavir (PREZISTA) 800 MG tablet Take 1 tablet (800 mg total) by mouth daily. 30 tablet 5  . elvitegravir-cobicistat-emtricitabine-tenofovir (GENVOYA) 150-150-200-10 MG TABS tablet Take 1 tablet by mouth daily. 30 tablet 5  . Multiple Vitamin (MULTIVITAMIN) capsule Take 1 capsule by mouth daily.      Marland Kitchen zolpidem (AMBIEN) 10 MG tablet Take 10 mg by mouth at bedtime as needed for sleep.     No current facility-administered medications for this visit.     Pertinent items are noted in HPI.   Review of Systems:               Cardiac Review of Systems: Y or N             Chest Pain [ n   ]         Resting SOB [   n]      Exertional SOB  [ n ]   Orthopnea [ n ]             Pedal Edema [  n ]      Palpitations [n  ]          Syncope  [n  ]             Presyncope [ n  ]             General Review of Systems: [Y] = yes [  ]=no Constitional: recent weight change [n  ];  Wt loss over the last 3 months [   ] anorexia [  ]; fatigue [  ]; nausea [  ]; night sweats [  ]; fever [  ]; or chills [  ];          Dental: poor dentition[  ]; Last Dentist visit:              Eye : blurred vision [n  ]; diplopia [   ]; vision changes [  ];  Amaurosis fugax[  ]; Resp: cough [  ];  wheezing[  ];  hemoptysis[  ]; shortness of breath[  ]; paroxysmal nocturnal dyspnea[  ]; dyspnea on exertion[  ]; or orthopnea[  ];  GI:  gallstones[  ], vomiting[  ];  dysphagia[  ]; melena[  ];  hematochezia [  ]; heartburn[  ];   Hx of  Colonoscopy[  ]; GU: kidney stones [  ]; hematuria[  ];   dysuria [  ];  nocturia[  ];  history of     obstruction [  ]; urinary frequency [  ]             Skin: rash, swelling[  ];, hair loss[  ];  peripheral edema[  ];  or itching[  ]; Musculosketetal: myalgias[  ];  joint swelling[  ];  joint erythema[  ];  joint pain[  ];  back pain[  ];             Heme/Lymph: bruising[ n ];  bleeding[n  ];  anemia[  ];  Neuro: TIA[  ];  headaches[  ];  stroke[  ];  vertigo[  ];  seizures[  ];    paresthesias[  ];  difficulty walking[n  ];             Psych:depression[  ]; anxiety[  ];             Endocrine: diabetes[  ];  thyroid dysfunction[  ];             Immunizations: Flu up to date Blue.Reese  ]; Pneumococcal up to date [  y];             Other:  Physical Exam: BP 138/85   Pulse 86   Resp 20   Ht _0  (1.778 m)   SpO2 98% Comment: RA  PHYSICAL EXAMINATION: General appearance: alert, cooperative and no distress Head: Normocephalic, without obvious abnormality, atraumatic Neck: no adenopathy, no carotid bruit, no JVD, supple, symmetrical, trachea midline and thyroid not enlarged, symmetric, no tenderness/mass/nodules Lymph nodes: Cervical, supraclavicular, and axillary nodes normal. Resp: clear to auscultation bilaterally Back: symmetric, no curvature. ROM normal. No CVA tenderness. Cardio: regular rate and rhythm, S1, S2 normal, no murmur, click, rub or gallop GI: soft, non-tender; bowel sounds normal; no masses,  no organomegaly Extremities: extremities normal, atraumatic, no cyanosis or edema Neurologic: Grossly normal  Although no definite lymph nodes are palpable no neck the left neck is somewhat fuller than the right. The patient has a area of thickening at the previous biopsy site. Patient does note that it drains on a regular basis .    Diagnostic Studies & Laboratory data:     Recent Radiology Findings:  Imaging Results  No results found.   Study Result   CLINICAL DATA: Supraclavicular lymph node swelling for 3 weeks. Pain at the site of previous nodal biopsy in 2013, which started draining this morning. HIV and history of MAC infection.  These results will be called to the ordering clinician or representative by the Radiology Department at the imaging location.  EXAM: CT NECK WITH CONTRAST  TECHNIQUE: Multidetector CT imaging of the neck was performed using the standard protocol following the bolus administration of  intravenous contrast.  CONTRAST: 53m ISOVUE-300 IOPAMIDOL (ISOVUE-300) INJECTION 61%  COMPARISON: 07/20/2011  FINDINGS: Pharynx and larynx: Mild symmetric nasopharyngeal thickening. No focal mass lesion or inflammation noted.  Salivary glands: Chronic bilateral parotid calcifications. No acute inflammation or mass. No cysts or nodules are noted.  Thyroid: Negative  Lymph nodes: There is lymphadenopathy at the left thoracic inlet with nodes measuring up to 16 mm in diameter. Adenopathy has improved since prior. Contiguous with the nodes is an area of more low-density abnormality extending superiorly and laterally across the posterior margin of  the sternocleidomastoid, reaching the skin surface and correlating with history of drainage. The subcutaneous 28 mm fluid collection seen on recent sonography appears more decompressed today, correlating with the interval drainage.  Vascular: No acute finding. Aberrant right subclavian artery.  Limited intracranial: Negative  Visualized orbits: Not seen  Mastoids and visualized paranasal sinuses: Presumed mucous retention cysts in the maxillary sinuses.  Skeleton: No acute or aggressive finding. Atlanto occipital non segmentation with hypoplastic posterior arch of C1.  Upper chest: Negative  IMPRESSION: Chronic left supraclavicular adenopathy with history of biopsy and positive mycobacterial culture in 2013. Adenopathy is improved since 2013 but there is a sinus tract from the nodal conglomerate to the left neck skin. Associated subcutaneous fluid collection seen on sonography in 09/22/2016 has partially decompressed.   Electronically Signed By: Monte Fantasia M.D. On: 09/26/2016 17:07    I have independently reviewed the above radiologic studies.  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 With the patient's previous history is most likely the this is a recurrence of the same previous infection . I discussed with the patient and offered her proceeding  with I & D  drainage and biopsy of the area , and leaving a chronic drain in the site until it well-healed . Depending on repeat cultures she will need aggressive treatment or is very unlikely that this chronic draining will stop .    The goals risks and alternatives of the planned surgical procedure Procedure(s): Left Cervical node I/D (incision and drain) and Biopsy (Left)  have been discussed with the patient in detail. The risks of the procedure including death, infection, stroke, myocardial infarction, bleeding, blood transfusion have all been discussed specifically.  I have quoted Florian Buff a 1 % of perioperative mortality and a complication rate as high as 10 %. The patient's questions have been answered.JOLINDA PINKSTAFF is willing  to proceed with the planned procedure.  [name] MD      [address] [place] [address] Office [phone]                Beeper [phone]  10/30/2016 3:38 PM

## 2016-11-14 NOTE — Anesthesia Postprocedure Evaluation (Addendum)
Anesthesia Post Note  Patient: EMIYAH SPRAGGINS  Procedure(s) Performed: Procedure(s) (LRB): Left Cervical node I/D (incision and drain) and Biopsy (Left)     Patient location during evaluation: PACU Anesthesia Type: MAC Level of consciousness: awake and alert, patient cooperative and oriented Pain management: pain level controlled Vital Signs Assessment: post-procedure vital signs reviewed and stable Respiratory status: spontaneous breathing, nonlabored ventilation and respiratory function stable Cardiovascular status: blood pressure returned to baseline and stable Postop Assessment: no signs of nausea or vomiting Anesthetic complications: no    Last Vitals:  Vitals:   11/14/16 1055 11/14/16 1100  BP:  129/72  Pulse: 80 66  Resp: 15 16  Temp:      Last Pain:  Vitals:   11/14/16 0925  TempSrc: Oral                 Sweden Lesure,E. Aolanis Crispen

## 2016-11-14 NOTE — Transfer of Care (Signed)
Immediate Anesthesia Transfer of Care Note  Patient: [name]  Procedure(s) Performed: Procedure(s): Left Cervical node I/D (incision and drain) and Biopsy (Left)  Patient Location: PACU  Anesthesia Type:MAC  Level of Consciousness: awake, alert , oriented and patient cooperative  Airway & Oxygen Therapy: Patient Spontanous Breathing  Post-op Assessment: Report given to RN, Post -op Vital signs reviewed and stable and Patient moving all extremities X 4  Post vital signs: Reviewed and stable  Last Vitals:  Vitals:   11/14/16 0925  BP: 138/87  Pulse: 77  Resp: 18  Temp: 37 C    Last Pain:  Vitals:   11/14/16 0925  TempSrc: Oral         Complications: No apparent anesthesia complications

## 2016-11-14 NOTE — Discharge Instructions (Signed)
Wound Packing Wound packing involves placing a moistened packing material into your wound and then covering it with an outer bandage (dressing). This helps promote proper healing of deep tissue and tissue under the skin. It also helps prevent bleeding, infection, and further injury. Wounds are packed until deep tissue has had time to heal. The time it takes for this to occur is different for everyone. Your health care provider will show you how to pack and dress your wound. Using gloves and a sterile technique is important in order to avoid spreading germs into your wound. What are the risks?  Infection.  Delayed or abnormal healing. How to pack your wound Follow your health care provider's instructions on how often you need to change dressings and pack your wound. You will likely be asked to change dressings 1-2 times a day. Supplies Needed  Gloves.  Wetting solution.  Clean bowl.  Packing material (gauze or gauze sponges).  Clean towels.  Outer dressing.  Tape.  Cotton balls or cotton-tipped swabs.  Small plastic bag. Preparing Your New Packing Material  1. Make sure your work surface or countertop is clean and disinfected. 2. Wash your hands well with soap and water. 3. Put a clean towel on the counter. 4. Put a clean bowl on the towel. Be sure to only touch the outside of the bowl when handling it. 5. Pour wetting solution into the bowl. 6. Cut your packing material (gauze or sponges) to the right size for your wound. Drop it into the bowl. 7. Cut four tape strips that you will use to seal the outer dressing. 8. Put cotton balls or cotton-tipped swabs on the clean towel. Removing the Old Packing and Dressing 1. Gently remove the old dressing and packing material. 2. Put the removed items into the plastic bag to throw away later. 3. Wash your hands well with soap and water again. Applying New Packing Material and Dressing 1. Put on your gloves. 2. Squeeze the packing  material in the bowl to release excess liquid. The packing material should be moist, but not dripping wet. 3. Gently place the packing material into the wound. Use a cotton ball or cotton-tipped swab to guide it into place, filling all of the space. 4. Dry your fingertips on the towel. 5. Open up your outer dressing supplies and put them on a dry part of the towel. Keep them from getting wet. 6. Place the dressing over the packed wound. 7. Tape the four outer edges of the dressing in place. 8. Remove your gloves. 9. Wash your hands again with soap and water. General Tips  Follow your health care provider's instructions on how tightly to pack the wound. At first, the wound will be packed tightly to help stop bleeding. As the wound begins to heal inside, you will use less packing material and pack the wound loosely to allow tissue to heal slowly from the inside out.  Keep the dressing clean and dry.  Follow any other instructions given by your health care provider on how to aid healing. This may include applying warm or cold compresses, elevating the affected area, or wearing a compression dressing.  Ask your health care provider about sun exposure and sunscreen when the dressings are no longer needed.  Keep all follow-up visits with your health care provider. This is important. Contact a health care provider if:  You have drainage, redness, swelling, or pain at your wound site.  You notice a bad smell coming from the   wound site.  Your pain is not controlled with pain medicine.  Tissue inside your wound changes color from pink to white, yellow, or black.  Your wound changes in size or depth.  You have a fever.  You have shaking chills.  You are having trouble packing your wound. This information is not intended to replace advice given to you by your health care provider. Make sure you discuss any questions you have with your health care provider. Document Released: 10/29/2013  Document Revised: 10/22/2015 Document Reviewed: 05/28/2013 Elsevier Interactive Patient Education  2018 [employer]  

## 2016-11-14 NOTE — Anesthesia Preprocedure Evaluation (Signed)
Anesthesia Evaluation  Patient identified by MRN, date of birth, ID band Patient awake    Reviewed: Allergy & Precautions, NPO status , Patient's Chart, lab work & pertinent test results  History of Anesthesia Complications Negative for: history of anesthetic complications  Airway Mallampati: II  TM Distance: >3 FB Neck ROM: Full    Dental  (+) Implants, Dental Advisory Given   Pulmonary neg pulmonary ROS,    breath sounds clear to auscultation       Cardiovascular negative cardio ROS   Rhythm:Regular Rate:Normal     Neuro/Psych negative neurological ROS     GI/Hepatic negative GI ROS, Neg liver ROS,   Endo/Other  Morbid obesity  Renal/GU negative Renal ROS     Musculoskeletal   Abdominal (+) + obese,   Peds  Hematology  (+) HIV (on antivirals),   Anesthesia Other Findings   Reproductive/Obstetrics                             Anesthesia Physical Anesthesia Plan  ASA: III  Anesthesia Plan: MAC   Post-op Pain Management:    Induction:   PONV Risk Score and Plan: 2 and Ondansetron and Dexamethasone  Airway Management Planned: Natural Airway and Simple Face Mask  Additional Equipment:   Intra-op Plan:   Post-operative Plan:   Informed Consent: I have reviewed the patients History and Physical, chart, labs and discussed the procedure including the risks, benefits and alternatives for the proposed anesthesia with the patient or authorized representative who has indicated his/her understanding and acceptance.   Dental advisory given  Plan Discussed with: CRNA and Surgeon  Anesthesia Plan Comments: (Plan routine monitors, MAC with local by surgeons)        Anesthesia Quick Evaluation

## 2016-11-15 ENCOUNTER — Encounter (HOSPITAL_COMMUNITY): Payer: Self-pay | Admitting: Cardiothoracic Surgery

## 2016-11-15 LAB — ACID FAST SMEAR (AFB, MYCOBACTERIA): Acid Fast Smear: NEGATIVE

## 2016-11-15 NOTE — Op Note (Signed)
[name], [place] NO.:  [id number]  MEDICAL RECORD NO.:  [id number]  LOCATION:  [facility]                        FACILITY:  [facility]  PHYSICIAN:  Lanelle Bal, MD    DATE OF BIRTH:  02-08-1976  DATE OF PROCEDURE:  11/14/2016 DATE OF DISCHARGE:                              OPERATIVE REPORT   PREOPERATIVE DIAGNOSIS:  Draining fistula, left lower neck at the site of previous lymph node biopsy 5 years ago.  POSTOPERATIVE DIAGNOSIS:  Draining fistula, left lower neck at the site of previous lymph node biopsy 5 years ago.  SURGICAL PROCEDURE:  Excision and drainage with culture and biopsy of draining fistula, left neck.  SURGEON:  Lanelle Bal, MD  BRIEF HISTORY:  The patient is a 41 year old female who 5 years previously had developed enlarged lymph node in the left neck and underwent left scalene lymph node biopsy by Dr. Arlyce Dice.  No malignancy was identified.  Ultimately, the patient did culture atypical mycobacterium from the lymph node and was treated for a year with p.o. antibiotics.  The site of the biopsy gradually healed; however, over the past several months, the patient has noted that the area of the previous biopsy had slowly opened back up and had been intermittently draining purulent material.  We discussed with the patient repeat biopsy and drainage of the site, likely this was related to persistent mycobacterium infection.  Risks and options were discussed with the patient and she was agreeable with proceeding.  Under MAC anesthesia, the patient was sedated.  The left neck was prepped with Betadine and draped in sterile manner.  The site had previously preoperative been marked.  Appropriate time-out was performed and we proceeded first by infiltrating the area with 1% lidocaine.  The old scar in the area that had been draining was then excised.  There was mucoid material that was removed.  There was no frank pus evident.  The area was debrided  and portions of the site were sent for repeat culture and also for pathology.  With the area clean, we then decided not to primarily close the wound, but packed it with iodoform gauze and have the patient continue to treat the wound with open packing.  Blood loss was minimal. Dry dressing was applied.  The patient tolerated the procedure without obvious complication and was transferred back to the recovery room for observation with the ultimate plan of discharge to home.     Lanelle Bal, MD     EG/MEDQ  D:  11/15/2016  T:  11/15/2016  Job:  [id number]

## 2016-11-17 LAB — ACID FAST SMEAR (AFB, MYCOBACTERIA): Acid Fast Smear: NEGATIVE

## 2016-11-19 LAB — AEROBIC/ANAEROBIC CULTURE W GRAM STAIN (SURGICAL/DEEP WOUND): Culture: NO GROWTH

## 2016-11-20 ENCOUNTER — Ambulatory Visit (INDEPENDENT_AMBULATORY_CARE_PROVIDER_SITE_OTHER): Payer: BLUE CROSS/BLUE SHIELD | Admitting: Surgical

## 2016-11-20 VITALS — BP 146/93 | HR 87 | Resp 18 | Ht 70.0 in

## 2016-11-20 DIAGNOSIS — R590 Localized enlarged lymph nodes: Secondary | ICD-10-CM

## 2016-11-20 DIAGNOSIS — Z09 Encounter for follow-up examination after completed treatment for conditions other than malignant neoplasm: Secondary | ICD-10-CM | POA: Diagnosis not present

## 2016-11-20 NOTE — Progress Notes (Signed)
[address]       [place] [address]             [phone]      [name] [facility] Medical Record #[id number] Date of Birth: March 13, 1976  Referring: Thayer Headings, MD Primary Care: Jonathon Jordan, MD  Chief Complaint:   POST OP FOLLOW UP                             OPERATIVE REPORT   PREOPERATIVE DIAGNOSIS:  Draining fistula, left lower neck at the site of previous lymph node biopsy 5 years ago.  POSTOPERATIVE DIAGNOSIS:  Draining fistula, left lower neck at the site of previous lymph node biopsy 5 years ago.  SURGICAL PROCEDURE:  Excision and drainage with culture and biopsy of draining fistula, left neck.  SURGEON:  Lanelle Bal, MD History of Present Illness:    The patient is a 41 year old female status post the above procedure. She is seen in the office for a wound check. She feels well. She has not had any recent fevers, chills or other significant constitutional symptoms associated with this. The wound is granulating well and has minimal room to pack at this point.  The pathology showed necrotizing granulomatous lymphadenitis and final cultures are pending.    Past Medical History:  Diagnosis Date  . Anxiety   . Candidal esophagitis ([place])   . Complication of anesthesia 07/2011   in PACU took long time for oxygen sts to come up,2008 tonsillectomy went to ICU  . Family history of adverse reaction to anesthesia    mother H/O n/v  . HIV (human immunodeficiency virus infection) ([place])   . MAC (mycobacterium avium-intracellulare complex)    positive culture, treated for 3 months     History  Smoking Status  . Never Smoker  Smokeless Tobacco  . Never Used    History  Alcohol Use  . Yes    Comment: socially     No Known Allergies  Current Outpatient Prescriptions  Medication Sig Dispense Refill  . ALPRAZolam (XANAX) 0.25 MG tablet Take 0.25 mg by mouth 3 (three) times daily as needed. Dr. Stephanie Acre    .  cetirizine (ZYRTEC) 10 MG tablet Take 10 mg by mouth daily as needed for allergies.     Marland Kitchen darunavir (PREZISTA) 800 MG tablet Take 1 tablet (800 mg total) by mouth daily. 30 tablet 5  . elvitegravir-cobicistat-emtricitabine-tenofovir (GENVOYA) 150-150-200-10 MG TABS tablet Take 1 tablet by mouth daily. 30 tablet 5  . HYDROcodone-acetaminophen (NORCO/VICODIN) 5-325 MG tablet Take 1 tablet by mouth every 6 (six) hours as needed for moderate pain. 20 tablet 0  . zolpidem (AMBIEN) 10 MG tablet Take 10 mg by mouth at bedtime as needed for sleep.     No current facility-administered medications for this visit.        Physical Exam: BP (!) 146/93 (BP Location: Right Arm, Patient Position: Sitting, Cuff Size: Large)   Pulse 87   Resp 18   Ht _0  (1.778 m)   LMP 10/23/2016   SpO2 97% Comment: ON RA  Wound: The wound is granulating well without evidence of erythema or current drainage.   Diagnostic Studies & Laboratory data:     Recent Radiology Findings:   No results found.    Recent Lab Findings: Lab Results  Component Value Date   WBC 8.9 11/09/2016   HGB 14.0 11/09/2016  HCT 43.0 11/09/2016   PLT 319 11/09/2016   GLUCOSE 106 (H) 11/09/2016   CHOL 210 (H) 10/03/2016   TRIG 114 10/03/2016   HDL 46 (L) 10/03/2016   LDLCALC 141 (H) 10/03/2016   ALT 16 11/09/2016   AST 19 11/09/2016   NA 136 11/09/2016   K 4.4 11/09/2016   CL 102 11/09/2016   CREATININE 0.82 11/09/2016   BUN 9 11/09/2016   CO2 26 11/09/2016   INR 0.99 11/09/2016      Assessment / Plan:  The wound is healing slowly. He does not have significant depth for packing at this point so she will cover with a dry sterile dressing. We will see again in 4 weeks to reevaluate healing and further management will depend also on final culture reports.          GOLD,WAYNE E, PA-C 11/20/2016 3:08 PM

## 2016-11-20 NOTE — Patient Instructions (Signed)
F/u 4 weeks Dry sterile dressing

## 2016-11-22 LAB — AEROBIC/ANAEROBIC CULTURE W GRAM STAIN (SURGICAL/DEEP WOUND)

## 2016-12-04 ENCOUNTER — Encounter (HOSPITAL_COMMUNITY): Payer: Self-pay

## 2016-12-05 LAB — CULTURE, FUNGUS WITHOUT SMEAR

## 2016-12-06 ENCOUNTER — Telehealth: Payer: Self-pay | Admitting: *Deleted

## 2016-12-06 ENCOUNTER — Other Ambulatory Visit: Payer: Self-pay | Admitting: Internal Medicine

## 2016-12-06 ENCOUNTER — Other Ambulatory Visit: Payer: Self-pay | Admitting: *Deleted

## 2016-12-06 MED ORDER — AZITHROMYCIN 500 MG PO TABS: 500.0000 mg | ORAL_TABLET | Freq: Every day | ORAL | 5 refills | Status: DC

## 2016-12-06 MED ORDER — ETHAMBUTOL HCL 400 MG PO TABS: 1600.0000 mg | ORAL_TABLET | Freq: Every day | ORAL | 5 refills | Status: DC

## 2016-12-06 NOTE — Telephone Encounter (Signed)
Per Dr Linus Salmons called the patient to schedule her to see him sometime next week and had to leave a message for her to call the office as she did not answer.

## 2016-12-06 NOTE — Progress Notes (Signed)
Culture results and PCR/probe from [facility] pathology unrevealing of infection.  Her pathology is granulomatous.  At this time I will empirically start her on MAI therapy based on her history and consider this IRIS.

## 2016-12-06 NOTE — Telephone Encounter (Signed)
error 

## 2016-12-06 NOTE — Telephone Encounter (Signed)
-----   Message from Thayer Headings, MD sent at 12/06/2016  3:45 PM EDT ----- Can you have her follow up with me next week to follow up her recent biopsy.  thanks

## 2016-12-07 LAB — CULTURE, FUNGUS WITHOUT SMEAR

## 2016-12-13 ENCOUNTER — Encounter (HOSPITAL_COMMUNITY): Payer: Self-pay

## 2016-12-19 ENCOUNTER — Encounter (HOSPITAL_COMMUNITY): Payer: Self-pay

## 2016-12-21 ENCOUNTER — Encounter: Payer: BLUE CROSS/BLUE SHIELD | Admitting: Cardiothoracic Surgery

## 2016-12-21 ENCOUNTER — Ambulatory Visit (INDEPENDENT_AMBULATORY_CARE_PROVIDER_SITE_OTHER): Payer: BLUE CROSS/BLUE SHIELD | Admitting: Internal Medicine

## 2016-12-21 VITALS — Ht 70.0 in | Wt 298.0 lb

## 2016-12-21 DIAGNOSIS — R591 Generalized enlarged lymph nodes: Secondary | ICD-10-CM

## 2016-12-21 DIAGNOSIS — R599 Enlarged lymph nodes, unspecified: Secondary | ICD-10-CM | POA: Diagnosis not present

## 2016-12-21 LAB — COMPLETE METABOLIC PANEL WITH GFR
AG Ratio: 1.4 (calc) (ref 1.0–2.5)
ALKALINE PHOSPHATASE (APISO): 85 U/L (ref 33–115)
ALT: 17 U/L (ref 6–29)
AST: 16 U/L (ref 10–30)
Albumin: 4.2 g/dL (ref 3.6–5.1)
BILIRUBIN TOTAL: 0.3 mg/dL (ref 0.2–1.2)
BUN: 12 mg/dL (ref 7–25)
CHLORIDE: 101 mmol/L (ref 98–110)
CO2: 28 mmol/L (ref 20–32)
CREATININE: 0.83 mg/dL (ref 0.50–1.10)
Calcium: 9.5 mg/dL (ref 8.6–10.2)
GFR, Est African American: 102 mL/min/{1.73_m2} (ref >=60)
GFR, Est Non African American: 88 mL/min/{1.73_m2} (ref >=60)
GLUCOSE: 89 mg/dL (ref 65–99)
Globulin: 2.9 g/dL (calc) (ref 1.9–3.7)
Potassium: 4.6 mmol/L (ref 3.5–5.3)
Sodium: 137 mmol/L (ref 135–146)
Total Protein: 7.1 g/dL (ref 6.1–8.1)

## 2016-12-21 NOTE — Progress Notes (Signed)
   Subjective:    Patient ID: [name], female    DOB: 1975/07/20, 41 y.o.   MRN: [id number]  HPI Here for follow up of adenopathy.   Path shows necrotizing granulomatous adenitis.  Cultures have not grown anything though AFB not final yet.  Sample sent to [facility] and all PCR tests negative. Doing well with no fever, no chills no weight loss.  She has a history of MAI in 2013 and I treated her for over 1 year.     I started her on empiric treatment for MAI with ethambutol and azithromycin and she is tolerating it well.  No associated visual complaints.  No abdominal complaints.   Surgical area closed.    Review of Systems  Constitutional: Negative for fatigue.  Gastrointestinal: Negative for diarrhea.  Skin: Negative for rash.       Objective:   Physical Exam  Constitutional: She appears well-developed and well-nourished. No distress.  HENT:  Mouth/Throat: No oropharyngeal exudate.  Eyes: No scleral icterus.  Neck:  Surgical area closed, no tenderness, no erythema  Cardiovascular: Normal rate, regular rhythm and normal heart sounds.   No murmur heard. Skin: No rash noted.   SH: no tobacco       Assessment & Plan:

## 2016-12-21 NOTE — Assessment & Plan Note (Signed)
Path and cultures reviewed with the patient and no definitive diagnosis.  I will have her take the MAI treatment for about 3 months and stop if she is doing well.  I will continue to monitor the cultures.

## 2016-12-21 NOTE — Assessment & Plan Note (Signed)
No new adenopathy noted

## 2016-12-22 DIAGNOSIS — Z01419 Encounter for gynecological examination (general) (routine) without abnormal findings: Secondary | ICD-10-CM | POA: Diagnosis not present

## 2016-12-22 DIAGNOSIS — Z124 Encounter for screening for malignant neoplasm of cervix: Secondary | ICD-10-CM | POA: Diagnosis not present

## 2016-12-22 DIAGNOSIS — Z6841 Body Mass Index (BMI) 40.0 and over, adult: Secondary | ICD-10-CM | POA: Diagnosis not present

## 2016-12-27 ENCOUNTER — Other Ambulatory Visit: Payer: Self-pay | Admitting: Internal Medicine

## 2016-12-27 DIAGNOSIS — B20 Human immunodeficiency virus [HIV] disease: Secondary | ICD-10-CM

## 2016-12-28 LAB — ACID FAST CULTURE WITH REFLEXED SENSITIVITIES (MYCOBACTERIA): Acid Fast Culture: NEGATIVE

## 2017-01-02 ENCOUNTER — Telehealth: Payer: Self-pay | Admitting: *Deleted

## 2017-01-02 LAB — ACID FAST CULTURE WITH REFLEXED SENSITIVITIES (MYCOBACTERIA): Acid Fast Culture: NEGATIVE

## 2017-01-02 NOTE — Telephone Encounter (Signed)
Stomach pain with diarrhea after taking azithromycin. She wants to know if 1) she will have to continue this for a long period of time or 2) is the schedule modifiable so she can better manage side effects. She has been taking it about 1.5 hours after dinner. Will take it tonight with food to see if that helps.  Please advise. Landis Gandy, RN

## 2017-01-02 NOTE — Telephone Encounter (Signed)
Relayed instructions to patient. She verbalized understanding. She will call back if this doesn't help.

## 2017-01-02 NOTE — Telephone Encounter (Signed)
Yes, try with food.  Yes she will need it long-term, maybe 3-6 months.  It can be separate from the other medication.

## 2017-01-25 ENCOUNTER — Ambulatory Visit (INDEPENDENT_AMBULATORY_CARE_PROVIDER_SITE_OTHER): Payer: BLUE CROSS/BLUE SHIELD | Admitting: Cardiothoracic Surgery

## 2017-01-25 ENCOUNTER — Encounter: Payer: Self-pay | Admitting: Cardiothoracic Surgery

## 2017-01-25 ENCOUNTER — Ambulatory Visit (INDEPENDENT_AMBULATORY_CARE_PROVIDER_SITE_OTHER): Payer: BLUE CROSS/BLUE SHIELD

## 2017-01-25 VITALS — BP 138/90 | HR 76 | Ht 70.0 in | Wt 293.0 lb

## 2017-01-25 DIAGNOSIS — Z09 Encounter for follow-up examination after completed treatment for conditions other than malignant neoplasm: Secondary | ICD-10-CM | POA: Diagnosis not present

## 2017-01-25 DIAGNOSIS — Z23 Encounter for immunization: Secondary | ICD-10-CM

## 2017-01-25 DIAGNOSIS — R590 Localized enlarged lymph nodes: Secondary | ICD-10-CM

## 2017-01-25 NOTE — Progress Notes (Signed)
[address]       [place] [address]             [phone]      [name] [facility] Medical Record #[id number] Date of Birth: [birth year]  Referring: Anna Headings, MD Primary Care: Anna Jordan, MD  Chief Complaint:   POST OP FOLLOW UP  OPERATIVE REPORT PREOPERATIVE DIAGNOSIS:  Draining fistula, left lower neck at the site of previous lymph node biopsy 5 years ago.  OSTOPERATIVE DIAGNOSIS:  Draining fistula, left lower neck at the site of previous lymph node biopsy 5 years ago. SURGICAL PROCEDURE:  Excision and drainage with culture and biopsy of draining fistula, left neck.  SURGEON:  Anna Bal, MD  History of Present Illness:   Patient returns to the office for follow-up visit after incision and drainage and rebiopsy of a draining fistula in left lower neck in July of this year.  The pathology showed necrotizing granulomatous lymphadenitis and final cultures were no growth. The patient is closely followed in infectious disease office since been started on antibiotic therapy. She notes that the left neck incision is healed completely, she's had no fever chills night sweats, no further drainage from the left neck incision.    Past Medical History:  Diagnosis Date  . Anxiety   . Candidal esophagitis ([place])   . Complication of anesthesia 07/2011   in PACU took long time for oxygen sts to come up,2008 tonsillectomy went to ICU  . Family history of adverse reaction to anesthesia    mother H/O n/v  . HIV (human immunodeficiency virus infection) ([place])   . MAC (mycobacterium avium-intracellulare complex)    positive culture, treated for 3 months     History  Smoking Status  . Never Smoker  Smokeless Tobacco  . Never Used    History  Alcohol Use  . Yes    Comment: socially     No Known Allergies  Current Outpatient Prescriptions  Medication Sig Dispense Refill  . ALPRAZolam (XANAX) 0.25 MG tablet Take 0.25 mg by mouth 3  (three) times daily as needed. Dr. Stephanie [name]    . azithromycin (ZITHROMAX) 500 MG tablet Take 1 tablet (500 mg total) by mouth daily. 30 tablet 5  . cetirizine (ZYRTEC) 10 MG tablet Take 10 mg by mouth daily as needed for allergies.     Marland Kitchen desonide (DESOWEN) 0.05 % cream desonide 0.05 % topical cream    . ethambutol (MYAMBUTOL) 400 MG tablet Take 4 tablets (1,600 mg total) by mouth daily. 120 tablet 5  . GENVOYA 150-150-200-10 MG TABS tablet TAKE 1 TABLET BY MOUTH DAILY 30 tablet 5  . PREZISTA 800 MG tablet TAKE 1 TABLET(800 MG) BY MOUTH DAILY 30 tablet 5  . zolpidem (AMBIEN) 10 MG tablet Take 10 mg by mouth at bedtime as needed for sleep.     No current facility-administered medications for this visit.        Physical Exam: BP 138/90   Pulse 76   Ht _0  (1.778 m)   Wt 293 lb (132.9 kg)   SpO2 96%   BMI 42.04 kg/m   General appearance: alert and cooperative Neurologic: intact Heart: regular rate and rhythm, S1, S2 normal, no murmur, click, rub or gallop Lungs: clear to auscultation bilaterally Abdomen: soft, non-tender; bowel sounds normal; no masses,  no organomegaly Extremities: extremities normal, atraumatic, no cyanosis or edema Wound: The wound is granulating well without evidence of erythema or  current drainage.   Diagnostic Studies & Laboratory data:     Recent Radiology Findings:   No results found.    Recent Lab Findings: Lab Results  Component Value Date   WBC 8.9 11/09/2016   HGB 14.0 11/09/2016   HCT 43.0 11/09/2016   PLT 319 11/09/2016   GLUCOSE 89 12/21/2016   CHOL 210 (H) 10/03/2016   TRIG 114 10/03/2016   HDL 46 (L) 10/03/2016   LDLCALC 141 (H) 10/03/2016   ALT 17 12/21/2016   AST 16 12/21/2016   NA 137 12/21/2016   K 4.6 12/21/2016   CL 101 12/21/2016   CREATININE 0.83 12/21/2016   BUN 12 12/21/2016   CO2 28 12/21/2016   INR 0.99 11/09/2016      Assessment / Plan:   The left neck incision is now completely healed after the wound was  left open at the time of biopsy and drainage. She continues on antibiotic therapy as directed by Dr. [name]. Plan to see her back as necessary     Anna Isaac, MD 01/25/2017 10:17 AM

## 2017-02-28 DIAGNOSIS — M25512 Pain in left shoulder: Secondary | ICD-10-CM | POA: Diagnosis not present

## 2017-03-05 ENCOUNTER — Ambulatory Visit: Payer: BLUE CROSS/BLUE SHIELD

## 2017-03-12 ENCOUNTER — Ambulatory Visit: Payer: BLUE CROSS/BLUE SHIELD

## 2017-03-13 DIAGNOSIS — G8918 Other acute postprocedural pain: Secondary | ICD-10-CM | POA: Diagnosis not present

## 2017-03-13 DIAGNOSIS — M7552 Bursitis of left shoulder: Secondary | ICD-10-CM | POA: Diagnosis not present

## 2017-03-13 DIAGNOSIS — M24112 Other articular cartilage disorders, left shoulder: Secondary | ICD-10-CM | POA: Diagnosis not present

## 2017-03-13 DIAGNOSIS — M19012 Primary osteoarthritis, left shoulder: Secondary | ICD-10-CM | POA: Diagnosis not present

## 2017-03-13 DIAGNOSIS — M75112 Incomplete rotator cuff tear or rupture of left shoulder, not specified as traumatic: Secondary | ICD-10-CM | POA: Diagnosis not present

## 2017-03-13 DIAGNOSIS — M7542 Impingement syndrome of left shoulder: Secondary | ICD-10-CM | POA: Diagnosis not present

## 2017-03-23 DIAGNOSIS — M19012 Primary osteoarthritis, left shoulder: Secondary | ICD-10-CM | POA: Diagnosis not present

## 2017-04-03 ENCOUNTER — Other Ambulatory Visit: Payer: BLUE CROSS/BLUE SHIELD

## 2017-04-03 DIAGNOSIS — B20 Human immunodeficiency virus [HIV] disease: Secondary | ICD-10-CM

## 2017-04-04 ENCOUNTER — Other Ambulatory Visit: Payer: BLUE CROSS/BLUE SHIELD

## 2017-04-04 DIAGNOSIS — Z9889 Other specified postprocedural states: Secondary | ICD-10-CM | POA: Diagnosis not present

## 2017-04-05 LAB — HIV-1 RNA QUANT-NO REFLEX-BLD
HIV 1 RNA QUANT: 98 {copies}/mL — AB
HIV-1 RNA QUANT, LOG: 1.99 {Log_copies}/mL — AB

## 2017-04-05 LAB — T-HELPER CELL (CD4) - (RCID CLINIC ONLY)
CD4 % Helper T Cell: 25 % — ABNORMAL LOW (ref 33–55)
CD4 T Cell Abs: 690 /uL (ref 400–2700)

## 2017-04-15 DIAGNOSIS — R300 Dysuria: Secondary | ICD-10-CM | POA: Diagnosis not present

## 2017-04-15 DIAGNOSIS — R350 Frequency of micturition: Secondary | ICD-10-CM | POA: Diagnosis not present

## 2017-04-18 ENCOUNTER — Ambulatory Visit: Payer: BLUE CROSS/BLUE SHIELD | Admitting: Internal Medicine

## 2017-05-01 ENCOUNTER — Ambulatory Visit: Payer: BLUE CROSS/BLUE SHIELD | Admitting: Internal Medicine

## 2017-05-01 ENCOUNTER — Ambulatory Visit: Payer: BLUE CROSS/BLUE SHIELD

## 2017-06-24 ENCOUNTER — Other Ambulatory Visit: Payer: Self-pay | Admitting: Internal Medicine

## 2017-06-24 DIAGNOSIS — R509 Fever, unspecified: Secondary | ICD-10-CM | POA: Diagnosis not present

## 2017-06-24 DIAGNOSIS — B20 Human immunodeficiency virus [HIV] disease: Secondary | ICD-10-CM

## 2017-06-24 DIAGNOSIS — J101 Influenza due to other identified influenza virus with other respiratory manifestations: Secondary | ICD-10-CM | POA: Diagnosis not present

## 2017-10-02 DIAGNOSIS — R399 Unspecified symptoms and signs involving the genitourinary system: Secondary | ICD-10-CM | POA: Diagnosis not present

## 2017-10-12 ENCOUNTER — Other Ambulatory Visit: Payer: BLUE CROSS/BLUE SHIELD

## 2017-10-12 ENCOUNTER — Other Ambulatory Visit: Payer: Self-pay | Admitting: *Deleted

## 2017-10-12 DIAGNOSIS — B20 Human immunodeficiency virus [HIV] disease: Secondary | ICD-10-CM

## 2017-10-12 DIAGNOSIS — Z113 Encounter for screening for infections with a predominantly sexual mode of transmission: Secondary | ICD-10-CM

## 2017-10-17 ENCOUNTER — Other Ambulatory Visit: Payer: BLUE CROSS/BLUE SHIELD

## 2017-10-17 DIAGNOSIS — Z113 Encounter for screening for infections with a predominantly sexual mode of transmission: Secondary | ICD-10-CM | POA: Diagnosis not present

## 2017-10-17 DIAGNOSIS — B20 Human immunodeficiency virus [HIV] disease: Secondary | ICD-10-CM

## 2017-10-19 LAB — CBC WITH DIFFERENTIAL/PLATELET
BASOS PCT: 0.9 %
Basophils Absolute: 60 cells/uL (ref 0–200)
EOS PCT: 6.6 %
Eosinophils Absolute: 442 cells/uL (ref 15–500)
HCT: 39.5 % (ref 35.0–45.0)
HEMOGLOBIN: 13.1 g/dL (ref 11.7–15.5)
Lymphs Abs: 1648 cells/uL (ref 850–3900)
MCH: 29.5 pg (ref 27.0–33.0)
MCHC: 33.2 g/dL (ref 32.0–36.0)
MCV: 89 fL (ref 80.0–100.0)
MPV: 9.8 fL (ref 7.5–12.5)
Monocytes Relative: 5.4 %
NEUTROS ABS: 4188 {cells}/uL (ref 1500–7800)
Neutrophils Relative %: 62.5 %
Platelets: 273 10*3/uL (ref 140–400)
RBC: 4.44 10*6/uL (ref 3.80–5.10)
RDW: 13 % (ref 11.0–15.0)
Total Lymphocyte: 24.6 %
WBC mixed population: 362 cells/uL (ref 200–950)
WBC: 6.7 10*3/uL (ref 3.8–10.8)

## 2017-10-19 LAB — COMPLETE METABOLIC PANEL WITH GFR
AG Ratio: 1.3 (calc) (ref 1.0–2.5)
ALT: 11 U/L (ref 6–29)
AST: 15 U/L (ref 10–30)
Albumin: 3.9 g/dL (ref 3.6–5.1)
Alkaline phosphatase (APISO): 71 U/L (ref 33–115)
BUN: 12 mg/dL (ref 7–25)
CALCIUM: 9.2 mg/dL (ref 8.6–10.2)
CHLORIDE: 104 mmol/L (ref 98–110)
CO2: 27 mmol/L (ref 20–32)
Creat: 0.97 mg/dL (ref 0.50–1.10)
GFR, EST NON AFRICAN AMERICAN: 73 mL/min/{1.73_m2} (ref >=60)
GFR, Est African American: 84 mL/min/{1.73_m2} (ref >=60)
GLOBULIN: 3 g/dL (ref 1.9–3.7)
Glucose, Bld: 125 mg/dL — ABNORMAL HIGH (ref 65–99)
POTASSIUM: 4.4 mmol/L (ref 3.5–5.3)
Sodium: 139 mmol/L (ref 135–146)
Total Bilirubin: 0.5 mg/dL (ref 0.2–1.2)
Total Protein: 6.9 g/dL (ref 6.1–8.1)

## 2017-10-19 LAB — T-HELPER CELL (CD4) - (RCID CLINIC ONLY)
CD4 T CELL HELPER: 25 % — AB (ref 33–55)
CD4 T Cell Abs: 430 /uL (ref 400–2700)

## 2017-10-19 LAB — RPR: RPR Ser Ql: NONREACTIVE

## 2017-10-22 LAB — HIV-1 RNA QUANT-NO REFLEX-BLD
HIV 1 RNA QUANT: DETECTED {copies}/mL — AB
HIV-1 RNA QUANT, LOG: DETECTED {Log_copies}/mL — AB

## 2017-10-23 ENCOUNTER — Encounter: Payer: BLUE CROSS/BLUE SHIELD | Admitting: Internal Medicine

## 2017-10-24 ENCOUNTER — Encounter (HOSPITAL_BASED_OUTPATIENT_CLINIC_OR_DEPARTMENT_OTHER): Payer: Self-pay

## 2017-10-24 ENCOUNTER — Emergency Department (HOSPITAL_BASED_OUTPATIENT_CLINIC_OR_DEPARTMENT_OTHER): Payer: No Typology Code available for payment source

## 2017-10-24 ENCOUNTER — Emergency Department (HOSPITAL_BASED_OUTPATIENT_CLINIC_OR_DEPARTMENT_OTHER)
Admission: EM | Admit: 2017-10-24 | Discharge: 2017-10-24 | Disposition: A | Discharge status: Home / Self Care | Payer: No Typology Code available for payment source | Attending: Emergency Medicine | Admitting: Emergency Medicine

## 2017-10-24 ENCOUNTER — Other Ambulatory Visit: Payer: Self-pay

## 2017-10-24 DIAGNOSIS — S8001XA Contusion of right knee, initial encounter: Secondary | ICD-10-CM | POA: Insufficient documentation

## 2017-10-24 DIAGNOSIS — Y9301 Activity, walking, marching and hiking: Secondary | ICD-10-CM | POA: Insufficient documentation

## 2017-10-24 DIAGNOSIS — W108XXA Fall (on) (from) other stairs and steps, initial encounter: Secondary | ICD-10-CM | POA: Diagnosis not present

## 2017-10-24 DIAGNOSIS — Z79899 Other long term (current) drug therapy: Secondary | ICD-10-CM | POA: Insufficient documentation

## 2017-10-24 DIAGNOSIS — Y929 Unspecified place or not applicable: Secondary | ICD-10-CM | POA: Diagnosis not present

## 2017-10-24 DIAGNOSIS — S8991XA Unspecified injury of right lower leg, initial encounter: Secondary | ICD-10-CM | POA: Diagnosis present

## 2017-10-24 DIAGNOSIS — W19XXXA Unspecified fall, initial encounter: Secondary | ICD-10-CM

## 2017-10-24 DIAGNOSIS — Y990 Civilian activity done for income or pay: Secondary | ICD-10-CM | POA: Insufficient documentation

## 2017-10-24 DIAGNOSIS — S82891A Other fracture of right lower leg, initial encounter for closed fracture: Secondary | ICD-10-CM | POA: Insufficient documentation

## 2017-10-24 DIAGNOSIS — B20 Human immunodeficiency virus [HIV] disease: Secondary | ICD-10-CM | POA: Insufficient documentation

## 2017-10-24 DIAGNOSIS — S82831A Other fracture of upper and lower end of right fibula, initial encounter for closed fracture: Secondary | ICD-10-CM | POA: Diagnosis not present

## 2017-10-24 MED ORDER — HYDROCODONE-ACETAMINOPHEN 5-325 MG PO TABS: 1.0000 | ORAL_TABLET | Freq: Four times a day (QID) | ORAL | 0 refills | Status: DC | PRN

## 2017-10-24 MED FILL — HYDROCODON-APAP 5-325: 5-325 | 3 days supply | Qty: 10 | Fill #0

## 2017-10-24 NOTE — ED Triage Notes (Signed)
Pt states she missed a step and fell at work approx 845am-pain to right ankle and right knee-NAD-ambulating with crutches

## 2017-10-24 NOTE — ED Notes (Signed)
Patient transported to X-ray 

## 2017-10-24 NOTE — Discharge Instructions (Signed)
Crutches at all times put no weight on the right ankle.  Do not get the splint wet.  Call [facility] for follow-up.  Elevate the right leg is much as possible.  Take the hydrocodone as needed for pain.

## 2017-10-24 NOTE — ED Provider Notes (Signed)
[facility] EMERGENCY DEPARTMENT Provider Note   CSN: [id number] Arrival date & time: 10/24/17  1238     History   Chief Complaint Chief Complaint  Patient presents with  . Fall    HPI [name] is a 42 y.o. female.  Patient with fall while at work this morning at approximately 845.  She missed a step and fell injuring her right knee and right ankle.  Patient using crutches that she borrowed.  Patient has been followed by [facility] orthopedics in the past.  Patient did not hit her head there was no loss of consciousness.  Patient denies any other injuries.     Past Medical History:  Diagnosis Date  . Anxiety   . Candidal esophagitis ([place])   . Complication of anesthesia 07/2011   in PACU took long time for oxygen sts to come up,2008 tonsillectomy went to ICU  . Family history of adverse reaction to anesthesia    mother H/O n/v  . HIV (human immunodeficiency virus infection) ([place])   . MAC (mycobacterium avium-intracellulare complex)    positive culture, treated for 3 months    Patient Active Problem List   Diagnosis Date Noted  . Granulomatous adenopathy 12/21/2016  . Adenopathy 10/03/2016  . Screening examination for venereal disease 08/06/2013  . Encounter for long-term (current) use of high-risk medication 08/06/2013  . HIV disease ([place]) 12/29/2010  . Anxiety 12/29/2010    Past Surgical History:  Procedure Laterality Date  . ANTERIOR CRUCIATE LIGAMENT REPAIR     Right and left repair  . LYMPH NODE BIOPSY Left 11/14/2016   Procedure: Left Cervical node I/D (incision and drain) and Biopsy;  Surgeon: [name], MD;  Location: [facility];  Service: Thoracic;  Laterality: Left;  . NASAL RECONSTRUCTION  1982  . [place] NODE BIOPSY  07/28/2011   Procedure: SUPRACLAVICAL NODE BIOPSY;  Surgeon: Nicanor Alcon, MD;  Location: [facility];  Service: Thoracic;  Laterality: Left;  . TONSILLECTOMY       OB History   None      Home Medications     Prior to Admission medications   Medication Sig Start Date End Date Taking? Authorizing Provider  ALPRAZolam (XANAX) 0.25 MG tablet Take 0.25 mg by mouth 3 (three) times daily as needed. Dr. Stephanie Acre    [provider]  azithromycin (ZITHROMAX) 500 MG tablet Take 1 tablet (500 mg total) by mouth daily. 12/06/16   Thayer Headings, MD  cetirizine (ZYRTEC) 10 MG tablet Take 10 mg by mouth daily as needed for allergies.     [provider]  desonide (DESOWEN) 0.05 % cream desonide 0.05 % topical cream    [provider]  ethambutol (MYAMBUTOL) 400 MG tablet Take 4 tablets (1,600 mg total) by mouth daily. 12/06/16   Thayer Headings, MD  GENVOYA 150-150-200-10 MG TABS tablet TAKE 1 TABLET BY MOUTH DAILY 06/25/17   Comer, Okey Regal, MD  HYDROcodone-acetaminophen (NORCO/VICODIN) 5-325 MG tablet Take 1 tablet by mouth every 6 (six) hours as needed for moderate pain. 10/24/17   Fredia Sorrow, MD  PREZISTA 800 MG tablet TAKE 1 TABLET(800 MG) BY MOUTH DAILY 06/25/17   Comer, Okey Regal, MD  zolpidem (AMBIEN) 10 MG tablet Take 10 mg by mouth at bedtime as needed for sleep.    [provider]    Family History Family History  Problem Relation Age of Onset  . Anesthesia problems Neg Hx   . Hypotension Neg Hx   .  Malignant hyperthermia Neg Hx   . Pseudochol deficiency Neg Hx     Social History Social History   Tobacco Use  . Smoking status: Never Smoker  . Smokeless tobacco: Never Used  Substance Use Topics  . Alcohol use: Yes    Comment: socially  . Drug use: No     Allergies   Patient has no known allergies.   Review of Systems Review of Systems  Constitutional: Negative for fever.  HENT: Negative for congestion.   Respiratory: Negative for shortness of breath.   Cardiovascular: Negative for chest pain.  Gastrointestinal: Negative for abdominal pain.  Genitourinary: Negative for dysuria.  Musculoskeletal: Positive for joint swelling. Negative for  back pain and neck pain.  Skin: Negative for rash.  Neurological: Negative for headaches.  Hematological: Does not bruise/bleed easily.  Psychiatric/Behavioral: Negative for confusion.     Physical Exam Updated Vital Signs BP (!) 155/91 (BP Location: Left Arm)   Pulse 92   Temp 98.5 F (36.9 C) (Oral)   Resp 18   Ht 1.778 m (_0 )   Wt (!) 137.9 kg (304 lb)   LMP 10/17/2017   SpO2 100%   BMI 43.62 kg/m   Physical Exam  Constitutional: She is oriented to person, place, and time. She appears well-developed and well-nourished. No distress.  HENT:  Head: Normocephalic and atraumatic.  Mouth/Throat: Oropharynx is clear and moist.  Eyes: Pupils are equal, round, and reactive to light. Conjunctivae and EOM are normal.  Neck: Normal range of motion. Neck supple.  Cardiovascular: Normal rate, regular rhythm and normal heart sounds.  Pulmonary/Chest: Effort normal and breath sounds normal.  Abdominal: Soft. Bowel sounds are normal. There is no tenderness.  Musculoskeletal: Normal range of motion. She exhibits edema and tenderness.  Swelling to the lateral aspect of the right ankle with tenderness.  No medial swelling.  Dorsalis pedis pulse 2+.  Good cap refill sensation intact.  No significant swelling to the right knee.  Does have a small contusion.  No obvious deformity.  Neurological: She is alert and oriented to person, place, and time. No cranial nerve deficit or sensory deficit. She exhibits normal muscle tone. Coordination normal.  Skin: Skin is warm.  Nursing note and vitals reviewed.    ED Treatments / Results  Labs (all labs ordered are listed, but only abnormal results are displayed) Labs Reviewed - No data to display  EKG None  Radiology Dg Ankle Complete Right  Result Date: 10/24/2017 CLINICAL DATA:  Misstepped going down stairs today and fell rolling the right ankle and twisting the right knee. Persistent lateral ankle pain. EXAM: RIGHT ANKLE - COMPLETE 3+  VIEW COMPARISON:  None in PACs FINDINGS: The patient has sustained a minimally displaced fracture of the distal fibular metaphysis. The ankle joint mortise is preserved. The talar dome is intact. The medial and posterior malleoli are intact. The calcaneus exhibits no acute abnormality. There is an unfused apophysis of the base of the fifth meta tarsal. There is soft tissue swelling anteriorly and laterally. IMPRESSION: There is an acute mildly displaced fracture of the distal right fibular metaphysis. No acute fracture is observed elsewhere in the ankle. Electronically Signed   By: David  Martinique M.D.   On: 10/24/2017 13:21   Dg Knee Complete 4 Views Right  Result Date: 10/24/2017 CLINICAL DATA:  Missed step today and fell injuring the right ankle and twisting the right knee. EXAM: RIGHT KNEE - COMPLETE 4+ VIEW COMPARISON:  None in PACs FINDINGS: The  bones are subjectively adequately mineralized. There is mild loss of the lateral joint space. There is mild beaking of the medial tibial spine. Spurs arise from the lateral articular margin of the lateral femoral condyle and lateral tibial plateau. There is no tibial plateau fracture. Spurs arise from the articular margins of the patella. There is a small suprapatellar effusion. There is no acute fracture or dislocation. IMPRESSION: Moderate degenerative change of the lateral joint compartment. Milder degenerative changes of the patellofemoral compartment. No acute bony abnormality. Electronically Signed   By: David  Martinique M.D.   On: 10/24/2017 13:22    Procedures Procedures (including critical care time)  Medications Ordered in ED Medications - No data to display   Initial Impression / Assessment and Plan / ED Course  I have reviewed the triage vital signs and the nursing notes.  Pertinent labs & imaging results that were available during my care of the patient were reviewed by me and considered in my medical decision making (see chart for  details).     Patient with fall at work.  Injuring her right ankle and landing on her right knee.  X-rays of the right knee without any bony injury.  Right ankle shows a distal fibula fracture.  Closed.  Swelling to the lateral aspect of the right ankle good cap refill dorsalis pedis pulse 2+.  No swelling medially.  Patient treated with stirrup splint fitted for crutches given hydrocodone for pain.  Patient followed by [facility] in the past patient will call for follow-up.  Patient will elevate the right leg is much as possible.    Final Clinical Impressions(s) / ED Diagnoses   Final diagnoses:  Fall, initial encounter  Contusion of right knee, initial encounter  Closed fracture of right ankle, initial encounter    ED Discharge Orders        Ordered    HYDROcodone-acetaminophen (NORCO/VICODIN) 5-325 MG tablet  Every 6 hours PRN     10/24/17 1502       Fredia Sorrow, MD 10/24/17 [phone]

## 2017-10-24 NOTE — ED Notes (Signed)
ED Provider at bedside. 

## 2017-10-26 ENCOUNTER — Encounter (HOSPITAL_BASED_OUTPATIENT_CLINIC_OR_DEPARTMENT_OTHER): Payer: Self-pay | Admitting: *Deleted

## 2017-10-26 ENCOUNTER — Other Ambulatory Visit: Payer: Self-pay | Admitting: Orthopedic Surgery

## 2017-10-26 ENCOUNTER — Other Ambulatory Visit: Payer: Self-pay

## 2017-10-29 ENCOUNTER — Telehealth: Payer: Self-pay

## 2017-10-29 NOTE — Telephone Encounter (Signed)
Patient called back to check her med refill request and per Comer she does not need to continue to take those medications.

## 2017-10-29 NOTE — Telephone Encounter (Signed)
She doesn't need to continue.  thanks

## 2017-10-29 NOTE — Telephone Encounter (Signed)
Patient left message on voice mail requesting refill of azithromycin and ethambutol. Last office was in September 2019 . Note states continue 6 months. A refill at this time would be longer than 6 months.  She has appointment scheduled for 12-10-17.     Please advise.

## 2017-10-31 HISTORY — PX: ANKLE ARTHROSCOPY WITH OPEN REDUCTION INTERNAL FIXATION (ORIF): SHX5582

## 2017-11-01 ENCOUNTER — Ambulatory Visit (HOSPITAL_BASED_OUTPATIENT_CLINIC_OR_DEPARTMENT_OTHER): Payer: No Typology Code available for payment source | Admitting: Anesthesiology

## 2017-11-01 ENCOUNTER — Encounter (HOSPITAL_BASED_OUTPATIENT_CLINIC_OR_DEPARTMENT_OTHER)
Admission: RE | Disposition: A | Discharge status: Home / Self Care | Payer: Self-pay | Source: Ambulatory Visit | Attending: Orthopedic Surgery

## 2017-11-01 ENCOUNTER — Ambulatory Visit (HOSPITAL_BASED_OUTPATIENT_CLINIC_OR_DEPARTMENT_OTHER)
Admission: RE | Admit: 2017-11-01 | Discharge: 2017-11-01 | Disposition: A | Discharge status: Home / Self Care | Payer: No Typology Code available for payment source | Source: Ambulatory Visit | Attending: Orthopedic Surgery | Admitting: Orthopedic Surgery

## 2017-11-01 ENCOUNTER — Other Ambulatory Visit: Payer: Self-pay

## 2017-11-01 ENCOUNTER — Encounter (HOSPITAL_BASED_OUTPATIENT_CLINIC_OR_DEPARTMENT_OTHER): Payer: Self-pay | Admitting: Emergency Medicine

## 2017-11-01 DIAGNOSIS — Z79899 Other long term (current) drug therapy: Secondary | ICD-10-CM | POA: Insufficient documentation

## 2017-11-01 DIAGNOSIS — F419 Anxiety disorder, unspecified: Secondary | ICD-10-CM | POA: Insufficient documentation

## 2017-11-01 DIAGNOSIS — Z6841 Body Mass Index (BMI) 40.0 and over, adult: Secondary | ICD-10-CM | POA: Insufficient documentation

## 2017-11-01 DIAGNOSIS — Y9289 Other specified places as the place of occurrence of the external cause: Secondary | ICD-10-CM | POA: Diagnosis not present

## 2017-11-01 DIAGNOSIS — W108XXA Fall (on) (from) other stairs and steps, initial encounter: Secondary | ICD-10-CM | POA: Diagnosis not present

## 2017-11-01 DIAGNOSIS — Y990 Civilian activity done for income or pay: Secondary | ICD-10-CM | POA: Diagnosis not present

## 2017-11-01 DIAGNOSIS — S8261XA Displaced fracture of lateral malleolus of right fibula, initial encounter for closed fracture: Secondary | ICD-10-CM | POA: Diagnosis not present

## 2017-11-01 DIAGNOSIS — Z21 Asymptomatic human immunodeficiency virus [HIV] infection status: Secondary | ICD-10-CM | POA: Insufficient documentation

## 2017-11-01 HISTORY — PX: ORIF ANKLE FRACTURE: SHX5408

## 2017-11-01 HISTORY — DX: Displaced fracture of lateral malleolus of right fibula, initial encounter for closed fracture: S82.61XA

## 2017-11-01 SURGERY — OPEN REDUCTION INTERNAL FIXATION (ORIF) ANKLE FRACTURE
Anesthesia: Regional | Site: Ankle | Laterality: Right

## 2017-11-01 MED ORDER — MIDAZOLAM HCL 2 MG/2ML IJ SOLN
INTRAMUSCULAR | Status: AC
  Filled 2017-11-01: qty 2

## 2017-11-01 MED ORDER — SODIUM CHLORIDE 0.9 % IV SOLN: INTRAVENOUS | Status: DC

## 2017-11-01 MED ORDER — DEXAMETHASONE SODIUM PHOSPHATE 10 MG/ML IJ SOLN
INTRAMUSCULAR | Status: AC
  Filled 2017-11-01: qty 1

## 2017-11-01 MED ORDER — OXYCODONE HCL 5 MG/5ML PO SOLN: 5.0000 mg | Freq: Once | ORAL | Status: DC | PRN

## 2017-11-01 MED ORDER — LIDOCAINE 2% (20 MG/ML) 5 ML SYRINGE
INTRAMUSCULAR | Status: DC | PRN
  Administered 2017-11-01: 50 mg via INTRAVENOUS

## 2017-11-01 MED ORDER — LIDOCAINE HCL (CARDIAC) PF 100 MG/5ML IV SOSY
PREFILLED_SYRINGE | INTRAVENOUS | Status: AC
  Filled 2017-11-01: qty 5

## 2017-11-01 MED ORDER — MIDAZOLAM HCL 2 MG/2ML IJ SOLN
1.0000 mg | INTRAMUSCULAR | Status: DC | PRN
  Administered 2017-11-01: 2 mg via INTRAVENOUS

## 2017-11-01 MED ORDER — CEFAZOLIN SODIUM-DEXTROSE 2-4 GM/100ML-% IV SOLN
2.0000 g | INTRAVENOUS | Status: AC
  Administered 2017-11-01: 3 g via INTRAVENOUS

## 2017-11-01 MED ORDER — PROMETHAZINE HCL 25 MG/ML IJ SOLN: 6.2500 mg | INTRAMUSCULAR | Status: DC | PRN

## 2017-11-01 MED ORDER — LACTATED RINGERS IV SOLN
INTRAVENOUS | Status: DC
  Administered 2017-11-01: 12:00:00 via INTRAVENOUS

## 2017-11-01 MED ORDER — HYDROMORPHONE HCL 1 MG/ML IJ SOLN: 0.2500 mg | INTRAMUSCULAR | Status: DC | PRN

## 2017-11-01 MED ORDER — PROPOFOL 10 MG/ML IV BOLUS
INTRAVENOUS | Status: DC | PRN
  Administered 2017-11-01: 200 mg via INTRAVENOUS

## 2017-11-01 MED ORDER — SCOPOLAMINE 1 MG/3DAYS TD PT72: 1.0000 | MEDICATED_PATCH | Freq: Once | TRANSDERMAL | Status: DC

## 2017-11-01 MED ORDER — LACTATED RINGERS IV SOLN: INTRAVENOUS | Status: DC

## 2017-11-01 MED ORDER — MEPERIDINE HCL 25 MG/ML IJ SOLN: 6.2500 mg | INTRAMUSCULAR | Status: DC | PRN

## 2017-11-01 MED ORDER — FENTANYL CITRATE (PF) 100 MCG/2ML IJ SOLN
INTRAMUSCULAR | Status: AC
Start: 2017-11-01 — End: ?
  Filled 2017-11-01: qty 2

## 2017-11-01 MED ORDER — FENTANYL CITRATE (PF) 100 MCG/2ML IJ SOLN
INTRAMUSCULAR | Status: AC
  Filled 2017-11-01: qty 2

## 2017-11-01 MED ORDER — OXYCODONE HCL 5 MG PO TABS: 5.0000 mg | ORAL_TABLET | Freq: Four times a day (QID) | ORAL | 0 refills | Status: DC | PRN

## 2017-11-01 MED ORDER — CHLORHEXIDINE GLUCONATE 4 % EX LIQD: 60.0000 mL | Freq: Once | CUTANEOUS | Status: DC

## 2017-11-01 MED ORDER — CEFAZOLIN SODIUM-DEXTROSE 2-4 GM/100ML-% IV SOLN
INTRAVENOUS | Status: AC
  Filled 2017-11-01: qty 100

## 2017-11-01 MED ORDER — FENTANYL CITRATE (PF) 100 MCG/2ML IJ SOLN
50.0000 ug | INTRAMUSCULAR | Status: DC | PRN
  Administered 2017-11-01 (×2): 50 ug via INTRAVENOUS

## 2017-11-01 MED ORDER — ONDANSETRON HCL 4 MG/2ML IJ SOLN
INTRAMUSCULAR | Status: AC
  Filled 2017-11-01: qty 2

## 2017-11-01 MED ORDER — OXYCODONE HCL 5 MG PO TABS: 5.0000 mg | ORAL_TABLET | Freq: Once | ORAL | Status: DC | PRN

## 2017-11-01 MED ORDER — DEXAMETHASONE SODIUM PHOSPHATE 4 MG/ML IJ SOLN
INTRAMUSCULAR | Status: DC | PRN
  Administered 2017-11-01: 10 mg via INTRAVENOUS

## 2017-11-01 MED ORDER — ONDANSETRON HCL 4 MG/2ML IJ SOLN
INTRAMUSCULAR | Status: DC | PRN
  Administered 2017-11-01: 4 mg via INTRAVENOUS

## 2017-11-01 MED ORDER — ROPIVACAINE HCL 5 MG/ML IJ SOLN
INTRAMUSCULAR | Status: DC | PRN
  Administered 2017-11-01: 30 mL via PERINEURAL

## 2017-11-01 MED ORDER — CEFAZOLIN SODIUM-DEXTROSE 1-4 GM/50ML-% IV SOLN
INTRAVENOUS | Status: AC
  Filled 2017-11-01: qty 50

## 2017-11-01 SURGICAL SUPPLY — 71 items
BANDAGE ESMARK 6X9 LF (GAUZE/BANDAGES/DRESSINGS) ×1 IMPLANT
BIT DRILL 2.5X2.75 QC CALB (BIT) ×1 IMPLANT
BIT DRILL 3.5X5.5 QC CALB (BIT) ×1 IMPLANT
BLADE SURG 15 STRL LF DISP TIS (BLADE) ×2 IMPLANT
BLADE SURG 15 STRL SS (BLADE) ×4
BNDG CMPR 9X4 STRL LF SNTH (GAUZE/BANDAGES/DRESSINGS)
BNDG CMPR 9X6 STRL LF SNTH (GAUZE/BANDAGES/DRESSINGS) ×1
BNDG COHESIVE 4X5 TAN STRL (GAUZE/BANDAGES/DRESSINGS) ×2 IMPLANT
BNDG COHESIVE 6X5 TAN STRL LF (GAUZE/BANDAGES/DRESSINGS) ×2 IMPLANT
BNDG ESMARK 4X9 LF (GAUZE/BANDAGES/DRESSINGS) IMPLANT
BNDG ESMARK 6X9 LF (GAUZE/BANDAGES/DRESSINGS) ×2
CANISTER SUCT 1200ML W/VALVE (MISCELLANEOUS) ×2 IMPLANT
CHLORAPREP W/TINT 26ML (MISCELLANEOUS) ×2 IMPLANT
COVER BACK TABLE 60X90IN (DRAPES) ×2 IMPLANT
CUFF TOURNIQUET SINGLE 34IN LL (TOURNIQUET CUFF) IMPLANT
CUFF TOURNIQUET SINGLE 44IN (TOURNIQUET CUFF) ×2 IMPLANT
DECANTER SPIKE VIAL GLASS SM (MISCELLANEOUS) IMPLANT
DRAPE EXTREMITY T 121X128X90 (DRAPE) ×2 IMPLANT
DRAPE OEC MINIVIEW 54X84 (DRAPES) ×2 IMPLANT
DRAPE U-SHAPE 47X51 STRL (DRAPES) ×2 IMPLANT
DRSG MEPITEL 4X7.2 (GAUZE/BANDAGES/DRESSINGS) ×2 IMPLANT
DRSG PAD ABDOMINAL 8X10 ST (GAUZE/BANDAGES/DRESSINGS) ×4 IMPLANT
ELECT REM PT RETURN 9FT ADLT (ELECTROSURGICAL) ×2
ELECTRODE REM PT RTRN 9FT ADLT (ELECTROSURGICAL) ×1 IMPLANT
GAUZE SPONGE 4X4 12PLY STRL (GAUZE/BANDAGES/DRESSINGS) ×2 IMPLANT
GLOVE BIO SURGEON STRL SZ8 (GLOVE) ×1 IMPLANT
GLOVE BIOGEL PI IND STRL 8 (GLOVE) ×2 IMPLANT
GLOVE BIOGEL PI INDICATOR 8 (GLOVE) ×1
GLOVE ECLIPSE 8.0 STRL XLNG CF (GLOVE) ×2 IMPLANT
GOWN STRL REUS W/ TWL LRG LVL3 (GOWN DISPOSABLE) ×1 IMPLANT
GOWN STRL REUS W/ TWL XL LVL3 (GOWN DISPOSABLE) ×2 IMPLANT
GOWN STRL REUS W/TWL LRG LVL3 (GOWN DISPOSABLE) ×2
GOWN STRL REUS W/TWL XL LVL3 (GOWN DISPOSABLE) ×2
NEEDLE HYPO 22GX1.5 SAFETY (NEEDLE) IMPLANT
NS IRRIG 1000ML POUR BTL (IV SOLUTION) ×2 IMPLANT
PACK BASIN DAY SURGERY FS (CUSTOM PROCEDURE TRAY) ×2 IMPLANT
PAD CAST 4YDX4 CTTN HI CHSV (CAST SUPPLIES) ×1 IMPLANT
PADDING CAST ABS 4INX4YD NS (CAST SUPPLIES)
PADDING CAST ABS COTTON 4X4 ST (CAST SUPPLIES) IMPLANT
PADDING CAST COTTON 4X4 STRL (CAST SUPPLIES) ×2
PADDING CAST COTTON 6X4 STRL (CAST SUPPLIES) ×2 IMPLANT
PENCIL BUTTON HOLSTER BLD 10FT (ELECTRODE) ×2 IMPLANT
PLATE ACE 100DEG 6HOLE (Plate) ×1 IMPLANT
SANITIZER HAND PURELL 535ML FO (MISCELLANEOUS) ×2 IMPLANT
SCREW CORTICAL 3.5MM  16MM (Screw) ×1 IMPLANT
SCREW CORTICAL 3.5MM  20MM (Screw) ×1 IMPLANT
SCREW CORTICAL 3.5MM 16MM (Screw) IMPLANT
SCREW CORTICAL 3.5MM 18MM (Screw) ×3 IMPLANT
SCREW CORTICAL 3.5MM 20MM (Screw) IMPLANT
SCREW CORTICAL 3.5MM 26MM (Screw) ×1 IMPLANT
SHEET MEDIUM DRAPE 40X70 STRL (DRAPES) ×2 IMPLANT
SLEEVE SCD COMPRESS KNEE MED (MISCELLANEOUS) ×2 IMPLANT
SPLINT FAST PLASTER 5X30 (CAST SUPPLIES) ×20
SPLINT PLASTER CAST FAST 5X30 (CAST SUPPLIES) ×20 IMPLANT
SPONGE LAP 18X18 RF (DISPOSABLE) ×2 IMPLANT
STOCKINETTE 6  STRL (DRAPES) ×1
STOCKINETTE 6 STRL (DRAPES) ×1 IMPLANT
SUCTION FRAZIER HANDLE 10FR (MISCELLANEOUS) ×1
SUCTION TUBE FRAZIER 10FR DISP (MISCELLANEOUS) ×1 IMPLANT
SUT ETHILON 3 0 PS 1 (SUTURE) ×2 IMPLANT
SUT FIBERWIRE #2 38 T-5 BLUE (SUTURE)
SUT MNCRL AB 3-0 PS2 18 (SUTURE) IMPLANT
SUT VIC AB 0 SH 27 (SUTURE) IMPLANT
SUT VIC AB 2-0 SH 27 (SUTURE) ×2
SUT VIC AB 2-0 SH 27XBRD (SUTURE) ×1 IMPLANT
SUTURE FIBERWR #2 38 T-5 BLUE (SUTURE) IMPLANT
SYR BULB 3OZ (MISCELLANEOUS) ×2 IMPLANT
SYR CONTROL 10ML LL (SYRINGE) IMPLANT
TOWEL GREEN STERILE FF (TOWEL DISPOSABLE) ×4 IMPLANT
TUBE CONNECTING 20X1/4 (TUBING) ×2 IMPLANT
UNDERPAD 30X30 (UNDERPADS AND DIAPERS) ×2 IMPLANT

## 2017-11-01 NOTE — Anesthesia Procedure Notes (Signed)
Anesthesia Regional Block: Popliteal block   Pre-Anesthetic Checklist: ,, timeout performed, Correct Patient, Correct Site, Correct Laterality, Correct Procedure, Correct Position, site marked, Risks and benefits discussed,  Surgical consent,  Pre-op evaluation,  At surgeon's request and post-op pain management  Laterality: Right  Prep: chloraprep       Needles:  Injection technique: Single-shot  Needle Type: Stimiplex     Needle Length: 9cm  Needle Gauge: 21     Additional Needles:   Procedures:,,,, ultrasound used (permanent image in chart),,,,  Narrative:  Start time: 11/01/2017 12:15 PM End time: 11/01/2017 12:20 PM Injection made incrementally with aspirations every 5 mL.  Performed by: Personally  Anesthesiologist: Lynda Rainwater, MD

## 2017-11-01 NOTE — Discharge Instructions (Addendum)
Anna Simmer, MD [place]  Please read the following information regarding your care after surgery.  Medications  You only need a prescription for the narcotic pain medicine (ex. oxycodone, Percocet, Norco).  All of the other medicines listed below are available over the counter. X Aleve 2 pills twice a day for the first 3 days after surgery. X acetominophen (Tylenol) 650 mg every 4-6 hours as you need for minor to moderate pain X oxycodone as prescribed for severe pain  Narcotic pain medicine (ex. oxycodone, Percocet, Vicodin) will cause constipation.  To prevent this problem, take the following medicines while you are taking any pain medicine. X docusate sodium (Colace) 100 mg twice a day X senna (Senokot) 2 tablets twice a day  X To help prevent blood clots, take a baby aspirin (81 mg) twice a day for two weeks after surgery.  You should also get up every hour while you are awake to move around.    Weight Bearing ? Bear weight when you are able on your operated leg or foot. ? Bear weight only on your operated foot in the post-op shoe. X Do not bear any weight on the operated leg or foot.  Cast / Splint / Dressing X Keep your splint, cast or dressing clean and dry.  Dont put anything (coat hanger, pencil, etc) down inside of it.  If it gets damp, use a hair dryer on the cool setting to dry it.  If it gets soaked, call the office to schedule an appointment for a cast change. ? Remove your dressing 3 days after surgery and cover the incisions with dry dressings.    After your dressing, cast or splint is removed; you may shower, but do not soak or scrub the wound.  Allow the water to run over it, and then gently pat it dry.  Swelling It is normal for you to have swelling where you had surgery.  To reduce swelling and pain, keep your toes above your nose for at least 3 days after surgery.  It may be necessary to keep your foot or leg elevated for several weeks.  If it hurts,  it should be elevated.  Follow Up Call my office at [phone] when you are discharged from the hospital or surgery center to schedule an appointment to be seen two weeks after surgery.  Call my office at [phone] if you develop a fever >101.5 F, nausea, vomiting, bleeding from the surgical site or severe pain.      Post Anesthesia Home Care Instructions  Activity: Get plenty of rest for the remainder of the day. A responsible individual must stay with you for 24 hours following the procedure.  For the next 24 hours, DO NOT: -Drive a car -Paediatric nurse -Drink alcoholic beverages -Take any medication unless instructed by your physician -Make any legal decisions or sign important papers.  Meals: Start with liquid foods such as gelatin or soup. Progress to regular foods as tolerated. Avoid greasy, spicy, heavy foods. If nausea and/or vomiting occur, drink only clear liquids until the nausea and/or vomiting subsides. Call your physician if vomiting continues.  Special Instructions/Symptoms: Your throat may feel dry or sore from the anesthesia or the breathing tube placed in your throat during surgery. If this causes discomfort, gargle with warm salt water. The discomfort should disappear within 24 hours.  If you had a scopolamine patch placed behind your ear for the management of post- operative nausea and/or vomiting:  1. The medication in the  patch is effective for 72 hours, after which it should be removed.  Wrap patch in a tissue and discard in the trash. Wash hands thoroughly with soap and water. 2. You may remove the patch earlier than 72 hours if you experience unpleasant side effects which may include dry mouth, dizziness or visual disturbances. 3. Avoid touching the patch. Wash your hands with soap and water after contact with the patch.   Regional Anesthesia Blocks  1. Numbness or the inability to move the "blocked" extremity may last from 3-48 hours after  placement. The length of time depends on the medication injected and your individual response to the medication. If the numbness is not going away after 48 hours, call your surgeon.  2. The extremity that is blocked will need to be protected until the numbness is gone and the  Strength has returned. Because you cannot feel it, you will need to take extra care to avoid injury. Because it may be weak, you may have difficulty moving it or using it. You may not know what position it is in without looking at it while the block is in effect.  3. For blocks in the legs and feet, returning to weight bearing and walking needs to be done carefully. You will need to wait until the numbness is entirely gone and the strength has returned. You should be able to move your leg and foot normally before you try and bear weight or walk. You will need someone to be with you when you first try to ensure you do not fall and possibly risk injury.  4. Bruising and tenderness at the needle site are common side effects and will resolve in a few days.  5. Persistent numbness or new problems with movement should be communicated to the surgeon or the [facility] [phone] [place] [phone]).  Call your surgeon if you experience:   1.  Fever over 101.0. 2.  Inability to urinate. 3.  Nausea and/or vomiting. 4.  Extreme swelling or bruising at the surgical site. 5.  Continued bleeding from the incision. 6.  Increased pain, redness or drainage from the incision. 7.  Problems related to your pain medication. 8.  Any problems and/or concerns

## 2017-11-01 NOTE — Transfer of Care (Signed)
Immediate Anesthesia Transfer of Care Note  Patient: [name]  Procedure(s) Performed: OPEN REDUCTION INTERNAL FIXATION (ORIF) right lateral malleolus fracture (Right Ankle)  Patient Location: PACU  Anesthesia Type:General and Regional  Level of Consciousness: awake and sedated  Airway & Oxygen Therapy: Patient Spontanous Breathing and Patient connected to face mask oxygen  Post-op Assessment: Report given to RN and Post -op Vital signs reviewed and stable  Post vital signs: Reviewed and stable  Last Vitals:  Vitals Value Taken Time  BP    Temp    Pulse 91 11/01/2017  1:38 PM  Resp 19 11/01/2017  1:38 PM  SpO2 100 % 11/01/2017  1:38 PM  Vitals shown include unvalidated device data.  Last Pain:  Vitals:   11/01/17 1215  TempSrc:   PainSc: 0-No pain         Complications: No apparent anesthesia complications

## 2017-11-01 NOTE — Anesthesia Preprocedure Evaluation (Signed)
Anesthesia Evaluation  Patient identified by MRN, date of birth, ID band Patient awake    Reviewed: Allergy & Precautions, NPO status , Patient's Chart, lab work & pertinent test results  History of Anesthesia Complications Negative for: history of anesthetic complications  Airway Mallampati: II  TM Distance: >3 FB Neck ROM: Full    Dental  (+) Implants, Dental Advisory Given   Pulmonary neg pulmonary ROS,    breath sounds clear to auscultation       Cardiovascular negative cardio ROS   Rhythm:Regular Rate:Normal     Neuro/Psych Anxiety negative neurological ROS     GI/Hepatic negative GI ROS, Neg liver ROS,   Endo/Other  Morbid obesity  Renal/GU negative Renal ROS     Musculoskeletal   Abdominal (+) + obese,   Peds  Hematology  (+) HIV (on antivirals),   Anesthesia Other Findings   Reproductive/Obstetrics                             Anesthesia Physical  Anesthesia Plan  ASA: III  Anesthesia Plan: General and Regional   Post-op Pain Management: GA combined w/ Regional for post-op pain   Induction: Intravenous  PONV Risk Score and Plan: 3 and Ondansetron, Dexamethasone and Midazolam  Airway Management Planned: LMA  Additional Equipment:   Intra-op Plan:   Post-operative Plan: Extubation in OR  Informed Consent: I have reviewed the patients History and Physical, chart, labs and discussed the procedure including the risks, benefits and alternatives for the proposed anesthesia with the patient or authorized representative who has indicated his/her understanding and acceptance.   Dental advisory given  Plan Discussed with: CRNA and Surgeon  Anesthesia Plan Comments:         Anesthesia Quick Evaluation

## 2017-11-01 NOTE — Op Note (Signed)
11/01/2017  1:40 PM  PATIENT:  [name]  42 y.o. female  PRE-OPERATIVE DIAGNOSIS:  Displaced right ankle lateral malleolus fracture  POST-OPERATIVE DIAGNOSIS:  same  Procedure(s): 1.  Open treatment of right ankle lateral malleolus fracture with internal fixation 2.  Stress exam of right ankle under fluoro 3.  AP, mortise and lateral xrays of the right ankle  SURGEON:  Wylene Simmer, MD  ASSISTANT: n/a  ANESTHESIA:   General, regional  EBL:  minimal   TOURNIQUET:   Total Tourniquet Time Documented: Thigh (Right) - 19 minutes Total: Thigh (Right) - 19 minutes  COMPLICATIONS:  None apparent  DISPOSITION:  Extubated, awake and stable to recovery.  INDICATION FOR PROCEDURE: The patient is a 42 year old female who injured her right ankle approximately 1 week ago.  She was found in the emergency department to have a displaced lateral malleolus fracture.  She presents now for operative treatment of this displaced and unstable ankle injury.  The risks and benefits of the alternative treatment options have been discussed in detail.  The patient wishes to proceed with surgery and specifically understands risks of bleeding, infection, nerve damage, blood clots, need for additional surgery, amputation and death.  PROCEDURE IN DETAIL:  After pre operative consent was obtained, and the correct operative site was identified, the patient was brought to the operating room and placed supine on the OR table.  Anesthesia was administered.  Pre-operative antibiotics were administered.  A surgical timeout was taken. The right lower extremity was prepped and draped in standard sterile fashion with a tourniquet around the thigh.  The extremity was elevated and the tourniquet was inflated to 350 mmHg.  A longitudinal incision was then made over the lateral malleolus.  Dissection was carried down through the subcutaneous tissues to the fracture site.  Fracture site was mobilized and cleaned of all soft  tissues interposed in the fracture.  The wound was irrigated.  The fracture was reduced and held with a tenaculum.  A 3.5 mm fully threaded lag screw was inserted from posterior to anterior across the fracture site.  A 6-hole one third tubular plate from the [employer] small frag set was selected.  It was contoured to fit the lateral malleolus.  It was secured distally with 2 unicortical screws and proximally with 3 bicortical screws.  AP, mortise and lateral radiographs of the ankle show appropriate reduction of the fracture in appropriate position and length of all hardware.  Stress examination was then performed showing no evidence of syndesmotic injury.  The wound was irrigated copiously.  Subtenons tissues were approximated with Vicryl.  The skin was closed with nylon.  Sterile dressings were applied followed by a well-padded short leg splint.  The tourniquet was released after application of the dressings.  The patient was awakened from anesthesia and transported to the recovery room in stable condition.   FOLLOW UP PLAN: Nonweightbearing on the right lower extremity.  Aspirin 81 mg p.o. twice daily for DVT prophylaxis.  Follow-up in the office in 2 weeks for suture removal and conversion to a cam walker boot and compression stocking.   RADIOGRAPHS: AP, mortise and lateral radiographs of the right ankle are obtained intraoperatively.  These show interval reduction and fixation of the displaced lateral malleolus fracture.  Hardware is appropriately positioned and of the appropriate lengths.

## 2017-11-01 NOTE — Anesthesia Postprocedure Evaluation (Signed)
Anesthesia Post Note  Patient: [name]  Procedure(s) Performed: OPEN REDUCTION INTERNAL FIXATION (ORIF) right lateral malleolus fracture (Right Ankle)     Patient location during evaluation: PACU Anesthesia Type: Regional and General Level of consciousness: awake and alert Pain management: pain level controlled Vital Signs Assessment: post-procedure vital signs reviewed and stable Respiratory status: spontaneous breathing, nonlabored ventilation and respiratory function stable Cardiovascular status: blood pressure returned to baseline and stable Postop Assessment: no apparent nausea or vomiting Anesthetic complications: no    Last Vitals:  Vitals:   11/01/17 1415 11/01/17 1436  BP: 140/77 (!) 165/70  Pulse: 78 86  Resp: 18 18  Temp:  36.4 C  SpO2: 100% 100%    Last Pain:  Vitals:   11/01/17 1436  TempSrc:   PainSc: 0-No pain                 Lynda Rainwater

## 2017-11-01 NOTE — H&P (Signed)
[name] is an 42 y.o. female.   Chief Complaint: right ankle pain HPI: The patient is a 42 year old female with a right ankle injury approximately 1 week ago.  She has a displaced lateral malleolus fracture and presents now for operative treatment of this unstable injury.  Past Medical History:  Diagnosis Date  . Anxiety   . Candidal esophagitis ([place])   . Complication of anesthesia 07/2011   in PACU took long time for oxygen sts to come up,2008 tonsillectomy went to ICU  . Family history of adverse reaction to anesthesia    mother H/O n/v  . Fracture of right ankle, lateral malleolus   . HIV (human immunodeficiency virus infection) ([place])   . MAC (mycobacterium avium-intracellulare complex)    positive culture, treated for 3 months    Past Surgical History:  Procedure Laterality Date  . ANTERIOR CRUCIATE LIGAMENT REPAIR     Right and left repair  . LYMPH NODE BIOPSY Left 11/14/2016   Procedure: Left Cervical node I/D (incision and drain) and Biopsy;  Surgeon: [name], MD;  Location: [facility];  Service: Thoracic;  Laterality: Left;  . NASAL RECONSTRUCTION  1982  . [place] NODE BIOPSY  07/28/2011   Procedure: SUPRACLAVICAL NODE BIOPSY;  Surgeon: Nicanor Alcon, MD;  Location: [facility];  Service: Thoracic;  Laterality: Left;  . TONSILLECTOMY      Family History  Problem Relation Age of Onset  . Anesthesia problems Neg Hx   . Hypotension Neg Hx   . Malignant hyperthermia Neg Hx   . Pseudochol deficiency Neg Hx    Social History:  reports that she has never smoked. She has never used smokeless tobacco. She reports that she drinks alcohol. She reports that she does not use drugs.  Allergies: No Known Allergies  Medications Prior to Admission  Medication Sig Dispense Refill  . ALPRAZolam (XANAX) 0.25 MG tablet Take 0.25 mg by mouth 3 (three) times daily as needed. Dr. Stephanie Acre    . cetirizine (ZYRTEC) 10 MG tablet Take 10 mg by mouth daily as needed for  allergies.     Marland Kitchen ethambutol (MYAMBUTOL) 400 MG tablet Take 4 tablets (1,600 mg total) by mouth daily. 120 tablet 5  . GENVOYA 150-150-200-10 MG TABS tablet TAKE 1 TABLET BY MOUTH DAILY 30 tablet 5  . HYDROcodone-acetaminophen (NORCO/VICODIN) 5-325 MG tablet Take 1 tablet by mouth every 6 (six) hours as needed for moderate pain. 10 tablet 0  . PREZISTA 800 MG tablet TAKE 1 TABLET(800 MG) BY MOUTH DAILY 30 tablet 5  . zolpidem (AMBIEN) 10 MG tablet Take 10 mg by mouth at bedtime as needed for sleep.      No results found for this or any previous visit (from the past 48 hour(s)). No results found.  ROS no recent fever, chills, nausea, vomiting or changes in her appetite  Blood pressure (!) 151/89, pulse 86, temperature 98.5 F (36.9 C), temperature source Oral, resp. rate 17, height _0  (1.778 m), weight (!) 137.3 kg (302 lb 12.8 oz), last menstrual period 10/16/2017, SpO2 99 %. Physical Exam  Well-nourished well-developed woman in no apparent distress.  Alert and oriented x4.  Mood and affect are normal.  Extraocular motions are intact.  Respirations are unlabored.  Gait is nonweightbearing on the right.  Right lower extremity has healthy skin and palpable pulses.  Intact sensibility to light touch.  Moderate swelling about the ankle.  No lymphadenopathy.  5 out of 5 strength in plantar flexion  and dorsiflexion of the toes.  Assessment/Plan Right ankle lateral malleolus fracture -to the operating room for open treatment of the displaced lateral malleolus fracture with internal fixation.  The risks and benefits of the alternative treatment options have been discussed in detail.  The patient wishes to proceed with surgery and specifically understands risks of bleeding, infection, nerve damage, blood clots, need for additional surgery, amputation and death.   Wylene Simmer, MD 2017-11-30, 12:19 PM

## 2017-11-01 NOTE — Progress Notes (Signed)
Assisted dr Sabra Heck with ultrasound guided right popliteal  block. Side rails up, monitors on throughout procedure. See vital signs in flow sheet. Tolerated Procedure well.

## 2017-11-01 NOTE — Anesthesia Procedure Notes (Signed)
Procedure Name: LMA Insertion Performed by: Jedi Catalfamo W, CRNA Pre-anesthesia Checklist: Patient identified, Emergency Drugs available, Suction available and Patient being monitored Patient Re-evaluated:Patient Re-evaluated prior to induction Oxygen Delivery Method: Circle system utilized Preoxygenation: Pre-oxygenation with 100% oxygen Induction Type: IV induction Ventilation: Mask ventilation without difficulty LMA: LMA inserted LMA Size: 4.0 Number of attempts: 1 Placement Confirmation: positive ETCO2 Tube secured with: Tape Dental Injury: Teeth and Oropharynx as per pre-operative assessment        

## 2017-11-02 ENCOUNTER — Encounter (HOSPITAL_BASED_OUTPATIENT_CLINIC_OR_DEPARTMENT_OTHER): Payer: Self-pay | Admitting: Orthopedic Surgery

## 2017-11-09 ENCOUNTER — Other Ambulatory Visit: Payer: Self-pay

## 2017-11-09 ENCOUNTER — Encounter (HOSPITAL_COMMUNITY): Payer: Self-pay | Admitting: Nurse Practitioner

## 2017-11-09 ENCOUNTER — Emergency Department (HOSPITAL_COMMUNITY): Payer: No Typology Code available for payment source

## 2017-11-09 ENCOUNTER — Inpatient Hospital Stay (HOSPITAL_COMMUNITY)
Admission: EM | Admit: 2017-11-09 | Discharge: 2017-11-12 | DRG: 176 | Disposition: A | Discharge status: Home / Self Care | Payer: No Typology Code available for payment source | Attending: Family Medicine | Admitting: Family Medicine

## 2017-11-09 DIAGNOSIS — I82431 Acute embolism and thrombosis of right popliteal vein: Secondary | ICD-10-CM | POA: Diagnosis present

## 2017-11-09 DIAGNOSIS — E669 Obesity, unspecified: Secondary | ICD-10-CM | POA: Diagnosis not present

## 2017-11-09 DIAGNOSIS — Z6841 Body Mass Index (BMI) 40.0 and over, adult: Secondary | ICD-10-CM | POA: Diagnosis not present

## 2017-11-09 DIAGNOSIS — I2602 Saddle embolus of pulmonary artery with acute cor pulmonale: Secondary | ICD-10-CM | POA: Diagnosis not present

## 2017-11-09 DIAGNOSIS — B20 Human immunodeficiency virus [HIV] disease: Secondary | ICD-10-CM | POA: Diagnosis present

## 2017-11-09 DIAGNOSIS — M79609 Pain in unspecified limb: Secondary | ICD-10-CM | POA: Diagnosis not present

## 2017-11-09 DIAGNOSIS — R0602 Shortness of breath: Secondary | ICD-10-CM | POA: Diagnosis not present

## 2017-11-09 DIAGNOSIS — I2699 Other pulmonary embolism without acute cor pulmonale: Secondary | ICD-10-CM | POA: Diagnosis not present

## 2017-11-09 DIAGNOSIS — I82401 Acute embolism and thrombosis of unspecified deep veins of right lower extremity: Secondary | ICD-10-CM

## 2017-11-09 DIAGNOSIS — I160 Hypertensive urgency: Secondary | ICD-10-CM | POA: Diagnosis present

## 2017-11-09 DIAGNOSIS — R079 Chest pain, unspecified: Secondary | ICD-10-CM | POA: Diagnosis not present

## 2017-11-09 DIAGNOSIS — M7989 Other specified soft tissue disorders: Secondary | ICD-10-CM | POA: Diagnosis not present

## 2017-11-09 DIAGNOSIS — I361 Nonrheumatic tricuspid (valve) insufficiency: Secondary | ICD-10-CM | POA: Diagnosis not present

## 2017-11-09 HISTORY — DX: Other pulmonary embolism without acute cor pulmonale: I26.99

## 2017-11-09 LAB — CBC
HEMATOCRIT: 41 % (ref 36.0–46.0)
HEMOGLOBIN: 14.1 g/dL (ref 12.0–15.0)
MCH: 30.7 pg (ref 26.0–34.0)
MCHC: 34.4 g/dL (ref 30.0–36.0)
MCV: 89.3 fL (ref 78.0–100.0)
Platelets: 265 10*3/uL (ref 150–400)
RBC: 4.59 MIL/uL (ref 3.87–5.11)
RDW: 13.2 % (ref 11.5–15.5)
WBC: 10.1 10*3/uL (ref 4.0–10.5)

## 2017-11-09 LAB — I-STAT TROPONIN, ED: Troponin i, poc: 0.22 ng/mL (ref 0.00–0.08)

## 2017-11-09 LAB — BASIC METABOLIC PANEL
Anion gap: 8 (ref 5–15)
BUN: 14 mg/dL (ref 6–20)
CHLORIDE: 106 mmol/L (ref 98–111)
CO2: 26 mmol/L (ref 22–32)
Calcium: 9.6 mg/dL (ref 8.9–10.3)
Creatinine, Ser: 0.92 mg/dL (ref 0.44–1.00)
GFR calc Af Amer: >60 mL/min (ref >60)
GFR calc non Af Amer: >60 mL/min (ref >60)
Glucose, Bld: 110 mg/dL — ABNORMAL HIGH (ref 70–99)
POTASSIUM: 4.1 mmol/L (ref 3.5–5.1)
SODIUM: 140 mmol/L (ref 135–145)

## 2017-11-09 MED ORDER — IOPAMIDOL (ISOVUE-370) INJECTION 76%
INTRAVENOUS | Status: AC
  Filled 2017-11-09: qty 100

## 2017-11-09 MED ORDER — SODIUM CHLORIDE 0.9 % IV BOLUS
500.0000 mL | Freq: Once | INTRAVENOUS | Status: AC
  Administered 2017-11-10: 500 mL via INTRAVENOUS

## 2017-11-09 NOTE — ED Notes (Signed)
Bed: WA05 Expected date:  Expected time:  Means of arrival:  Comments: 

## 2017-11-09 NOTE — ED Triage Notes (Signed)
Pt is c/o sudden mid chest pain/tightness while she was in the shower. Pt reports she had an ORIF to left ankle about a week ago, and has been taking 81 mg Asprin.

## 2017-11-09 NOTE — ED Provider Notes (Signed)
[facility] DEPT Provider Note  CSN: [id number] Arrival date & time: 11/09/17 2120  Chief Complaint(s) Chest Pain  HPI [name] is a 42 y.o. female   The history is provided by the patient.  Chest Pain   This is a new problem. Episode onset: 3 hrs. The problem occurs constantly. The pain is present in the substernal region. The pain is moderate. Quality: tightness. The pain does not radiate. Associated symptoms include leg pain, lower extremity edema and shortness of breath. Pertinent negatives include no back pain, no cough, no exertional chest pressure, no fever, no hemoptysis, no irregular heartbeat, no malaise/fatigue and no nausea. Risk factors include obesity (recent ORIF of right ankle).  Pertinent negatives for past medical history include no diabetes, no DVT, no hyperlipidemia, no hypertension, no MI, no PE, no strokes and no TIA.    Past Medical History Past Medical History:  Diagnosis Date  . Anxiety   . Candidal esophagitis ([place])   . Complication of anesthesia 07/2011   in PACU took long time for oxygen sts to come up,2008 tonsillectomy went to ICU  . Family history of adverse reaction to anesthesia    mother H/O n/v  . Fracture of right ankle, lateral malleolus   . HIV (human immunodeficiency virus infection) ([place])   . MAC (mycobacterium avium-intracellulare complex)    positive culture, treated for 3 months   Patient Active Problem List   Diagnosis Date Noted  . Granulomatous adenopathy 12/21/2016  . Adenopathy 10/03/2016  . Screening examination for venereal disease 08/06/2013  . Encounter for long-term (current) use of high-risk medication 08/06/2013  . HIV disease ([place]) 12/29/2010  . Anxiety 12/29/2010   Home Medication(s) Prior to Admission medications   Medication Sig Start Date End Date Taking? Authorizing Provider  GENVOYA 150-150-200-10 MG TABS tablet TAKE 1 TABLET BY MOUTH DAILY 06/25/17  Yes Comer, Okey Regal, MD    PREZISTA 800 MG tablet TAKE 1 TABLET(800 MG) BY MOUTH DAILY 06/25/17  Yes Comer, Okey Regal, MD  ALPRAZolam Duanne Moron) 0.25 MG tablet Take 0.25 mg by mouth 3 (three) times daily as needed. Dr. Stephanie Acre    [provider]  cetirizine (ZYRTEC) 10 MG tablet Take 10 mg by mouth daily as needed for allergies.     [provider]  ethambutol (MYAMBUTOL) 400 MG tablet Take 4 tablets (1,600 mg total) by mouth daily. 12/06/16   Comer, Okey Regal, MD  oxyCODONE (ROXICODONE) 5 MG immediate release tablet Take 1 tablet (5 mg total) by mouth every 6 (six) hours as needed for severe pain. 11/01/17   Wylene Simmer, MD  zolpidem (AMBIEN) 10 MG tablet Take 10 mg by mouth at bedtime as needed for sleep.    [provider]  Past Surgical History Past Surgical History:  Procedure Laterality Date  . ANTERIOR CRUCIATE LIGAMENT REPAIR     Right and left repair  . LYMPH NODE BIOPSY Left 11/14/2016   Procedure: Left Cervical node I/D (incision and drain) and Biopsy;  Surgeon: [name], MD;  Location: [facility];  Service: Thoracic;  Laterality: Left;  . NASAL RECONSTRUCTION  1982  . ORIF ANKLE FRACTURE Right 11/01/2017   Procedure: OPEN REDUCTION INTERNAL FIXATION (ORIF) right lateral malleolus fracture;  Surgeon: Wylene Simmer, MD;  Location: [facility];  Service: Orthopedics;  Laterality: Right;  . [place] NODE BIOPSY  07/28/2011   Procedure: SUPRACLAVICAL NODE BIOPSY;  Surgeon: Nicanor Alcon, MD;  Location: [facility];  Service: Thoracic;  Laterality: Left;  . TONSILLECTOMY     Family History Family History  Problem Relation Age of Onset  . Anesthesia problems Neg Hx   . Hypotension Neg Hx   . Malignant hyperthermia Neg Hx   . Pseudochol deficiency Neg Hx     Social History Social History   Tobacco Use  . Smoking status: Never Smoker  .  Smokeless tobacco: Never Used  Substance Use Topics  . Alcohol use: Yes    Comment: socially  . Drug use: No   Allergies Patient has no known allergies.  Review of Systems Review of Systems  Constitutional: Negative for fever and malaise/fatigue.  Respiratory: Positive for shortness of breath. Negative for cough and hemoptysis.   Cardiovascular: Positive for chest pain.  Gastrointestinal: Negative for nausea.  Musculoskeletal: Negative for back pain.   All other systems are reviewed and are negative for acute change except as noted in the HPI  Physical Exam Vital Signs  I have reviewed the triage vital signs BP (!) 146/102   Pulse (!) 104   Temp 98.6 F (37 C) (Oral)   Resp 18   Ht _0  (1.778 m)   Wt 131.5 kg (290 lb)   LMP 10/17/2017   SpO2 99%   BMI 41.61 kg/m   Physical Exam  Constitutional: She is oriented to person, place, and time. She appears well-developed and well-nourished. No distress.  HENT:  Head: Normocephalic and atraumatic.  Nose: Nose normal.  Eyes: Pupils are equal, round, and reactive to light. Conjunctivae and EOM are normal. Right eye exhibits no discharge. Left eye exhibits no discharge. No scleral icterus.  Neck: Normal range of motion. Neck supple.  Cardiovascular: Normal rate and regular rhythm. Exam reveals no gallop and no friction rub.  No murmur heard. Pulmonary/Chest: Effort normal and breath sounds normal. No stridor. No respiratory distress. She has no rales.  Abdominal: Soft. She exhibits no distension. There is no tenderness.  Musculoskeletal: She exhibits no edema or tenderness.       Legs: Neurological: She is alert and oriented to person, place, and time.  Skin: Skin is warm and dry. No rash noted. She is not diaphoretic. No erythema.  Psychiatric: She has a normal mood and affect.  Vitals reviewed.   ED Results and Treatments Labs (all labs ordered are listed, but only abnormal results are displayed) Labs Reviewed    BASIC METABOLIC PANEL - Abnormal; Notable for the following components:      Result Value   Glucose, Bld 110 (*)    All other components within normal limits  I-STAT TROPONIN, ED - Abnormal; Notable for the following components:   Troponin i, poc 0.22 (*)    All other components within normal limits  CBC  I-STAT BETA HCG BLOOD, ED ([facility], [facility], [facility] ONLY)                                                                                                                         EKG  EKG Interpretation  Date/Time:  Friday November 09 2017 21:40:31 EDT Ventricular Rate:  98 PR Interval:    QRS Duration: 86 QT Interval:  334 QTC Calculation: 427 R Axis:   70 Text Interpretation:  Sinus rhythm Probable left atrial enlargement Baseline wander in lead(s) I No significant change since last tracing Confirmed by Addison Lank [phone]) on 11/09/2017 10:54:53 PM      Radiology Dg Chest 2 View  Result Date: 11/09/2017 CLINICAL DATA:  Sudden onset chest pain and tightness while in the shower. Internal fixation of the left ankle about a week ago. EXAM: CHEST - 2 VIEW COMPARISON:  11/14/2016 FINDINGS: The heart size and mediastinal contours are within normal limits. Both lungs are clear. The visualized skeletal structures are unremarkable. IMPRESSION: No active cardiopulmonary disease. Electronically Signed   By: Lucienne Capers M.D.   On: 11/09/2017 22:23   Pertinent labs & imaging results that were available during my care of the patient were reviewed by me and considered in my medical decision making (see chart for details).  Medications Ordered in ED Medications  sodium chloride 0.9 % bolus 500 mL (500 mLs Intravenous New Bag/Given 11/10/17 0012)  iopamidol (ISOVUE-370) 76 % injection (has no administration in time range)  iopamidol (ISOVUE-370) 76 % injection 100 mL (100 mLs Intravenous Contrast Given 11/10/17 0034)                                                                                                                                     Procedures .Critical Care Performed by: Fatima Blank, MD Authorized by: Fatima Blank, MD     CRITICAL CARE Performed by: Grayce Sessions Khalila Buechner Total critical care time: 35 minutes Critical care time was exclusive of separately billable procedures and treating other patients. Critical care was necessary to treat or prevent imminent or life-threatening deterioration. Critical care was time spent personally by me on the following activities: development of treatment plan with patient and/or surrogate as well as nursing, discussions with consultants, evaluation of patient's response to treatment, examination of patient, obtaining history from patient or surrogate, ordering and performing treatments and interventions, ordering and review of  laboratory studies, ordering and review of radiographic studies, pulse oximetry and re-evaluation of patient's condition.   (including critical care time)  Medical Decision Making / ED Course I have reviewed the nursing notes for this encounter and the patient's prior records (if available in EHR or on provided paperwork).    Sudden onset of chest tightness and shortness of breath in the setting of recent ORIF of the right ankle.  Most suspicious for pulmonary embolism.  EKG without acute ischemic changes or evidence of pericarditis.  Initial troponin elevated at 0.22.  CBC without leukocytosis or anemia.  No electrolyte derangements or renal insufficiency on metabolic panel.  CTA obtained confirming saddle embolism with evidence of right heart strain.  Patient is hemodynamically stable without hypoxia on room air.  Heparin drip started.  Will discuss case with medicine for admission and continued management.    Final Clinical Impression(s) / ED Diagnoses Final diagnoses:  Acute saddle pulmonary embolism with acute cor pulmonale ([place])      This chart was dictated using voice recognition  software.  Despite best efforts to proofread,  errors can occur which can change the documentation meaning.   Fatima Blank, MD 11/10/17 [phone]

## 2017-11-09 NOTE — ED Notes (Signed)
Bed: [facility]PT1 Expected date:  Expected time:  Means of arrival:  Comments: 

## 2017-11-10 ENCOUNTER — Inpatient Hospital Stay (HOSPITAL_COMMUNITY): Payer: No Typology Code available for payment source

## 2017-11-10 ENCOUNTER — Emergency Department (HOSPITAL_COMMUNITY): Payer: No Typology Code available for payment source

## 2017-11-10 ENCOUNTER — Encounter (HOSPITAL_COMMUNITY): Payer: Self-pay | Admitting: Pulmonary Disease

## 2017-11-10 DIAGNOSIS — E669 Obesity, unspecified: Secondary | ICD-10-CM | POA: Diagnosis present

## 2017-11-10 DIAGNOSIS — I82431 Acute embolism and thrombosis of right popliteal vein: Secondary | ICD-10-CM | POA: Diagnosis not present

## 2017-11-10 DIAGNOSIS — M79609 Pain in unspecified limb: Secondary | ICD-10-CM

## 2017-11-10 DIAGNOSIS — I361 Nonrheumatic tricuspid (valve) insufficiency: Secondary | ICD-10-CM

## 2017-11-10 DIAGNOSIS — M7989 Other specified soft tissue disorders: Secondary | ICD-10-CM

## 2017-11-10 DIAGNOSIS — Z6841 Body Mass Index (BMI) 40.0 and over, adult: Secondary | ICD-10-CM | POA: Diagnosis not present

## 2017-11-10 DIAGNOSIS — R0602 Shortness of breath: Secondary | ICD-10-CM | POA: Diagnosis not present

## 2017-11-10 DIAGNOSIS — I160 Hypertensive urgency: Secondary | ICD-10-CM | POA: Diagnosis present

## 2017-11-10 DIAGNOSIS — B20 Human immunodeficiency virus [HIV] disease: Secondary | ICD-10-CM | POA: Diagnosis not present

## 2017-11-10 DIAGNOSIS — I2699 Other pulmonary embolism without acute cor pulmonale: Secondary | ICD-10-CM | POA: Diagnosis present

## 2017-11-10 DIAGNOSIS — R079 Chest pain, unspecified: Secondary | ICD-10-CM | POA: Diagnosis present

## 2017-11-10 LAB — TROPONIN I
Troponin I: 0.77 ng/mL (ref <0.03)
Troponin I: 0.61 ng/mL (ref <0.03)

## 2017-11-10 LAB — ECHOCARDIOGRAM COMPLETE
HEIGHTINCHES: 70 in
WEIGHTICAEL: 4850.12 [oz_av]

## 2017-11-10 LAB — HEPARIN LEVEL (UNFRACTIONATED)
HEPARIN UNFRACTIONATED: 0.76 [IU]/mL — AB (ref 0.30–0.70)
Heparin Unfractionated: 0.66 IU/mL (ref 0.30–0.70)
Heparin Unfractionated: 0.75 IU/mL — ABNORMAL HIGH (ref 0.30–0.70)

## 2017-11-10 LAB — CBC
HEMATOCRIT: 40.1 % (ref 36.0–46.0)
Hemoglobin: 13.4 g/dL (ref 12.0–15.0)
MCH: 29.8 pg (ref 26.0–34.0)
MCHC: 33.4 g/dL (ref 30.0–36.0)
MCV: 89.3 fL (ref 78.0–100.0)
Platelets: 276 10*3/uL (ref 150–400)
RBC: 4.49 MIL/uL (ref 3.87–5.11)
RDW: 13.3 % (ref 11.5–15.5)
WBC: 10.2 10*3/uL (ref 4.0–10.5)

## 2017-11-10 LAB — PROTIME-INR
INR: 0.87
Prothrombin Time: 11.8 seconds (ref 11.4–15.2)

## 2017-11-10 LAB — APTT: aPTT: 29 seconds (ref 24–36)

## 2017-11-10 MED ORDER — IBUPROFEN 200 MG PO TABS
600.0000 mg | ORAL_TABLET | Freq: Four times a day (QID) | ORAL | Status: DC | PRN
  Administered 2017-11-10 – 2017-11-11 (×5): 600 mg via ORAL
  Filled 2017-11-10 (×5): qty 3

## 2017-11-10 MED ORDER — ACETAMINOPHEN 650 MG RE SUPP: 650.0000 mg | Freq: Four times a day (QID) | RECTAL | Status: DC | PRN

## 2017-11-10 MED ORDER — ONDANSETRON HCL 4 MG PO TABS: 4.0000 mg | ORAL_TABLET | Freq: Four times a day (QID) | ORAL | Status: DC | PRN

## 2017-11-10 MED ORDER — ACETAMINOPHEN 325 MG PO TABS
650.0000 mg | ORAL_TABLET | Freq: Four times a day (QID) | ORAL | Status: DC | PRN
  Administered 2017-11-10 – 2017-11-11 (×2): 650 mg via ORAL
  Filled 2017-11-10 (×2): qty 2

## 2017-11-10 MED ORDER — TRAMADOL HCL 50 MG PO TABS
50.0000 mg | ORAL_TABLET | Freq: Four times a day (QID) | ORAL | Status: DC | PRN
  Administered 2017-11-10: 50 mg via ORAL
  Filled 2017-11-10: qty 1

## 2017-11-10 MED ORDER — COUMADIN BOOK
Freq: Once | Status: AC
  Administered 2017-11-10: 17:00:00
  Filled 2017-11-10: qty 1

## 2017-11-10 MED ORDER — HEPARIN (PORCINE) IN NACL 100-0.45 UNIT/ML-% IJ SOLN: 1700.0000 [IU]/h | INTRAMUSCULAR | Status: DC

## 2017-11-10 MED ORDER — WARFARIN VIDEO
Freq: Once | Status: AC
  Administered 2017-11-10: 17:00:00

## 2017-11-10 MED ORDER — HEPARIN BOLUS VIA INFUSION
5000.0000 [IU] | Freq: Once | INTRAVENOUS | Status: AC
  Administered 2017-11-10: 5000 [IU] via INTRAVENOUS
  Filled 2017-11-10: qty 5000

## 2017-11-10 MED ORDER — HYDRALAZINE HCL 20 MG/ML IJ SOLN
5.0000 mg | Freq: Four times a day (QID) | INTRAMUSCULAR | Status: DC | PRN
  Administered 2017-11-10 (×2): 5 mg via INTRAVENOUS
  Filled 2017-11-10 (×2): qty 1

## 2017-11-10 MED ORDER — DARUNAVIR ETHANOLATE 800 MG PO TABS
800.0000 mg | ORAL_TABLET | Freq: Every day | ORAL | Status: DC
  Administered 2017-11-10 – 2017-11-11 (×2): 800 mg via ORAL
  Filled 2017-11-10 (×2): qty 1

## 2017-11-10 MED ORDER — HEPARIN (PORCINE) IN NACL 100-0.45 UNIT/ML-% IJ SOLN
1600.0000 [IU]/h | INTRAMUSCULAR | Status: DC
  Administered 2017-11-11: 1600 [IU]/h via INTRAVENOUS
  Filled 2017-11-10: qty 250

## 2017-11-10 MED ORDER — WARFARIN - PHARMACIST DOSING INPATIENT
Freq: Every day | Status: DC
  Administered 2017-11-10: 19:00:00

## 2017-11-10 MED ORDER — IOPAMIDOL (ISOVUE-370) INJECTION 76%
100.0000 mL | Freq: Once | INTRAVENOUS | Status: AC | PRN
  Administered 2017-11-10: 100 mL via INTRAVENOUS

## 2017-11-10 MED ORDER — HEPARIN (PORCINE) IN NACL 100-0.45 UNIT/ML-% IJ SOLN
1800.0000 [IU]/h | INTRAMUSCULAR | Status: DC
Start: 2017-11-10 — End: 2017-11-10
  Administered 2017-11-10 (×2): 1800 [IU]/h via INTRAVENOUS
  Filled 2017-11-10 (×2): qty 250

## 2017-11-10 MED ORDER — WARFARIN SODIUM 5 MG PO TABS
10.0000 mg | ORAL_TABLET | Freq: Once | ORAL | Status: AC
  Administered 2017-11-10: 10 mg via ORAL
  Filled 2017-11-10: qty 2

## 2017-11-10 MED ORDER — ELVITEG-COBIC-EMTRICIT-TENOFAF 150-150-200-10 MG PO TABS
1.0000 | ORAL_TABLET | Freq: Every day | ORAL | Status: DC
  Administered 2017-11-10 – 2017-11-11 (×2): 1 via ORAL
  Filled 2017-11-10 (×3): qty 1

## 2017-11-10 MED ORDER — ONDANSETRON HCL 4 MG/2ML IJ SOLN: 4.0000 mg | Freq: Four times a day (QID) | INTRAMUSCULAR | Status: DC | PRN

## 2017-11-10 NOTE — Progress Notes (Signed)
Pharmacy - Brief Note  - see previous note from today for more detailed info  Pharmacy Consult for IV heparin Indication: pulmonary embolus  Confirmatory heparin level at 15:00 SUPRAtherapeutic at 0.75 on heparin at 1800 units/hr - Heparin infusing at correct rate per RN  Plan  Reduce heparin gtt to 1700 units/hr  Re-check heparin level in 6h  Daily heparin level, CBC, INR  Doreene Eland, PharmD, BCPS.   Pager: [phone] 11/10/2017 4:16 PM

## 2017-11-10 NOTE — ED Notes (Signed)
Report given to [facility] on [address]

## 2017-11-10 NOTE — Progress Notes (Signed)
ANTICOAGULATION CONSULT NOTE - Initial Consult  Pharmacy Consult for IV heparin Indication: pulmonary embolus  No Known Allergies  Patient Measurements: Height: _0  (177.8 cm) Weight: 290 lb (131.5 kg) IBW/kg (Calculated) : 68.5 Heparin Dosing Weight: 88 kg  Vital Signs: Temp: 98.6 F (37 C) (07/26 2136) Temp Source: Oral (07/26 2136) BP: 130/119 (07/27 0109) Pulse Rate: 94 (07/27 0109)  Labs: Recent Labs    11/09/17 2218  HGB 14.1  HCT 41.0  PLT 265  CREATININE 0.92    Estimated Creatinine Clearance: 119 mL/min (by C-G formula based on SCr of 0.92 mg/dL).   Medical History: Past Medical History:  Diagnosis Date  . Anxiety   . Candidal esophagitis ([place])   . Complication of anesthesia 07/2011   in PACU took long time for oxygen sts to come up,2008 tonsillectomy went to ICU  . Family history of adverse reaction to anesthesia    mother H/O n/v  . Fracture of right ankle, lateral malleolus   . HIV (human immunodeficiency virus infection) ([place])   . MAC (mycobacterium avium-intracellulare complex)    positive culture, treated for 3 months    Medications:  Scheduled:  . heparin  5,000 Units Intravenous Once  . iopamidol       Infusions:  . heparin      Assessment: 61 yoF c/o sudden onset of chest tightness and SOB in setting of recent ORIF of the right ankle.  CT confirming saddle embolism with evidence of right heart strain.  IV heparin for PE.   Goal of Therapy:  Heparin level 0.3-0.7 units/ml Monitor platelets by anticoagulation protocol: Yes   Plan:  Baseline aPtt and PT/INR STAT Heparin 5000 unit bolus x1 Start drip at 1800 units/hr Daily CBC/HL Check 1st HL in 6 hours  Lawana Pai R 11/10/2017,1:36 AM

## 2017-11-10 NOTE — Progress Notes (Signed)
  Echocardiogram 2D Echocardiogram has been performed.  [name] 11/10/2017, 9:42 AM

## 2017-11-10 NOTE — Progress Notes (Signed)
Preliminary notes--Bilateral lower extremities venous duplex exam completed.  Acute DVT involving right popliteal vein and peroneal veins.  [name] (RDMS RVT) 11/10/17 4:31 PM

## 2017-11-10 NOTE — Progress Notes (Addendum)
ANTICOAGULATION CONSULT NOTE - Follow-up Consult  Pharmacy Consult for IV heparin/warfarin Indication: pulmonary embolus  No Known Allergies  Patient Measurements: Height: 5\' 10"  (177.8 cm) Weight: (!) 303 lb 2.1 oz (137.5 kg) IBW/kg (Calculated) : 68.5 Heparin Dosing Weight: 88 kg  Vital Signs: Temp: 98.6 F (37 C) (07/27 0247) Temp Source: Oral (07/27 0247) BP: 158/103 (07/27 0247) Pulse Rate: 105 (07/27 0247)  Labs: Recent Labs    11/09/17 2218 11/10/17 0202 11/10/17 0430 11/10/17 0832  HGB 14.1  --   --  13.4  HCT 41.0  --   --  40.1  PLT 265  --   --  276  APTT  --  29  --   --   LABPROT  --  11.8  --   --   INR  --  0.87  --   --   HEPARINUNFRC  --   --   --  0.66  CREATININE 0.92  --   --   --   TROPONINI  --   --  0.77*  --     Estimated Creatinine Clearance: 122.1 mL/min (by C-G formula based on SCr of 0.92 mg/dL).   Assessment: 79 yoF c/o sudden onset of chest tightness and SOB in setting of recent ORIF of the right ankle.  CT confirming saddle embolism with evidence of right heart strain.  IV heparin for PE.   Today, 11/10/2017  Initial heparin level therapeutic (= 0.66) on heparin at 1800 units/hr   CBC: Hgb and pltc WNL  Trop elevated - likely from heart strain  ECHO results pending  Goal of Therapy:  Heparin level 0.3-0.7 units/ml Monitor platelets by anticoagulation protocol: Yes   Plan:   Continue heparin at 1800 units/hr  Check confirmatory heparin level this afternoon  Daily heparin level and CBC  Monitor for bleeding complications  NOTE -  Current anti-retroviral therapy regimen complicates use of DOACs. Rivaroxaban needs to be avoided,  Recommendation for apixaban is to use 50% of dose when administered with combined P-gp and strong CYP 3A4 inhibitor.  Warfarin or LMWH may be preferred due these interactions.      Doreene Eland, PharmD, BCPS.   Pager: [phone] 11/10/2017 10:24 AM  Addendum  Discussed with [facility] re: drug  interactions with DOACs,  Plan is for warfarin.  Consult placed for pharmacy to dose.  Will need to complete a minimum of 5-days of overlap therapy with INR >=2 for 48h.  Can use LMWH to complete the bridge therapy as appropriate (enoxaparin 140mg  SQ q12h) Plan 1. Warfarin 10mg  PO x 1 tonight 2. Daily INR 3. Education material  Doreene Eland, PharmD, BCPS.   Pager: [phone] 11/10/2017 3:12 PM

## 2017-11-10 NOTE — Consult Note (Signed)
Name: [name] MRN: [id number] DOB: 10-Jun-1975    ADMISSION DATE:  11/09/2017 CONSULTATION DATE: 11/10/2017  REFERRING MD : Cordelia Poche  CHIEF COMPLAINT: Shortness of breath and chest discomfort  BRIEF PATIENT DESCRIPTION:  Patient had ankle surgery on 17 July, ORIF She developed chest discomfort and associated shortness of breath leading to presentation to the hospital Recent right ankle surgery and immobilization likely predisposed to the thromboembolic event   SIGNIFICANT EVENTS  Chest discomfort Shortness of breath with significant exertion  STUDIES:  CT scan of the chest showing bilateral PE, evidence of strain-reviewed by myself Echocardiogram does reveal evidence of strain    HISTORY OF PRESENT ILLNESS:  Patient presented to the hospital with chest discomfort, associated shortness of breath with activity She recently had ORIF right ankle following a fall.  She tolerated surgery well She was immobilized, noted swelling right lower extremity She does have a diagnosis of HIV for which she is on antiretrovirals  PAST MEDICAL HISTORY :   has a past medical history of Anxiety, Candidal esophagitis ([place]), Complication of anesthesia (07/2011), Family history of adverse reaction to anesthesia, Fracture of right ankle, lateral malleolus, HIV (human immunodeficiency virus infection) ([place]), and MAC (mycobacterium avium-intracellulare complex).  has a past surgical history that includes Tonsillectomy; Anterior cruciate ligament repair; Nasal reconstruction (9373); Supraclavical node biopsy (07/28/2011); Lymph node biopsy (Left, 11/14/2016); and ORIF ankle fracture (Right, 11/01/2017). Prior to Admission medications   Medication Sig Start Date End Date Taking? Authorizing Provider  GENVOYA 150-150-200-10 MG TABS tablet TAKE 1 TABLET BY MOUTH DAILY 06/25/17  Yes Comer, Okey Regal, MD  PREZISTA 800 MG tablet TAKE 1 TABLET(800 MG) BY MOUTH DAILY 06/25/17  Yes Comer, Okey Regal, MD    oxyCODONE (ROXICODONE) 5 MG immediate release tablet Take 1 tablet (5 mg total) by mouth every 6 (six) hours as needed for severe pain. Patient not taking: Reported on 11/10/2017 11/01/17   Wylene Simmer, MD   No Known Allergies  FAMILY HISTORY:  family history is not on file. SOCIAL HISTORY:  reports that she has never smoked. She has never used smokeless tobacco. She reports that she drinks alcohol. She reports that she does not use drugs.  REVIEW OF SYSTEMS:   Constitutional: No fever, no recent weight loss HENT: No hearing loss no nasal stuffiness or congestion  Eyes: denies any visual complaints at present Respiratory: Negative for cough, shortness of breath with exertion, no wheezes, no stridor  Cardiovascular: No palpitations or orthopnea Gastrointestinal: Denies any GI symptoms no nausea no vomiting.  Genitourinary: Negative for dysuria, urgency, frequency, hematuria and flank pain.  Musculoskeletal: Recent ankle surgery Skin: Negative for itching and rash.  Neurological: Negative for dizziness, tingling, tremors, sensory change, speech change, focal weakness, seizures, loss of consciousness, weakness and headaches.  Endo/Heme/Allergies: Does not bruise/bleed easily.  SUBJECTIVE:  Shortness of breath on exertion Some chest discomfort  VITAL SIGNS: Temp:  [98.4 F (36.9 C)-98.6 F (37 C)] 98.4 F (36.9 C) (07/27 1331) Pulse Rate:  [93-108] 93 (07/27 1331) Resp:  [16-32] 24 (07/27 1331) BP: (130-160)/(84-119) 140/100 (07/27 1340) SpO2:  [95 %-100 %] 95 % (07/27 1331) Weight:  [290 lb (131.5 kg)-303 lb 2.1 oz (137.5 kg)] 303 lb 2.1 oz (137.5 kg) (07/27 0247)  PHYSICAL EXAMINATION: General: Awake alert does not appear to be in any distress Neuro: No neurological focality, moving all extremities HEENT: No oral lesions, no nasal lesions Cardiovascular: S1-S2 appreciated with no murmur Lungs: Clear breath sounds bilaterally, no wheezes,  no rales Abdomen: Bowel sounds  appreciated, no tenderness, no organomegaly Musculoskeletal: Does have a cast on right foot Skin: Warm and dry  Recent Labs  Lab 11/09/17 2218  NA 140  K 4.1  CL 106  CO2 26  BUN 14  CREATININE 0.92  GLUCOSE 110*   Recent Labs  Lab 11/09/17 2218 11/10/17 0832  HGB 14.1 13.4  HCT 41.0 40.1  WBC 10.1 10.2  PLT 265 276   Dg Chest 2 View  Result Date: 11/09/2017 CLINICAL DATA:  Sudden onset chest pain and tightness while in the shower. Internal fixation of the left ankle about a week ago. EXAM: CHEST - 2 VIEW COMPARISON:  11/14/2016 FINDINGS: The heart size and mediastinal contours are within normal limits. Both lungs are clear. The visualized skeletal structures are unremarkable. IMPRESSION: No active cardiopulmonary disease. Electronically Signed   By: Lucienne Capers M.D.   On: 11/09/2017 22:23   Ct Angio Chest Pe W And/or Wo Contrast  Result Date: 11/10/2017 CLINICAL DATA:  Chest pain with shortness of breath EXAM: CT ANGIOGRAPHY CHEST WITH CONTRAST TECHNIQUE: Multidetector CT imaging of the chest was performed using the standard protocol during bolus administration of intravenous contrast. Multiplanar CT image reconstructions and MIPs were obtained to evaluate the vascular anatomy. CONTRAST:  133m ISOVUE-370 IOPAMIDOL (ISOVUE-370) INJECTION 76% COMPARISON:  11/09/2017 radiograph, CT chest 12/15/2010 FINDINGS: Cardiovascular: Satisfactory opacification of the pulmonary arteries to the segmental level. Acute embolus within the distal right pulmonary artery with extension of thrombus into right upper and right lower lobe segmental and subsegmental branch vessels. Small amount of thrombus within the right inter lobar artery. Acute embolus within the distal left main pulmonary artery with extension of thrombus into descending lobar and multiple segmental and subsegmental left upper and lower lobe pulmonary arteries. RV LV ratio is elevated at 1.1. Left arch with aberrancy origin right  subclavian artery with retroesophageal course. Normal heart size. No pericardial effusion. Mediastinum/Nodes: Esophagus within normal limits. Heterogenous AP window lymph node measuring up to 11 mm. Midline trachea. No thyroid mass. Lungs/Pleura: Lungs are clear. No pleural effusion or pneumothorax. Upper Abdomen: No acute abnormality. Musculoskeletal: No chest wall abnormality. No acute or significant osseous findings. Review of the MIP images confirms the above findings. IMPRESSION: 1. Positive for acute bilateral PE with CT evidence of right heart strain (RV/LV Ratio = 1.1) consistent with at least submassive (intermediate risk) PE. The presence of right heart strain has been associated with an increased risk of morbidity and mortality. Please activate Code PE by paging [phone] Critical Value/emergent results were called by telephone at the time of interpretation on 11/10/2017 at 1:01 am to Dr. PAddison Lank, who verbally acknowledged these results. Electronically Signed   By: KDonavan FoilM.D.   On: 11/10/2017 01:01   CT scan of the chest was reviewed by myself Echocardiogram results noted  CBC Recent Labs    11/09/17 2218 11/10/17 0832  WBC 10.1 10.2  HGB 14.1 13.4  HCT 41.0 40.1  PLT 265 276   Coag's Recent Labs    11/10/17 0202  APTT 29  INR 0.87   BMET Recent Labs    11/09/17 2218  NA 140  K 4.1  CL 106  CO2 26  BUN 14  CREATININE 0.92  GLUCOSE 110*   Cardiac Enzymes Recent Labs    11/10/17 0430 11/10/17 1113  TROPONINI 0.77* 0.61*    ASSESSMENT / PLAN: Pulmonary embolism Provoked event, following recent surgery-about a week ago Evidence  of right heart strain on echocardiogram and CT scan of the chest Patient is on room air, no shortness of breath at rest, not tachycardic  Her pulmonary embolism will be regarded as submassive, however, she has remained hemodynamically stable, no oxygen requirement Recent surgery within a week  The risk with  thrombolytics is significant based on the clinical picture, recent surgery will be a relative contraindication  Recommendation is anticoagulants Continue heparin at present  Options will include Coumadin-this will remain in the hospital for overlapping Coumadin with heparin  Use of DOAC has to be balanced with use of antiretrovirals, apixaban may be used with dose adjustment  HIV status-compliant with treatment  Provoked event-3 months of treatment with anti-coagulation is recommended  Will follow  Pulmonary and [place] Pager: [phone]  11/10/2017, 2:51 PM

## 2017-11-10 NOTE — Progress Notes (Signed)
CRITICAL VALUE ALERT  Critical Value:  Trop 0.77  Date & Time Notied:  11/10/2017 05:47  Provider Notified: Kennon Holter NP  Orders Received/Actions taken: None at this time

## 2017-11-10 NOTE — ED Notes (Signed)
ED TO INPATIENT HANDOFF REPORT  Name/Age/Gender [name] 42 y.o. female  Code Status    Code Status Orders  (From admission, onward)        Start     Ordered   11/10/17 0151  Full code  Continuous     11/10/17 0156    Code Status History    This patient has a current code status but no historical code status.      Home/SNF/Other Home  Chief Complaint Chest pain  Level of Care/Admitting Diagnosis ED Disposition    ED Disposition Condition Comment   Admit  Hospital Area: [place] [[id number]]  Level of Care: Telemetry [5]  Admit to tele based on following criteria: Other see comments  Comments: PE  Diagnosis: Bilateral pulmonary embolism [facility]) [[id number]]  Admitting Physician: Doreatha Massed  Attending Physician: Etta Quill [phone]  Estimated length of stay: past midnight tomorrow  Certification:: I certify this patient will need inpatient services for at least 2 midnights  PT Class (Do Not Modify): Inpatient [101]  PT Acc Code (Do Not Modify): Private [1]       Medical History Past Medical History:  Diagnosis Date  . Anxiety   . Candidal esophagitis ([place])   . Complication of anesthesia 07/2011   in PACU took long time for oxygen sts to come up,2008 tonsillectomy went to ICU  . Family history of adverse reaction to anesthesia    mother H/O n/v  . Fracture of right ankle, lateral malleolus   . HIV (human immunodeficiency virus infection) ([place])   . MAC (mycobacterium avium-intracellulare complex)    positive culture, treated for 3 months    Allergies No Known Allergies  IV Location/Drains/Wounds Patient Lines/Drains/Airways Status   Active Line/Drains/Airways    Name:   Placement date:   Placement time:   Site:   Days:   Peripheral IV 11/10/17 Right Antecubital   11/10/17    0015    Antecubital   less than 1   Incision (Closed) 11/01/17 Ankle Right   11/01/17    1344     9          Labs/Imaging Results for  orders placed or performed during the hospital encounter of 11/09/17 (from the past 48 hour(s))  Basic metabolic panel     Status: Abnormal   Collection Time: 11/09/17 10:18 PM  Result Value Ref Range   Sodium 140 135 - 145 mmol/L   Potassium 4.1 3.5 - 5.1 mmol/L   Chloride 106 98 - 111 mmol/L   CO2 26 22 - 32 mmol/L   Glucose, Bld 110 (H) 70 - 99 mg/dL   BUN 14 6 - 20 mg/dL   Creatinine, Ser 0.92 0.44 - 1.00 mg/dL   Calcium 9.6 8.9 - 10.3 mg/dL   GFR calc non Af Amer >60 >60 mL/min   GFR calc Af Amer >60 >60 mL/min    Comment: (NOTE) The eGFR has been calculated using the CKD EPI equation. This calculation has not been validated in all clinical situations. eGFR's persistently <60 mL/min signify possible Chronic Kidney Disease.    Anion gap 8 5 - 15    Comment: Performed at [facility], [place] [address] [place], [place] [address]  CBC     Status: None   Collection Time: 11/09/17 10:18 PM  Result Value Ref Range   WBC 10.1 4.0 - 10.5 K/uL   RBC 4.59 3.87 - 5.11 MIL/uL   Hemoglobin  14.1 12.0 - 15.0 g/dL   HCT 41.0 36.0 - 46.0 %   MCV 89.3 78.0 - 100.0 fL   MCH 30.7 26.0 - 34.0 pg   MCHC 34.4 30.0 - 36.0 g/dL   RDW 13.2 11.5 - 15.5 %   Platelets 265 150 - 400 K/uL    Comment: Performed at [facility], [place] [address], [place], [place] [address]  I-stat troponin, ED     Status: Abnormal   Collection Time: 11/09/17 10:24 PM  Result Value Ref Range   Troponin i, poc 0.22 (HH) 0.00 - 0.08 ng/mL   Comment NOTIFIED PHYSICIAN    Comment 3            Comment: Due to the release kinetics of cTnI, a negative result within the first hours of the onset of symptoms does not rule out myocardial infarction with certainty. If myocardial infarction is still suspected, repeat the test at appropriate intervals.    Dg Chest 2 View  Result Date: 11/09/2017 CLINICAL DATA:  Sudden onset chest pain and tightness while in the shower. Internal fixation of  the left ankle about a week ago. EXAM: CHEST - 2 VIEW COMPARISON:  11/14/2016 FINDINGS: The heart size and mediastinal contours are within normal limits. Both lungs are clear. The visualized skeletal structures are unremarkable. IMPRESSION: No active cardiopulmonary disease. Electronically Signed   By: Lucienne Capers M.D.   On: 11/09/2017 22:23   Ct Angio Chest Pe W And/or Wo Contrast  Result Date: 11/10/2017 CLINICAL DATA:  Chest pain with shortness of breath EXAM: CT ANGIOGRAPHY CHEST WITH CONTRAST TECHNIQUE: Multidetector CT imaging of the chest was performed using the standard protocol during bolus administration of intravenous contrast. Multiplanar CT image reconstructions and MIPs were obtained to evaluate the vascular anatomy. CONTRAST:  187m ISOVUE-370 IOPAMIDOL (ISOVUE-370) INJECTION 76% COMPARISON:  11/09/2017 radiograph, CT chest 12/15/2010 FINDINGS: Cardiovascular: Satisfactory opacification of the pulmonary arteries to the segmental level. Acute embolus within the distal right pulmonary artery with extension of thrombus into right upper and right lower lobe segmental and subsegmental branch vessels. Small amount of thrombus within the right inter lobar artery. Acute embolus within the distal left main pulmonary artery with extension of thrombus into descending lobar and multiple segmental and subsegmental left upper and lower lobe pulmonary arteries. RV LV ratio is elevated at 1.1. Left arch with aberrancy origin right subclavian artery with retroesophageal course. Normal heart size. No pericardial effusion. Mediastinum/Nodes: Esophagus within normal limits. Heterogenous AP window lymph node measuring up to 11 mm. Midline trachea. No thyroid mass. Lungs/Pleura: Lungs are clear. No pleural effusion or pneumothorax. Upper Abdomen: No acute abnormality. Musculoskeletal: No chest wall abnormality. No acute or significant osseous findings. Review of the MIP images confirms the above findings.  IMPRESSION: 1. Positive for acute bilateral PE with CT evidence of right heart strain (RV/LV Ratio = 1.1) consistent with at least submassive (intermediate risk) PE. The presence of right heart strain has been associated with an increased risk of morbidity and mortality. Please activate Code PE by paging [phone] Critical Value/emergent results were called by telephone at the time of interpretation on 11/10/2017 at 1:01 am to Dr. PAddison Lank, who verbally acknowledged these results. Electronically Signed   By: KDonavan FoilM.D.   On: 11/10/2017 01:01    Pending Labs Unresulted Labs (From admission, onward)   Start     Ordered   11/10/17 0500  Troponin I (q 6hr x 3)  Now then every  6 hours,   R     11/10/17 0158   11/10/17 0128  APTT  STAT,   R     11/10/17 0127   11/10/17 0128  Protime-INR  STAT,   R     11/10/17 0127      Vitals/Pain Today's Vitals   11/09/17 2204 11/10/17 0016 11/10/17 0109 11/10/17 0205  BP:  (!) 146/102 (!) 130/119 (!) 150/84  Pulse:  (!) 104 94 (!) 108  Resp:  18 (!) 32 (!) 24  Temp:      TempSrc:      SpO2:  99% 100% 98%  Weight:      Height:      PainSc: 8    0-No pain    Isolation Precautions No active isolations  Medications Medications  iopamidol (ISOVUE-370) 76 % injection (has no administration in time range)  heparin ADULT infusion 100 units/mL (25000 units/234m sodium chloride 0.45%) (1,800 Units/hr Intravenous New Bag/Given 11/10/17 0158)  acetaminophen (TYLENOL) tablet 650 mg (has no administration in time range)    Or  acetaminophen (TYLENOL) suppository 650 mg (has no administration in time range)  ondansetron (ZOFRAN) tablet 4 mg (has no administration in time range)    Or  ondansetron (ZOFRAN) injection 4 mg (has no administration in time range)  darunavir (PREZISTA) tablet 800 mg (has no administration in time range)  elvitegravir-cobicistat-emtricitabine-tenofovir (GENVOYA) 150-150-200-10 MG tablet 1 tablet (has no  administration in time range)  sodium chloride 0.9 % bolus 500 mL (0 mLs Intravenous Stopped 11/10/17 0113)  iopamidol (ISOVUE-370) 76 % injection 100 mL (100 mLs Intravenous Contrast Given 11/10/17 0034)  heparin bolus via infusion 5,000 Units (5,000 Units Intravenous Bolus from Bag 11/10/17 0202)    Mobility walks

## 2017-11-10 NOTE — Progress Notes (Signed)
Pharmacy - Brief Note  - see previous note from today for more detailed info  Pharmacy Consult for IV heparin Indication: pulmonary embolus  2205 heparin level, SUPRAtherapeutic at 0.76 on heparin at 1700 units/hr - Heparin infusing at correct rate per RN  Plan  Reduce heparin gtt to 1600 units/hr  Re-check heparin level in 6h  Daily heparin level, CBC, INR  Dorrene German 11/10/2017 11:11 PM

## 2017-11-10 NOTE — H&P (Signed)
History and Physical    MAKENLY LARABEE SWN:[id number] DOB: 1975-05-16 DOA: 11/09/2017  PCP: Jonathon Jordan, MD  Patient coming from: Home  I have personally briefly reviewed patient's old medical records in [place]  Chief Complaint: CP  HPI: GENESSIS FLANARY is a 42 y.o. female with medical history significant of HIV on HAART, just had ORIF of R ankle done by Dr. Jhonnie Garner ~1 week ago.  Still in cast.  Patient presents to the ED with c/o 3 hour onset of substernal CP.  Tightness quality.  Associated with RLE pain, swelling, and SOB.   ED Course: Significant RLE swelling noted.  Trop of 0.22.  Tachy to 110s.  BP 938 systolic.  CTA confirms bilateral PEs and heart strain.   Review of Systems: As per HPI otherwise 10 point review of systems negative.   Past Medical History:  Diagnosis Date  . Anxiety   . Candidal esophagitis ([place])   . Complication of anesthesia 07/2011   in PACU took long time for oxygen sts to come up,2008 tonsillectomy went to ICU  . Family history of adverse reaction to anesthesia    mother H/O n/v  . Fracture of right ankle, lateral malleolus   . HIV (human immunodeficiency virus infection) ([place])   . MAC (mycobacterium avium-intracellulare complex)    positive culture, treated for 3 months    Past Surgical History:  Procedure Laterality Date  . ANTERIOR CRUCIATE LIGAMENT REPAIR     Right and left repair  . LYMPH NODE BIOPSY Left 11/14/2016   Procedure: Left Cervical node I/D (incision and drain) and Biopsy;  Surgeon: [name], MD;  Location: [facility];  Service: Thoracic;  Laterality: Left;  . NASAL RECONSTRUCTION  1982  . ORIF ANKLE FRACTURE Right 11/01/2017   Procedure: OPEN REDUCTION INTERNAL FIXATION (ORIF) right lateral malleolus fracture;  Surgeon: Wylene Simmer, MD;  Location: [place];  Service: Orthopedics;  Laterality: Right;  . [place] NODE BIOPSY  07/28/2011   Procedure: SUPRACLAVICAL NODE BIOPSY;  Surgeon:  Nicanor Alcon, MD;  Location: [place];  Service: Thoracic;  Laterality: Left;  . TONSILLECTOMY       reports that she has never smoked. She has never used smokeless tobacco. She reports that she drinks alcohol. She reports that she does not use drugs.  No Known Allergies  Family History  Problem Relation Age of Onset  . Anesthesia problems Neg Hx   . Hypotension Neg Hx   . Malignant hyperthermia Neg Hx   . Pseudochol deficiency Neg Hx      Prior to Admission medications   Medication Sig Start Date End Date Taking? Authorizing Provider  GENVOYA 150-150-200-10 MG TABS tablet TAKE 1 TABLET BY MOUTH DAILY 06/25/17  Yes Comer, Okey Regal, MD  PREZISTA 800 MG tablet TAKE 1 TABLET(800 MG) BY MOUTH DAILY 06/25/17  Yes Comer, Okey Regal, MD  oxyCODONE (ROXICODONE) 5 MG immediate release tablet Take 1 tablet (5 mg total) by mouth every 6 (six) hours as needed for severe pain. Patient not taking: Reported on 11/10/2017 11/01/17   Wylene Simmer, MD    Physical Exam: Vitals:   11/09/17 2136 11/10/17 0016 11/10/17 0109  BP: (!) 160/101 (!) 146/102 (!) 130/119  Pulse: 96 (!) 104 94  Resp: 16 18 (!) 32  Temp: 98.6 F (37 C)    TempSrc: Oral    SpO2: 96% 99% 100%  Weight: 131.5 kg (290 lb)    Height: _0  (1.778 m)  Constitutional: NAD, calm, comfortable Eyes: PERRL, lids and conjunctivae normal ENMT: Mucous membranes are moist. Posterior pharynx clear of any exudate or lesions.Normal dentition.  Neck: normal, supple, no masses, no thyromegaly Respiratory: clear to auscultation bilaterally, no wheezing, no crackles. Normal respiratory effort. No accessory muscle use.  Cardiovascular: Regular rate and rhythm, no murmurs / rubs / gallops. No extremity edema. 2+ pedal pulses. No carotid bruits.  Abdomen: no tenderness, no masses palpated. No hepatosplenomegaly. Bowel sounds positive.  Musculoskeletal: The RLE is swollen even above the cast to about 2x the size of the left. Skin: no rashes,  lesions, ulcers. No induration Neurologic: CN 2-12 grossly intact. Sensation intact, DTR normal. Strength 5/5 in all 4.  Psychiatric: Normal judgment and insight. Alert and oriented x 3. Normal mood.    Labs on Admission: I have personally reviewed following labs and imaging studies  CBC: Recent Labs  Lab 11/09/17 2218  WBC 10.1  HGB 14.1  HCT 41.0  MCV 89.3  PLT 702   Basic Metabolic Panel: Recent Labs  Lab 11/09/17 2218  NA 140  K 4.1  CL 106  CO2 26  GLUCOSE 110*  BUN 14  CREATININE 0.92  CALCIUM 9.6   GFR: Estimated Creatinine Clearance: 119 mL/min (by C-G formula based on SCr of 0.92 mg/dL). Liver Function Tests: No results for input(s): AST, ALT, ALKPHOS, BILITOT, PROT, ALBUMIN in the last 168 hours. No results for input(s): LIPASE, AMYLASE in the last 168 hours. No results for input(s): AMMONIA in the last 168 hours. Coagulation Profile: No results for input(s): INR, PROTIME in the last 168 hours. Cardiac Enzymes: No results for input(s): CKTOTAL, CKMB, CKMBINDEX, TROPONINI in the last 168 hours. BNP (last 3 results) No results for input(s): PROBNP in the last 8760 hours. HbA1C: No results for input(s): HGBA1C in the last 72 hours. CBG: No results for input(s): GLUCAP in the last 168 hours. Lipid Profile: No results for input(s): CHOL, HDL, LDLCALC, TRIG, CHOLHDL, LDLDIRECT in the last 72 hours. Thyroid Function Tests: No results for input(s): TSH, T4TOTAL, FREET4, T3FREE, THYROIDAB in the last 72 hours. Anemia Panel: No results for input(s): VITAMINB12, FOLATE, FERRITIN, TIBC, IRON, RETICCTPCT in the last 72 hours. Urine analysis:    Component Value Date/Time   COLORURINE AMBER (A) 12/15/2010 0124   APPEARANCEUR CLOUDY (A) 12/15/2010 0124   LABSPEC 1.025 12/15/2010 0124   PHURINE 5.5 12/15/2010 0124   GLUCOSEU NEGATIVE 12/15/2010 0124   HGBUR MODERATE (A) 12/15/2010 0124   BILIRUBINUR NEGATIVE 12/15/2010 0124   KETONESUR TRACE (A) 12/15/2010 0124     PROTEINUR 30 (A) 12/15/2010 0124   UROBILINOGEN 0.2 12/15/2010 0124   NITRITE NEGATIVE 12/15/2010 0124   LEUKOCYTESUR LARGE (A) 12/15/2010 0124    Radiological Exams on Admission: Dg Chest 2 View  Result Date: 11/09/2017 CLINICAL DATA:  Sudden onset chest pain and tightness while in the shower. Internal fixation of the left ankle about a week ago. EXAM: CHEST - 2 VIEW COMPARISON:  11/14/2016 FINDINGS: The heart size and mediastinal contours are within normal limits. Both lungs are clear. The visualized skeletal structures are unremarkable. IMPRESSION: No active cardiopulmonary disease. Electronically Signed   By: Lucienne Capers M.D.   On: 11/09/2017 22:23   Ct Angio Chest Pe W And/or Wo Contrast  Result Date: 11/10/2017 CLINICAL DATA:  Chest pain with shortness of breath EXAM: CT ANGIOGRAPHY CHEST WITH CONTRAST TECHNIQUE: Multidetector CT imaging of the chest was performed using the standard protocol during bolus administration of intravenous contrast.  Multiplanar CT image reconstructions and MIPs were obtained to evaluate the vascular anatomy. CONTRAST:  17m ISOVUE-370 IOPAMIDOL (ISOVUE-370) INJECTION 76% COMPARISON:  11/09/2017 radiograph, CT chest 12/15/2010 FINDINGS: Cardiovascular: Satisfactory opacification of the pulmonary arteries to the segmental level. Acute embolus within the distal right pulmonary artery with extension of thrombus into right upper and right lower lobe segmental and subsegmental branch vessels. Small amount of thrombus within the right inter lobar artery. Acute embolus within the distal left main pulmonary artery with extension of thrombus into descending lobar and multiple segmental and subsegmental left upper and lower lobe pulmonary arteries. RV LV ratio is elevated at 1.1. Left arch with aberrancy origin right subclavian artery with retroesophageal course. Normal heart size. No pericardial effusion. Mediastinum/Nodes: Esophagus within normal limits. Heterogenous AP  window lymph node measuring up to 11 mm. Midline trachea. No thyroid mass. Lungs/Pleura: Lungs are clear. No pleural effusion or pneumothorax. Upper Abdomen: No acute abnormality. Musculoskeletal: No chest wall abnormality. No acute or significant osseous findings. Review of the MIP images confirms the above findings. IMPRESSION: 1. Positive for acute bilateral PE with CT evidence of right heart strain (RV/LV Ratio = 1.1) consistent with at least submassive (intermediate risk) PE. The presence of right heart strain has been associated with an increased risk of morbidity and mortality. Please activate Code PE by paging [phone] Critical Value/emergent results were called by telephone at the time of interpretation on 11/10/2017 at 1:01 am to Dr. PAddison Lank, who verbally acknowledged these results. Electronically Signed   By: KDonavan FoilM.D.   On: 11/10/2017 01:01    EKG: Independently reviewed.  Assessment/Plan Principal Problem:   Bilateral pulmonary embolism (HCC) Active Problems:   [place]    1. Bilateral PE - 1. Provoked 2. Heparin gtt 3. Serial trops 4. [place] the RLE source 5. 2d echo 6. Consider IVC filter tomorrow 7. Tele monitor 8. Will put in page for code PE as per protocol, but patient certainly doesn't have indications for emergent TPA at this time. 2. HIV - 1. Continue HAART  DVT prophylaxis: heparin gtt Code Status: Full Family Communication: Family at bedside Disposition Plan: Home after admit Consults called: Put in page for code PE as per protocol Admission status: Admit to inpatient - IP status for sizeable B PE with heart strain   GEtta QuillDO [facility] Pager [phone] works nights!  If 7AM-7PM, please contact the primary day team physician taking care of patient  [email or web address] Password [email or web address]  11/10/2017, 1:58 AM

## 2017-11-10 NOTE — ED Notes (Signed)
Notified CT pt is ready

## 2017-11-10 NOTE — Progress Notes (Addendum)
PROGRESS NOTE    [name]  DUK:[id number] DOB: November 12, 1975 DOA: 11/09/2017 PCP: Jonathon Jordan, MD   Brief Narrative: [name] is a 42 y.o. female with a history of HIV, recent right ankle fracture. Patient presented secondary to chest pain and found to have bilateral PE. Started on IV heparin.   Assessment & Plan:   Principal Problem:   Bilateral pulmonary embolism (HCC) Active Problems:   HIV disease ([place])   Bilateral pulmonary embolism Provoked in setting of recent right ankle surgery and immobilization. Troponin elevated but trending down. Echocardiogram significant for moderately reduced RV function. -LE venous duplex pending -Continue heparin  HIV -Continue Genvoya and Prezista  Right ankle fracture S/p ORIF   DVT prophylaxis: Heparin drip Code Status:   Code Status: Full Code Family Communication: None Disposition Plan: Discharge likely in 48 hours pending treatment with heparin and transition to oral therapy   Consultants:   Pulmonology  Procedures:   None  Antimicrobials:  Genvoya/Prezista    Subjective: Chest pain and dyspnea when ambulating, otherwise, no issues today.  Objective: Vitals:   11/10/17 0205 11/10/17 0247 11/10/17 1331 11/10/17 1340  BP: (!) 150/84 (!) 158/103 (!) 156/104 (!) 140/100  Pulse: (!) 108 (!) 105 93   Resp: (!) 24 20 (!) 24   Temp:  98.6 F (37 C) 98.4 F (36.9 C)   TempSrc:  Oral Oral   SpO2: 98% 96% 95%   Weight:  (!) 137.5 kg (303 lb 2.1 oz)    Height:  5\' 10"  (1.778 m)      Intake/Output Summary (Last 24 hours) at 11/10/2017 1357 Last data filed at 11/10/2017 0600 Gross per 24 hour  Intake 72.6 ml  Output -  Net 72.6 ml   Filed Weights   11/09/17 2136 11/10/17 0247  Weight: 131.5 kg (290 lb) (!) 137.5 kg (303 lb 2.1 oz)    Examination:  General exam: Appears calm and comfortable Respiratory system: Clear to auscultation. Respiratory effort normal. Cardiovascular system: S1 & S2  heard, RRR. No murmurs. Gastrointestinal system: Abdomen is nondistended, soft and nontender. Normal bowel sounds heard. Central nervous system: Alert and oriented. No focal neurological deficits. Extremities: Left leg edema. Skin: No cyanosis. No rashes Psychiatry: Judgement and insight appear normal. Mood & affect appropriate.     Data Reviewed: I have personally reviewed following labs and imaging studies  CBC: Recent Labs  Lab 11/09/17 2218 11/10/17 0832  WBC 10.1 10.2  HGB 14.1 13.4  HCT 41.0 40.1  MCV 89.3 89.3  PLT 265 376   Basic Metabolic Panel: Recent Labs  Lab 11/09/17 2218  NA 140  K 4.1  CL 106  CO2 26  GLUCOSE 110*  BUN 14  CREATININE 0.92  CALCIUM 9.6   GFR: Estimated Creatinine Clearance: 122.1 mL/min (by C-G formula based on SCr of 0.92 mg/dL).  Coagulation Profile: Recent Labs  Lab 11/10/17 0202  INR 0.87   Cardiac Enzymes: Recent Labs  Lab 11/10/17 0430 11/10/17 1113  TROPONINI 0.77* 0.61*   BNP (last 3 results) No results for input(s): PROBNP in the last 8760 hours. HbA1C: No results for input(s): HGBA1C in the last 72 hours. CBG: No results for input(s): GLUCAP in the last 168 hours. Lipid Profile: No results for input(s): CHOL, HDL, LDLCALC, TRIG, CHOLHDL, LDLDIRECT in the last 72 hours. Thyroid Function Tests: No results for input(s): TSH, T4TOTAL, FREET4, T3FREE, THYROIDAB in the last 72 hours. Anemia Panel: No results for input(s): VITAMINB12, FOLATE, FERRITIN, TIBC,  IRON, RETICCTPCT in the last 72 hours. Sepsis Labs: No results for input(s): PROCALCITON, LATICACIDVEN in the last 168 hours.  No results found for this or any previous visit (from the past 240 hour(s)).       Radiology Studies: Dg Chest 2 View  Result Date: 11/09/2017 CLINICAL DATA:  Sudden onset chest pain and tightness while in the shower. Internal fixation of the left ankle about a week ago. EXAM: CHEST - 2 VIEW COMPARISON:  11/14/2016 FINDINGS: The  heart size and mediastinal contours are within normal limits. Both lungs are clear. The visualized skeletal structures are unremarkable. IMPRESSION: No active cardiopulmonary disease. Electronically Signed   By: Lucienne Capers M.D.   On: 11/09/2017 22:23   Ct Angio Chest Pe W And/or Wo Contrast  Result Date: 11/10/2017 CLINICAL DATA:  Chest pain with shortness of breath EXAM: CT ANGIOGRAPHY CHEST WITH CONTRAST TECHNIQUE: Multidetector CT imaging of the chest was performed using the standard protocol during bolus administration of intravenous contrast. Multiplanar CT image reconstructions and MIPs were obtained to evaluate the vascular anatomy. CONTRAST:  140mL ISOVUE-370 IOPAMIDOL (ISOVUE-370) INJECTION 76% COMPARISON:  11/09/2017 radiograph, CT chest 12/15/2010 FINDINGS: Cardiovascular: Satisfactory opacification of the pulmonary arteries to the segmental level. Acute embolus within the distal right pulmonary artery with extension of thrombus into right upper and right lower lobe segmental and subsegmental branch vessels. Small amount of thrombus within the right inter lobar artery. Acute embolus within the distal left main pulmonary artery with extension of thrombus into descending lobar and multiple segmental and subsegmental left upper and lower lobe pulmonary arteries. RV LV ratio is elevated at 1.1. Left arch with aberrancy origin right subclavian artery with retroesophageal course. Normal heart size. No pericardial effusion. Mediastinum/Nodes: Esophagus within normal limits. Heterogenous AP window lymph node measuring up to 11 mm. Midline trachea. No thyroid mass. Lungs/Pleura: Lungs are clear. No pleural effusion or pneumothorax. Upper Abdomen: No acute abnormality. Musculoskeletal: No chest wall abnormality. No acute or significant osseous findings. Review of the MIP images confirms the above findings. IMPRESSION: 1. Positive for acute bilateral PE with CT evidence of right heart strain (RV/LV Ratio =  1.1) consistent with at least submassive (intermediate risk) PE. The presence of right heart strain has been associated with an increased risk of morbidity and mortality. Please activate Code PE by paging [phone]. Critical Value/emergent results were called by telephone at the time of interpretation on 11/10/2017 at 1:01 am to Dr. Addison Lank , who verbally acknowledged these results. Electronically Signed   By: Donavan Foil M.D.   On: 11/10/2017 01:01        Scheduled Meds: . darunavir  800 mg Oral Q breakfast  . elvitegravir-cobicistat-emtricitabine-tenofovir  1 tablet Oral Daily   Continuous Infusions: . heparin 1,800 Units/hr (11/10/17 1240)     LOS: 0 days     Cordelia Poche, MD [facility] 11/10/2017, 1:57 PM Pager: [phone]  If 7PM-7AM, please contact night-coverage [email or web address] 11/10/2017, 1:57 PM

## 2017-11-11 DIAGNOSIS — I82431 Acute embolism and thrombosis of right popliteal vein: Secondary | ICD-10-CM

## 2017-11-11 DIAGNOSIS — I82401 Acute embolism and thrombosis of unspecified deep veins of right lower extremity: Secondary | ICD-10-CM

## 2017-11-11 LAB — CBC
HEMATOCRIT: 37.8 % (ref 36.0–46.0)
HEMOGLOBIN: 12.9 g/dL (ref 12.0–15.0)
MCH: 30.3 pg (ref 26.0–34.0)
MCHC: 34.1 g/dL (ref 30.0–36.0)
MCV: 88.7 fL (ref 78.0–100.0)
Platelets: 258 10*3/uL (ref 150–400)
RBC: 4.26 MIL/uL (ref 3.87–5.11)
RDW: 13.4 % (ref 11.5–15.5)
WBC: 8.6 10*3/uL (ref 4.0–10.5)

## 2017-11-11 LAB — HEPARIN LEVEL (UNFRACTIONATED)
HEPARIN UNFRACTIONATED: 0.71 [IU]/mL — AB (ref 0.30–0.70)
HEPARIN UNFRACTIONATED: 0.59 [IU]/mL (ref 0.30–0.70)
HEPARIN UNFRACTIONATED: 0.68 [IU]/mL (ref 0.30–0.70)

## 2017-11-11 LAB — PROTIME-INR
INR: 0.96
Prothrombin Time: 12.7 seconds (ref 11.4–15.2)

## 2017-11-11 MED ORDER — WARFARIN SODIUM 5 MG PO TABS
10.0000 mg | ORAL_TABLET | Freq: Once | ORAL | Status: AC
  Administered 2017-11-11: 10 mg via ORAL
  Filled 2017-11-11: qty 2

## 2017-11-11 MED ORDER — HEPARIN (PORCINE) IN NACL 100-0.45 UNIT/ML-% IJ SOLN
1500.0000 [IU]/h | INTRAMUSCULAR | Status: DC
  Administered 2017-11-11: 1500 [IU]/h via INTRAVENOUS
  Filled 2017-11-11: qty 250

## 2017-11-11 MED ORDER — DARUNAVIR ETHANOLATE 800 MG PO TABS
800.0000 mg | ORAL_TABLET | ORAL | Status: DC
  Filled 2017-11-11: qty 1

## 2017-11-11 MED ORDER — METOPROLOL TARTRATE 5 MG/5ML IV SOLN
5.0000 mg | Freq: Once | INTRAVENOUS | Status: AC
  Administered 2017-11-11: 5 mg via INTRAVENOUS
  Filled 2017-11-11: qty 5

## 2017-11-11 NOTE — Progress Notes (Signed)
PROGRESS NOTE    [name]  YIR:[id number] DOB: Jul 07, 1975 DOA: 11/09/2017 PCP: Jonathon Jordan, MD   Brief Narrative: [name] is a 42 y.o. female with a history of HIV, recent right ankle fracture. Patient presented secondary to chest pain and found to have bilateral PE. Started on IV heparin.   Assessment & Plan:   Principal Problem:   Bilateral pulmonary embolism (HCC) Active Problems:   HIV disease ([place])   Bilateral pulmonary embolism Provoked in setting of recent right ankle surgery and immobilization. Troponin elevated but trending down. Echocardiogram significant for moderately reduced RV function. RLE venous duplex significant for DVT. -LE venous duplex pending -Continue heparin; likely transition to Lovenox in AM -Continue Coumadin per pharmacy  HIV -Continue Genvoya and Prezista  Right ankle fracture S/p ORIF   DVT prophylaxis: Heparin drip Code Status:   Code Status: Full Code Family Communication: Mother at bedside Disposition Plan: Discharge likely in 24 hours pending treatment with heparin and transition to oral therapy   Consultants:   Pulmonology  Procedures:   None  Antimicrobials:  Genvoya/Prezista    Subjective: No issues today.  Objective: Vitals:   11/10/17 2000 11/10/17 2317 11/11/17 0425 11/11/17 0809  BP: (!) 152/104 (!) 150/102 (!) 172/104 (!) 148/102  Pulse: (!) 102 88 88   Resp: 20  20   Temp: 98.3 F (36.8 C)  98.4 F (36.9 C)   TempSrc: Oral  Oral   SpO2: 97%  96%   Weight:      Height:        Intake/Output Summary (Last 24 hours) at 11/11/2017 1321 Last data filed at 11/10/2017 1800 Gross per 24 hour  Intake 460.62 ml  Output 1 ml  Net 459.62 ml   Filed Weights   11/09/17 2136 11/10/17 0247  Weight: 131.5 kg (290 lb) (!) 137.5 kg (303 lb 2.1 oz)    Examination:  General exam: Appears calm and comfortable Respiratory system: Clear to auscultation. Respiratory effort normal. Cardiovascular  system: S1 & S2 heard, RRR. No murmurs, rubs, gallops or clicks. Gastrointestinal system: Abdomen is nondistended, soft and nontender. No organomegaly or masses felt. Normal bowel sounds heard. Central nervous system: Alert and oriented. No focal neurological deficits. Skin: No cyanosis. No rashes Psychiatry: Judgement and insight appear normal. Mood & affect appropriate.     Data Reviewed: I have personally reviewed following labs and imaging studies  CBC: Recent Labs  Lab 11/09/17 2218 11/10/17 0832 11/11/17 0522  WBC 10.1 10.2 8.6  HGB 14.1 13.4 12.9  HCT 41.0 40.1 37.8  MCV 89.3 89.3 88.7  PLT 265 276 500   Basic Metabolic Panel: Recent Labs  Lab 11/09/17 2218  NA 140  K 4.1  CL 106  CO2 26  GLUCOSE 110*  BUN 14  CREATININE 0.92  CALCIUM 9.6   GFR: Estimated Creatinine Clearance: 122.1 mL/min (by C-G formula based on SCr of 0.92 mg/dL).  Coagulation Profile: Recent Labs  Lab 11/10/17 0202 11/11/17 0522  INR 0.87 0.96   Cardiac Enzymes: Recent Labs  Lab 11/10/17 0430 11/10/17 1113  TROPONINI 0.77* 0.61*   BNP (last 3 results) No results for input(s): PROBNP in the last 8760 hours. HbA1C: No results for input(s): HGBA1C in the last 72 hours. CBG: No results for input(s): GLUCAP in the last 168 hours. Lipid Profile: No results for input(s): CHOL, HDL, LDLCALC, TRIG, CHOLHDL, LDLDIRECT in the last 72 hours. Thyroid Function Tests: No results for input(s): TSH, T4TOTAL, FREET4, T3FREE, THYROIDAB  in the last 72 hours. Anemia Panel: No results for input(s): VITAMINB12, FOLATE, FERRITIN, TIBC, IRON, RETICCTPCT in the last 72 hours. Sepsis Labs: No results for input(s): PROCALCITON, LATICACIDVEN in the last 168 hours.  No results found for this or any previous visit (from the past 240 hour(s)).       Radiology Studies: Dg Chest 2 View  Result Date: 11/09/2017 CLINICAL DATA:  Sudden onset chest pain and tightness while in the shower. Internal  fixation of the left ankle about a week ago. EXAM: CHEST - 2 VIEW COMPARISON:  11/14/2016 FINDINGS: The heart size and mediastinal contours are within normal limits. Both lungs are clear. The visualized skeletal structures are unremarkable. IMPRESSION: No active cardiopulmonary disease. Electronically Signed   By: Lucienne Capers M.D.   On: 11/09/2017 22:23   Ct Angio Chest Pe W And/or Wo Contrast  Result Date: 11/10/2017 CLINICAL DATA:  Chest pain with shortness of breath EXAM: CT ANGIOGRAPHY CHEST WITH CONTRAST TECHNIQUE: Multidetector CT imaging of the chest was performed using the standard protocol during bolus administration of intravenous contrast. Multiplanar CT image reconstructions and MIPs were obtained to evaluate the vascular anatomy. CONTRAST:  172mL ISOVUE-370 IOPAMIDOL (ISOVUE-370) INJECTION 76% COMPARISON:  11/09/2017 radiograph, CT chest 12/15/2010 FINDINGS: Cardiovascular: Satisfactory opacification of the pulmonary arteries to the segmental level. Acute embolus within the distal right pulmonary artery with extension of thrombus into right upper and right lower lobe segmental and subsegmental branch vessels. Small amount of thrombus within the right inter lobar artery. Acute embolus within the distal left main pulmonary artery with extension of thrombus into descending lobar and multiple segmental and subsegmental left upper and lower lobe pulmonary arteries. RV LV ratio is elevated at 1.1. Left arch with aberrancy origin right subclavian artery with retroesophageal course. Normal heart size. No pericardial effusion. Mediastinum/Nodes: Esophagus within normal limits. Heterogenous AP window lymph node measuring up to 11 mm. Midline trachea. No thyroid mass. Lungs/Pleura: Lungs are clear. No pleural effusion or pneumothorax. Upper Abdomen: No acute abnormality. Musculoskeletal: No chest wall abnormality. No acute or significant osseous findings. Review of the MIP images confirms the above  findings. IMPRESSION: 1. Positive for acute bilateral PE with CT evidence of right heart strain (RV/LV Ratio = 1.1) consistent with at least submassive (intermediate risk) PE. The presence of right heart strain has been associated with an increased risk of morbidity and mortality. Please activate Code PE by paging [phone]. Critical Value/emergent results were called by telephone at the time of interpretation on 11/10/2017 at 1:01 am to Dr. Addison Lank , who verbally acknowledged these results. Electronically Signed   By: Donavan Foil M.D.   On: 11/10/2017 01:01        Scheduled Meds: . darunavir  800 mg Oral Q breakfast  . elvitegravir-cobicistat-emtricitabine-tenofovir  1 tablet Oral Daily  . Warfarin - Pharmacist Dosing Inpatient   Does not apply q1800   Continuous Infusions: . heparin 1,500 Units/hr (11/11/17 8309)     LOS: 1 day     Cordelia Poche, MD [facility] 11/11/2017, 1:21 PM Pager: [phone]  If 7PM-7AM, please contact night-coverage [email or web address] 11/11/2017, 1:21 PM

## 2017-11-11 NOTE — Progress Notes (Addendum)
Pharmacy - Brief Note  - see previous note from 7/27 for more detailed info  Pharmacy Consult for IV heparin Indication: pulmonary embolus  0530heparin level, SUPRAtherapeutic at 0.71 on heparin at 1600 units/hr - Heparin infusing at correct rate per RN  Plan  Reduce heparin gtt to 1500 units/hr  Re-check heparin level in 6h  Daily heparin level, CBC, INR  Dorrene German 11/11/2017 6:06 AM

## 2017-11-11 NOTE — Progress Notes (Signed)
Pharmacy: Re- heparin  Patient's a 42 y.o F currently on heparin drip for acute PE.  Confirmatory heparin level now back therapeutic at at 0.68 (goal 0.3-0.7) with rate running at 1500 units/hr.  Plan: - continue heparin drip at 1500 unit/hr - f/u with AM labs and adjust rate if needed  - monitor for s/s bleeding  Dia Sitter, PharmD, BCPS 11/11/2017 7:55 PM

## 2017-11-11 NOTE — Plan of Care (Signed)
VSS, medicated for pain x 2 this shift with improvement.  Patient states overall she is feeling better, dyspnea improved.  Multiple family members in room during shift.

## 2017-11-11 NOTE — Discharge Instructions (Addendum)
Information on my medicine - Coumadin   (Warfarin)  This medication education was reviewed with me or my healthcare representative as part of my discharge preparation.  The pharmacist that spoke with me during my hospital stay was:  Henreitta Leber. PharmD  Why was Coumadin prescribed for you? Coumadin was prescribed for you because you have a blood clot or a medical condition that can cause an increased risk of forming blood clots. Blood clots can cause serious health problems by blocking the flow of blood to the heart, lung, or brain. Coumadin can prevent harmful blood clots from forming. As a reminder your indication for Coumadin is:   Pulmonary Embolism Treatment   What test will check on my response to Coumadin? While on Coumadin (warfarin) you will need to have an INR test regularly to ensure that your dose is keeping you in the desired range. The INR (international normalized ratio) number is calculated from the result of the laboratory test called prothrombin time (PT).  If an INR APPOINTMENT HAS NOT ALREADY BEEN MADE FOR YOU please schedule an appointment to have this lab work done by your health care provider within 7 days. Your INR goal is usually a number between:  2 to 3 or your provider may give you a more narrow range like 2-2.5.  Ask your health care provider during an office visit what your goal INR is.  What  do you need to  know  About  COUMADIN? Take Coumadin (warfarin) exactly as prescribed by your healthcare provider about the same time each day.  DO NOT stop taking without talking to the doctor who prescribed the medication.  Stopping without other blood clot prevention medication to take the place of Coumadin may increase your risk of developing a new clot or stroke.  Get refills before you run out.  What do you do if you miss a dose? If you miss a dose, take it as soon as you remember on the same day then continue your regularly scheduled regimen the next day.  Do not take two  doses of Coumadin at the same time.  Important Safety Information A possible side effect of Coumadin (Warfarin) is an increased risk of bleeding. You should call your healthcare provider right away if you experience any of the following: ? Bleeding from an injury or your nose that does not stop. ? Unusual colored urine (red or dark brown) or unusual colored stools (red or black). ? Unusual bruising for unknown reasons. ? A serious fall or if you hit your head (even if there is no bleeding).  Some foods or medicines interact with Coumadin (warfarin) and might alter your response to warfarin. To help avoid this: ? Eat a balanced diet, maintaining a consistent amount of Vitamin K. ? Notify your provider about major diet changes you plan to make. ? Avoid alcohol or limit your intake to 1 drink for women and 2 drinks for men per day. (1 drink is 5 oz. wine, 12 oz. beer, or 1.5 oz. liquor.)  Make sure that ANY health care provider who prescribes medication for you knows that you are taking Coumadin (warfarin).  Also make sure the healthcare provider who is monitoring your Coumadin knows when you have started a new medication including herbals and non-prescription products.  Coumadin (Warfarin)  Major Drug Interactions  Increased Warfarin Effect Decreased Warfarin Effect  Alcohol (large quantities) Antibiotics (esp. Septra/Bactrim, Flagyl, Cipro) Amiodarone (Cordarone) Aspirin (ASA) Cimetidine (Tagamet) Megestrol (Megace) NSAIDs (ibuprofen, naproxen, etc.) Piroxicam (  Feldene) °Propafenone (Rythmol SR) °Propranolol (Inderal) °Isoniazid (INH) °Posaconazole (Noxafil) Barbiturates (Phenobarbital) °Carbamazepine (Tegretol) °Chlordiazepoxide (Librium) °Cholestyramine (Questran) °Griseofulvin °Oral Contraceptives °Rifampin °Sucralfate (Carafate) °Vitamin K  ° °Coumadin® (Warfarin) Major Herbal Interactions  °Increased Warfarin Effect Decreased Warfarin Effect  °Garlic °Ginseng °Ginkgo biloba Coenzyme  Q10 °Green tea °St. John’s wort   ° °Coumadin® (Warfarin) FOOD Interactions  °Eat a consistent number of servings per week of foods HIGH in Vitamin K °(1 serving = ½ cup)  °Collards (cooked, or boiled & drained) °Kale (cooked, or boiled & drained) °Mustard greens (cooked, or boiled & drained) °Parsley *serving size only = ¼ cup °Spinach (cooked, or boiled & drained) °Swiss chard (cooked, or boiled & drained) °Turnip greens (cooked, or boiled & drained)  °Eat a consistent number of servings per week of foods MEDIUM-HIGH in Vitamin K °(1 serving = 1 cup)  °Asparagus (cooked, or boiled & drained) °Broccoli (cooked, boiled & drained, or raw & chopped) °Brussel sprouts (cooked, or boiled & drained) *serving size only = ½ cup °Lettuce, raw (green leaf, endive, romaine) °Spinach, raw °Turnip greens, raw & chopped  ° °These websites have more information on Coumadin (warfarin):  www.coumadin.com; °www.ahrq.gov/consumer/coumadin.htm; ° ° ° °

## 2017-11-11 NOTE — Progress Notes (Signed)
Name: [name] MRN: [id number] DOB: 1975-11-08    ADMISSION DATE:  11/09/2017 CONSULTATION DATE: 11/10/2017  REFERRING MD : Cordelia Poche  CHIEF COMPLAINT: Shortness of breath and chest discomfort  BRIEF PATIENT DESCRIPTION:  Patient had ankle surgery and 17 July, ORIF Developed chest pain and associated shortness of breath leading to presentation to the hospital Recent ankle surgery and immobilization likely predisposed to her thromboembolic event  SIGNIFICANT EVENTS  Has remained hemodynamically stable Continues on heparin at present Goal is to transition to Coumadin  STUDIES:  Echo-with right ventricular strain CT chest-suggested right ventricular strain   HISTORY OF PRESENT ILLNESS:   Presented to the hospital with shortness of breath, chest discomfort Recent ORIF July 17 Right lower extremity swelling On antiretrovirals  PAST MEDICAL HISTORY :   has a past medical history of Anxiety, Candidal esophagitis ([place]), Complication of anesthesia (07/2011), Family history of adverse reaction to anesthesia, Fracture of right ankle, lateral malleolus, HIV (human immunodeficiency virus infection) ([place]), MAC (mycobacterium avium-intracellulare complex), and Pulmonary embolism ([place]).  has a past surgical history that includes Tonsillectomy; Anterior cruciate ligament repair; Nasal reconstruction (6010); Supraclavical node biopsy (07/28/2011); Lymph node biopsy (Left, 11/14/2016); ORIF ankle fracture (Right, 11/01/2017); and Ankle arthroscopy with open reduction internal fixation (orif) (Right, 10/31/2017). Prior to Admission medications   Medication Sig Start Date End Date Taking? Authorizing Provider  GENVOYA 150-150-200-10 MG TABS tablet TAKE 1 TABLET BY MOUTH DAILY 06/25/17  Yes Comer, Okey Regal, MD  PREZISTA 800 MG tablet TAKE 1 TABLET(800 MG) BY MOUTH DAILY 06/25/17  Yes Comer, Okey Regal, MD  oxyCODONE (ROXICODONE) 5 MG immediate release tablet Take 1 tablet (5 mg total) by mouth  every 6 (six) hours as needed for severe pain. Patient not taking: Reported on 11/10/2017 11/01/17   Wylene Simmer, MD   No Known Allergies  FAMILY HISTORY:  family history is not on file. SOCIAL HISTORY:  reports that she has never smoked. She has never used smokeless tobacco. She reports that she drinks alcohol. She reports that she does not use drugs.  REVIEW OF SYSTEMS:   Constitutional: Negative for fever, chills, weight loss, malaise/fatigue and diaphoresis.  HENT: Negative for hearing loss, ear pain, nosebleeds, congestion, sore throat, neck pain, tinnitus and ear discharge.   Eyes: Negative for blurred vision, double vision, photophobia, pain, discharge and redness.  Respiratory: No shortness of breath at rest, shortness of breath with activity  cardiovascular: Negative for chest pain or discomfort at present Gastrointestinal: No pain or discomfort  Genitourinary: Negative for dysuria, urgency, frequency, hematuria and flank pain.  Musculoskeletal: Negative for myalgias, back pain, joint pain and falls.  Skin: Negative for itching and rash.  Neurological: Negative for dizziness, tingling, tremors, sensory change, speech change, focal weakness, seizures, loss of consciousness, weakness and headaches.  Endo/Heme/Allergies: Negative for environmental allergies and polydipsia. Does not bruise/bleed easily.  SUBJECTIVE:   VITAL SIGNS: Temp:  [98.3 F (36.8 C)-98.4 F (36.9 C)] 98.4 F (36.9 C) (07/28 0425) Pulse Rate:  [88-102] 88 (07/28 0425) Resp:  [20-24] 20 (07/28 0425) BP: (138-172)/(100-104) 148/102 (07/28 0809) SpO2:  [95 %-97 %] 96 % (07/28 0425)  PHYSICAL EXAMINATION: General: Awake and alert, does not appear to be in distress Neuro: Alert and oriented, moving all extremities HEENT: No oral lesions, no nasal lesions Cardiovascular: S1-S2 appreciated with no murmurs Lungs: Clear breath sounds bilaterally Abdomen: Bowel sounds appreciated, no  organomegaly Musculoskeletal: No deformity, she does have a cast right leg Skin: Warm and dry  Recent Labs  Lab 11/09/17 2218  NA 140  K 4.1  CL 106  CO2 26  BUN 14  CREATININE 0.92  GLUCOSE 110*   Recent Labs  Lab 11/09/17 2218 11/10/17 0832 11/11/17 0522  HGB 14.1 13.4 12.9  HCT 41.0 40.1 37.8  WBC 10.1 10.2 8.6  PLT 265 276 258   Dg Chest 2 View  Result Date: 11/09/2017 CLINICAL DATA:  Sudden onset chest pain and tightness while in the shower. Internal fixation of the left ankle about a week ago. EXAM: CHEST - 2 VIEW COMPARISON:  11/14/2016 FINDINGS: The heart size and mediastinal contours are within normal limits. Both lungs are clear. The visualized skeletal structures are unremarkable. IMPRESSION: No active cardiopulmonary disease. Electronically Signed   By: Lucienne Capers M.D.   On: 11/09/2017 22:23   Ct Angio Chest Pe W And/or Wo Contrast  Result Date: 11/10/2017 CLINICAL DATA:  Chest pain with shortness of breath EXAM: CT ANGIOGRAPHY CHEST WITH CONTRAST TECHNIQUE: Multidetector CT imaging of the chest was performed using the standard protocol during bolus administration of intravenous contrast. Multiplanar CT image reconstructions and MIPs were obtained to evaluate the vascular anatomy. CONTRAST:  167m ISOVUE-370 IOPAMIDOL (ISOVUE-370) INJECTION 76% COMPARISON:  11/09/2017 radiograph, CT chest 12/15/2010 FINDINGS: Cardiovascular: Satisfactory opacification of the pulmonary arteries to the segmental level. Acute embolus within the distal right pulmonary artery with extension of thrombus into right upper and right lower lobe segmental and subsegmental branch vessels. Small amount of thrombus within the right inter lobar artery. Acute embolus within the distal left main pulmonary artery with extension of thrombus into descending lobar and multiple segmental and subsegmental left upper and lower lobe pulmonary arteries. RV LV ratio is elevated at 1.1. Left arch with aberrancy  origin right subclavian artery with retroesophageal course. Normal heart size. No pericardial effusion. Mediastinum/Nodes: Esophagus within normal limits. Heterogenous AP window lymph node measuring up to 11 mm. Midline trachea. No thyroid mass. Lungs/Pleura: Lungs are clear. No pleural effusion or pneumothorax. Upper Abdomen: No acute abnormality. Musculoskeletal: No chest wall abnormality. No acute or significant osseous findings. Review of the MIP images confirms the above findings. IMPRESSION: 1. Positive for acute bilateral PE with CT evidence of right heart strain (RV/LV Ratio = 1.1) consistent with at least submassive (intermediate risk) PE. The presence of right heart strain has been associated with an increased risk of morbidity and mortality. Please activate Code PE by paging [phone] Critical Value/emergent results were called by telephone at the time of interpretation on 11/10/2017 at 1:01 am to Dr. PAddison Lank, who verbally acknowledged these results. Electronically Signed   By: KDonavan FoilM.D.   On: 11/10/2017 01:01    ASSESSMENT / PLAN:  .  Pulmonary embolism .  Submassive PE-has remained hemodynamically stable .  Shortness of breath-currently on room air with no shortness of breath at rest .  Right ventricular strain .  DVT noted on ultrasound .  HIV status  Will recommend to continue anti-Coblation at the present time, currently on heparin She will be started on Lovenox to transition to Coumadin  Recommend 3 months of anticoagulation for provoked PE  Pulmonary and CBitter SpringsPager: [phone] 11/11/2017, 12:26 PM

## 2017-11-11 NOTE — Progress Notes (Signed)
ANTICOAGULATION CONSULT NOTE - Follow-up Consult  Pharmacy Consult for IV heparin/warfarin Indication: pulmonary embolus  No Known Allergies  Patient Measurements: Height: 5\' 10"  (177.8 cm) Weight: (!) 303 lb 2.1 oz (137.5 kg) IBW/kg (Calculated) : 68.5 Heparin Dosing Weight: 88 kg  Vital Signs: Temp: 98.5 F (36.9 C) (07/28 1411) Temp Source: Oral (07/28 1411) BP: 148/88 (07/28 1411) Pulse Rate: 89 (07/28 1411)  Labs: Recent Labs    11/09/17 2218 11/10/17 0202 11/10/17 0430 11/10/17 0832 11/10/17 1113  11/10/17 2205 11/11/17 0522 11/11/17 1321  HGB 14.1  --   --  13.4  --   --   --  12.9  --   HCT 41.0  --   --  40.1  --   --   --  37.8  --   PLT 265  --   --  276  --   --   --  258  --   APTT  --  29  --   --   --   --   --   --   --   LABPROT  --  11.8  --   --   --   --   --  12.7  --   INR  --  0.87  --   --   --   --   --  0.96  --   HEPARINUNFRC  --   --   --  0.66  --    < > 0.76* 0.71* 0.59  CREATININE 0.92  --   --   --   --   --   --   --   --   TROPONINI  --   --  0.77*  --  0.61*  --   --   --   --    < > = values in this interval not displayed.    Estimated Creatinine Clearance: 122.1 mL/min (by C-G formula based on SCr of 0.92 mg/dL).   Assessment: 77 yoF c/o sudden onset of chest tightness and SOB in setting of recent ORIF of the right ankle.  CT confirming saddle embolism with evidence of right heart strain.  IV heparin for PE.   Today, 11/11/2017  Heparin level recheck this afternoon is therapeutic (= 0.59) following 3 rate reductions.  Currently on heparin 1500 units/hr  INR = 0.96 following 10mg  dose last evening  On warfarin due to drug interactions with ART and DOACs  CBC: Hgb decreased (+ menstruation) and pltc WNL  Trop elevated - likely from heart strain  ECHO: EF 81-19%, RV systolic fx moderately reduced  Patient on ibuprofen for menstrual cramps   Patient education provided 7/28 - discussion included -  lab monitoring,  watching for unusual bleeding/bruising, dosing, drug interactions, limiting or avoiding alcohol intake, etc.   Goal of Therapy:  Heparin level 0.3-0.7 units/ml Monitor platelets by anticoagulation protocol: Yes   Plan:  Day #2 of minimum 5-day overlap for acute VTE  Continue heparin at 1500 units/hr  Note say plan to change to enoxaparin 7/29  Check confirmatory heparin level this evening  Warfarin 10mg  PO x1   Increase dose 7/29 if rate of INR rise less than expected  Daily INR,  heparin level and CBC  Monitor for bleeding complications    Doreene Eland, PharmD, BCPS.   Pager: [phone] 11/11/2017 2:30 PM

## 2017-11-12 LAB — PROTIME-INR
INR: 1.02
Prothrombin Time: 13.3 seconds (ref 11.4–15.2)

## 2017-11-12 LAB — CBC
HEMATOCRIT: 36.5 % (ref 36.0–46.0)
Hemoglobin: 12.4 g/dL (ref 12.0–15.0)
MCH: 30.2 pg (ref 26.0–34.0)
MCHC: 34 g/dL (ref 30.0–36.0)
MCV: 89 fL (ref 78.0–100.0)
Platelets: 269 10*3/uL (ref 150–400)
RBC: 4.1 MIL/uL (ref 3.87–5.11)
RDW: 13.5 % (ref 11.5–15.5)
WBC: 8.4 10*3/uL (ref 4.0–10.5)

## 2017-11-12 LAB — HEPARIN LEVEL (UNFRACTIONATED): HEPARIN UNFRACTIONATED: 0.61 [IU]/mL (ref 0.30–0.70)

## 2017-11-12 MED ORDER — HYDROCHLOROTHIAZIDE 12.5 MG PO CAPS
12.5000 mg | ORAL_CAPSULE | Freq: Every day | ORAL | Status: DC
  Administered 2017-11-12: 12.5 mg via ORAL
  Filled 2017-11-12: qty 1

## 2017-11-12 MED ORDER — ENOXAPARIN SODIUM 150 MG/ML ~~LOC~~ SOLN
140.0000 mg | Freq: Two times a day (BID) | SUBCUTANEOUS | Status: DC
  Administered 2017-11-12: 140 mg via SUBCUTANEOUS
  Filled 2017-11-12 (×2): qty 0.93

## 2017-11-12 MED ORDER — WARFARIN SODIUM 5 MG PO TABS
15.0000 mg | ORAL_TABLET | Freq: Once | ORAL | Status: AC
  Administered 2017-11-12: 15 mg via ORAL
  Filled 2017-11-12: qty 3

## 2017-11-12 MED ORDER — HYDROCHLOROTHIAZIDE 12.5 MG PO CAPS: 12.5000 mg | ORAL_CAPSULE | Freq: Every day | ORAL | 0 refills | Status: AC

## 2017-11-12 MED ORDER — ENOXAPARIN (LOVENOX) PATIENT EDUCATION KIT
PACK | Freq: Once | Status: AC
  Administered 2017-11-12: 15:00:00
  Filled 2017-11-12: qty 1

## 2017-11-12 MED ORDER — ENOXAPARIN SODIUM 150 MG/ML ~~LOC~~ SOLN: 150.0000 mg | Freq: Two times a day (BID) | SUBCUTANEOUS | 0 refills | Status: DC

## 2017-11-12 MED ORDER — WARFARIN SODIUM 5 MG PO TABS: 10.0000 mg | ORAL_TABLET | Freq: Once | ORAL | 0 refills | Status: DC

## 2017-11-12 NOTE — Progress Notes (Signed)
Per [facility] f/u on meds/co pay through her health insurance-Patient's medication cost are under her Worker's Comp through [employer] adjuster-patient has been informed to go to her local pharmacy for her meds-patient voiced understanding. Patient will call [facility] physicians for hospital f/u & pt/inr appt. No further CM needs.

## 2017-11-12 NOTE — Progress Notes (Signed)
ANTICOAGULATION CONSULT NOTE - Follow-up Consult  Pharmacy Consult for IV heparin/warfarin Indication: pulmonary embolus  No Known Allergies  Patient Measurements: Height: 5\' 10"  (177.8 cm) Weight: (!) 303 lb 2.1 oz (137.5 kg) IBW/kg (Calculated) : 68.5 Heparin Dosing Weight: 88 kg  Vital Signs: Temp: 97.5 F (36.4 C) (07/29 0609) Temp Source: Oral (07/29 0609) BP: 162/94 (07/29 0609) Pulse Rate: 96 (07/29 0609)  Labs: Recent Labs    11/09/17 2218 11/10/17 0202 11/10/17 0430 11/10/17 0832 11/10/17 1113  11/11/17 0522 11/11/17 1321 11/11/17 1909 11/12/17 0415  HGB 14.1  --   --  13.4  --   --  12.9  --   --  12.4  HCT 41.0  --   --  40.1  --   --  37.8  --   --  36.5  PLT 265  --   --  276  --   --  258  --   --  269  APTT  --  29  --   --   --   --   --   --   --   --   LABPROT  --  11.8  --   --   --   --  12.7  --   --  13.3  INR  --  0.87  --   --   --   --  0.96  --   --  1.02  HEPARINUNFRC  --   --   --  0.66  --    < > 0.71* 0.59 0.68 0.61  CREATININE 0.92  --   --   --   --   --   --   --   --   --   TROPONINI  --   --  0.77*  --  0.61*  --   --   --   --   --    < > = values in this interval not displayed.    Estimated Creatinine Clearance: 122.1 mL/min (by C-G formula based on SCr of 0.92 mg/dL).   Assessment: 54 yoF c/o sudden onset of chest tightness and SOB in setting of recent ORIF of the right ankle.  CT confirming saddle embolism with evidence of right heart strain.  IV heparin for PE.   Today, 11/12/2017  Heparin level remains therapeutic (= 0.61) on heparin 1500 units/hr  INR = 1.02 following 10mg  dose x2  On warfarin due to drug interactions with ART and DOACs  CBC: Hgb decreased (+ menstruation) and pltc WNL  Trop elevated - likely from heart strain  ECHO: EF 15-40%, RV systolic fx moderately reduced  Patient on ibuprofen for menstrual cramps   Goal of Therapy:  Heparin level 0.3-0.7 units/ml Monitor platelets by anticoagulation  protocol: Yes  INR 2-3   Plan:  Day #3 of minimum 5-day overlap for acute VTE  Continue heparin at 1500 units/hr  Warfarin 15mg  PO x1   Daily INR,  heparin level and CBC  Monitor for bleeding complications  Possible discharge today if pt can afford Lovenox-->Recommend Enoxaparin 140mg  SQ q12h until INR>2 x24h + Warfarin 10mg  daily with close INR monitoring.    Netta Cedars, PharmD, BCPS Pager: [phone] 11/12/2017 7:22 AM

## 2017-11-12 NOTE — Progress Notes (Signed)
Pt discharged from the unit via wheelchair. Discharge instructions reviewed with pt and family members at discharge. Education given on Lovenox injections prior to discharge. Coumadin and Lovenox given. No questions or concerns at this time.

## 2017-11-12 NOTE — Progress Notes (Signed)
Subjective:   The patient is now about 10 days out from ORIF of right ankle fracture as an outpatient.  She is admitted for PE.  She denies any pain in the right ankle. Patient reports pain as mild.    Objective: Vital signs in last 24 hours: Temp:  [97.5 F (36.4 C)-98.5 F (36.9 C)] 97.5 F (36.4 C) (07/29 0609) Pulse Rate:  [89-97] 94 (07/29 1157) Resp:  [18-26] 18 (07/29 0609) BP: (140-162)/(81-100) 140/81 (07/29 1157) SpO2:  [99 %-100 %] 100 % (07/29 1107)  Intake/Output from previous day: 07/28 0701 - 07/29 0700 In: 296.5 [I.V.:296.5] Out: -  Intake/Output this shift: Total I/O In: 240 [P.O.:240] Out: -   Recent Labs    11/09/17 2218 11/10/17 0832 11/11/17 0522 11/12/17 0415  HGB 14.1 13.4 12.9 12.4   Recent Labs    11/11/17 0522 11/12/17 0415  WBC 8.6 8.4  RBC 4.26 4.10  HCT 37.8 36.5  PLT 258 269   Recent Labs    11/09/17 2218  NA 140  K 4.1  CL 106  CO2 26  BUN 14  CREATININE 0.92  GLUCOSE 110*  CALCIUM 9.6   Recent Labs    11/11/17 0522 11/12/17 0415  INR 0.96 1.02    PE:  wn wd woman in nad.  R ankle immobilized in a splint.    Assessment/Plan:    I'll plan to see her in f/u in the office Thursday.  Continue NWB with crutches until then.  She and her mother understand the plan and agree.    Wylene Simmer 11/12/2017, 12:50 PM

## 2017-11-12 NOTE — Care Management Note (Signed)
TC pcp office-[facility] spoke to [facility] office performs pt/inr checks with appt;also hospital f/u-patient can call or CM can set up if date is know-MD updated. Will also f/u on benefit check for lovenox 140mg  sq bid x7days-await patient's responsibility of co-pay.

## 2017-11-12 NOTE — Progress Notes (Signed)
SATURATION QUALIFICATIONS: (This note is used to comply with regulatory documentation for home oxygen)  Patient Saturations on Room Air at Rest = 100%  Patient Saturations on Room Air while Ambulating = 99%  Patient Saturations on  Liters of oxygen while Ambulating = %  Please briefly explain why patient needs home oxygen:

## 2017-11-12 NOTE — Discharge Summary (Signed)
Physician Discharge Summary  [name] DOB: 08/30/1975 DOA: 11/09/2017  PCP: Jonathon Jordan, MD  Admit date: 11/09/2017 Discharge date: 11/12/2017  Admitted From: Home Disposition: Home  Recommendations for Outpatient Follow-up:  1. Follow up with PCP this week 2. Coumadin dosing/INR checks. Needs Lovenox bridge until therapeutic 3. Recommend 3 months of anticoagulation for provoked DVT/PE 4. Please follow up on the following pending results: None  Home Health: None Equipment/Devices: None  Discharge Condition: Stable CODE STATUS: Full code Diet recommendation: Heart healthy   Brief/Interim Summary:  Admission HPI written by Etta Quill, DO   Chief Complaint: CP  HPI: [name] is a 42 y.o. female with medical history significant of HIV on HAART, just had ORIF of R ankle done by Dr. Jhonnie Garner ~1 week ago.  Still in cast.  Patient presents to the ED with c/o 3 hour onset of substernal CP.  Tightness quality.  Associated with RLE pain, swelling, and SOB.   ED Course: Significant RLE swelling noted.  Trop of 0.22.  Tachy to 110s.  BP 240 systolic.  CTA confirms bilateral PEs and heart strain.   Hospital course:  Bilateral pulmonary embolism Right LE DVT Provoked in setting of recent right ankle surgery and immobilization. Troponin elevated initially in setting of heart strain but trended down. Echocardiogram significant for moderately reduced RV function. RLE venous duplex significant for DVT. Pulmonology consulted and will follow-up as an outpatient. Patient treated with heparin drip initially with initiation of Coumadin and transition to Lovenox to continue bridge. Patient ambulated with pulse oximeter without qualification for home oxygen.  HIV Continued Genvoya and Prezista  Right ankle fracture S/p ORIF. Non weight bearing of right lower extremity  Hypertensive urgency Patient started on hydrochlorothiazide. Outpatient  follow-up.    Discharge Diagnoses:  Principal Problem:   Bilateral pulmonary embolism (HCC) Active Problems:   HIV disease ([place])   Right leg DVT [facility])    Discharge Instructions  Discharge Instructions    Call MD for:  extreme fatigue   Complete by:  As directed    Call MD for:  severe uncontrolled pain   Complete by:  As directed    Call MD for:  temperature >100.4   Complete by:  As directed    Increase activity slowly   Complete by:  As directed      Allergies as of 11/12/2017   No Known Allergies     Medication List    TAKE these medications   enoxaparin 150 MG/ML injection Commonly known as:  LOVENOX Inject 1 mL (150 mg total) into the skin every 12 (twelve) hours.   GENVOYA 150-150-200-10 MG Tabs tablet Generic drug:  elvitegravir-cobicistat-emtricitabine-tenofovir TAKE 1 TABLET BY MOUTH DAILY   hydrochlorothiazide 12.5 MG capsule Commonly known as:  MICROZIDE Take 1 capsule (12.5 mg total) by mouth daily. Start taking on:  11/13/2017   oxyCODONE 5 MG immediate release tablet Commonly known as:  ROXICODONE Take 1 tablet (5 mg total) by mouth every 6 (six) hours as needed for severe pain.   PREZISTA 800 MG tablet Generic drug:  darunavir TAKE 1 TABLET(800 MG) BY MOUTH DAILY   warfarin 5 MG tablet Commonly known as:  COUMADIN Take 2 tablets (10 mg total) by mouth one time only at 6 PM. F.u with PCP for continued Coumadin dosing and INR monitoring. Start taking on:  11/13/2017      Follow-up Information    [place], [place]. Schedule an appointment  as soon as possible for a visit.   Specialty:  Family Medicine Why:  hospital f/u appt;pt/inr Contact information: [place] [address] [phone]          No Known Allergies  Consultations:  Pulmonology   Procedures/Studies: Dg Chest 2 View  Result Date: 11/09/2017 CLINICAL DATA:  Sudden onset chest pain and tightness while in the  shower. Internal fixation of the left ankle about a week ago. EXAM: CHEST - 2 VIEW COMPARISON:  11/14/2016 FINDINGS: The heart size and mediastinal contours are within normal limits. Both lungs are clear. The visualized skeletal structures are unremarkable. IMPRESSION: No active cardiopulmonary disease. Electronically Signed   By: Lucienne Capers M.D.   On: 11/09/2017 22:23   Dg Ankle Complete Right  Result Date: 10/24/2017 CLINICAL DATA:  Misstepped going down stairs today and fell rolling the right ankle and twisting the right knee. Persistent lateral ankle pain. EXAM: RIGHT ANKLE - COMPLETE 3+ VIEW COMPARISON:  None in PACs FINDINGS: The patient has sustained a minimally displaced fracture of the distal fibular metaphysis. The ankle joint mortise is preserved. The talar dome is intact. The medial and posterior malleoli are intact. The calcaneus exhibits no acute abnormality. There is an unfused apophysis of the base of the fifth meta tarsal. There is soft tissue swelling anteriorly and laterally. IMPRESSION: There is an acute mildly displaced fracture of the distal right fibular metaphysis. No acute fracture is observed elsewhere in the ankle. Electronically Signed   By: David  Martinique M.D.   On: 10/24/2017 13:21   Ct Angio Chest Pe W And/or Wo Contrast  Result Date: 11/10/2017 CLINICAL DATA:  Chest pain with shortness of breath EXAM: CT ANGIOGRAPHY CHEST WITH CONTRAST TECHNIQUE: Multidetector CT imaging of the chest was performed using the standard protocol during bolus administration of intravenous contrast. Multiplanar CT image reconstructions and MIPs were obtained to evaluate the vascular anatomy. CONTRAST:  126mL ISOVUE-370 IOPAMIDOL (ISOVUE-370) INJECTION 76% COMPARISON:  11/09/2017 radiograph, CT chest 12/15/2010 FINDINGS: Cardiovascular: Satisfactory opacification of the pulmonary arteries to the segmental level. Acute embolus within the distal right pulmonary artery with extension of thrombus  into right upper and right lower lobe segmental and subsegmental branch vessels. Small amount of thrombus within the right inter lobar artery. Acute embolus within the distal left main pulmonary artery with extension of thrombus into descending lobar and multiple segmental and subsegmental left upper and lower lobe pulmonary arteries. RV LV ratio is elevated at 1.1. Left arch with aberrancy origin right subclavian artery with retroesophageal course. Normal heart size. No pericardial effusion. Mediastinum/Nodes: Esophagus within normal limits. Heterogenous AP window lymph node measuring up to 11 mm. Midline trachea. No thyroid mass. Lungs/Pleura: Lungs are clear. No pleural effusion or pneumothorax. Upper Abdomen: No acute abnormality. Musculoskeletal: No chest wall abnormality. No acute or significant osseous findings. Review of the MIP images confirms the above findings. IMPRESSION: 1. Positive for acute bilateral PE with CT evidence of right heart strain (RV/LV Ratio = 1.1) consistent with at least submassive (intermediate risk) PE. The presence of right heart strain has been associated with an increased risk of morbidity and mortality. Please activate Code PE by paging [phone]. Critical Value/emergent results were called by telephone at the time of interpretation on 11/10/2017 at 1:01 am to Dr. Addison Lank , who verbally acknowledged these results. Electronically Signed   By: Donavan Foil M.D.   On: 11/10/2017 01:01   Dg Knee Complete 4 Views Right  Result  Date: 10/24/2017 CLINICAL DATA:  Missed step today and fell injuring the right ankle and twisting the right knee. EXAM: RIGHT KNEE - COMPLETE 4+ VIEW COMPARISON:  None in PACs FINDINGS: The bones are subjectively adequately mineralized. There is mild loss of the lateral joint space. There is mild beaking of the medial tibial spine. Spurs arise from the lateral articular margin of the lateral femoral condyle and lateral tibial plateau. There is no  tibial plateau fracture. Spurs arise from the articular margins of the patella. There is a small suprapatellar effusion. There is no acute fracture or dislocation. IMPRESSION: Moderate degenerative change of the lateral joint compartment. Milder degenerative changes of the patellofemoral compartment. No acute bony abnormality. Electronically Signed   By: David  Martinique M.D.   On: 10/24/2017 13:22   7/27: Transthoracic Echocardiogram  Study Conclusions  - Left ventricle: The cavity size was normal. There was mild focal   basal hypertrophy of the septum. Systolic function was normal.   The estimated ejection fraction was in the range of 55% to 60%.   Wall motion was normal; there were no regional wall motion   abnormalities. Left ventricular diastolic function parameters   were normal. - Aortic valve: Trileaflet; mildly thickened, mildly calcified   leaflets. - Mitral valve: Calcified annulus. - Right ventricle: Systolic function was moderately reduced. - Inferior vena cava: The vessel was mildly dilated.   Subjective: Some dyspnea on exertion.  Discharge Exam: Vitals:   11/12/17 1107 11/12/17 1157  BP:  140/81  Pulse:  94  Resp:    Temp:    SpO2: 100%    Vitals:   11/11/17 2150 11/12/17 0609 11/12/17 1107 11/12/17 1157  BP: (!) 157/100 (!) 162/94  140/81  Pulse: 97 96  94  Resp: 18 18    Temp: 97.9 F (36.6 C) (!) 97.5 F (36.4 C)    TempSrc: Oral Oral    SpO2: 99% 99% 100%   Weight:      Height:        General: Pt is alert, awake, not in acute distress Cardiovascular: RRR, S1/S2 +, no rubs, no gallops Respiratory: CTA bilaterally, no wheezing, no rhonchi Abdominal: Soft, NT, ND, bowel sounds + Extremities: RLE edema, no cyanosis    The results of significant diagnostics from this hospitalization (including imaging, microbiology, ancillary and laboratory) are listed below for reference.     Microbiology: No results found for this or any previous visit (from the  past 240 hour(s)).   Labs: BNP (last 3 results) No results for input(s): BNP in the last 8760 hours. Basic Metabolic Panel: Recent Labs  Lab 11/09/17 2218  NA 140  K 4.1  CL 106  CO2 26  GLUCOSE 110*  BUN 14  CREATININE 0.92  CALCIUM 9.6   Liver Function Tests: No results for input(s): AST, ALT, ALKPHOS, BILITOT, PROT, ALBUMIN in the last 168 hours. No results for input(s): LIPASE, AMYLASE in the last 168 hours. No results for input(s): AMMONIA in the last 168 hours. CBC: Recent Labs  Lab 11/09/17 2218 11/10/17 0832 11/11/17 0522 11/12/17 0415  WBC 10.1 10.2 8.6 8.4  HGB 14.1 13.4 12.9 12.4  HCT 41.0 40.1 37.8 36.5  MCV 89.3 89.3 88.7 89.0  PLT 265 276 258 269   Cardiac Enzymes: Recent Labs  Lab 11/10/17 0430 11/10/17 1113  TROPONINI 0.77* 0.61*   BNP: Invalid input(s): POCBNP CBG: No results for input(s): GLUCAP in the last 168 hours. D-Dimer No results for input(s): DDIMER in  the last 72 hours. Hgb A1c No results for input(s): HGBA1C in the last 72 hours. Lipid Profile No results for input(s): CHOL, HDL, LDLCALC, TRIG, CHOLHDL, LDLDIRECT in the last 72 hours. Thyroid function studies No results for input(s): TSH, T4TOTAL, T3FREE, THYROIDAB in the last 72 hours.  Invalid input(s): FREET3 Anemia work up No results for input(s): VITAMINB12, FOLATE, FERRITIN, TIBC, IRON, RETICCTPCT in the last 72 hours. Urinalysis    Component Value Date/Time   COLORURINE AMBER (A) 12/15/2010 0124   APPEARANCEUR CLOUDY (A) 12/15/2010 0124   LABSPEC 1.025 12/15/2010 0124   PHURINE 5.5 12/15/2010 0124   GLUCOSEU NEGATIVE 12/15/2010 0124   HGBUR MODERATE (A) 12/15/2010 0124   BILIRUBINUR NEGATIVE 12/15/2010 0124   KETONESUR TRACE (A) 12/15/2010 0124   PROTEINUR 30 (A) 12/15/2010 0124   UROBILINOGEN 0.2 12/15/2010 0124   NITRITE NEGATIVE 12/15/2010 0124   LEUKOCYTESUR LARGE (A) 12/15/2010 0124     SIGNED:   Cordelia Poche, MD [facility] 11/12/2017, 1:35  PM

## 2017-11-12 NOTE — Progress Notes (Signed)
   D/w [facility] - hospitalist MD - Dr Cordelia Poche - pccm will sign off.for any pulm followup [phone] is the number   Dr. Brand Males, M.D., [facility].C.P Pulmonary and Critical Care Medicine Staff Physician, [place] Director - Interstitial Lung Disease  Program  Pulmonary [place] [facility] [place], [place], [address]  Pager: [phone], If no answer or between  15:00h - 7:00h: call [phone] Telephone: [phone]

## 2017-11-15 DIAGNOSIS — I2699 Other pulmonary embolism without acute cor pulmonale: Secondary | ICD-10-CM | POA: Diagnosis not present

## 2017-11-15 DIAGNOSIS — Z79899 Other long term (current) drug therapy: Secondary | ICD-10-CM | POA: Diagnosis not present

## 2017-11-29 DIAGNOSIS — I2609 Other pulmonary embolism with acute cor pulmonale: Secondary | ICD-10-CM | POA: Diagnosis not present

## 2017-11-29 DIAGNOSIS — Z7901 Long term (current) use of anticoagulants: Secondary | ICD-10-CM | POA: Diagnosis not present

## 2017-12-11 ENCOUNTER — Other Ambulatory Visit: Payer: Self-pay | Admitting: Internal Medicine

## 2017-12-11 DIAGNOSIS — Z7901 Long term (current) use of anticoagulants: Secondary | ICD-10-CM | POA: Diagnosis not present

## 2017-12-11 DIAGNOSIS — B20 Human immunodeficiency virus [HIV] disease: Secondary | ICD-10-CM

## 2017-12-12 ENCOUNTER — Ambulatory Visit (INDEPENDENT_AMBULATORY_CARE_PROVIDER_SITE_OTHER): Payer: No Typology Code available for payment source | Admitting: Pulmonary Disease

## 2017-12-12 ENCOUNTER — Encounter: Payer: Self-pay | Admitting: Pulmonary Disease

## 2017-12-12 ENCOUNTER — Telehealth: Payer: Self-pay | Admitting: Pharmacist

## 2017-12-12 VITALS — BP 130/82 | HR 92 | Ht 70.0 in | Wt 295.0 lb

## 2017-12-12 DIAGNOSIS — I2699 Other pulmonary embolism without acute cor pulmonale: Secondary | ICD-10-CM | POA: Diagnosis not present

## 2017-12-12 NOTE — Telephone Encounter (Signed)
Sharyn Lull, RN, alerted me about patient being started on warfarin for her pulmonary embolism. Per pulmonology, she will be on warfarin x 3 months. There is an interaction between her HIV medication, Genvoya + Prezista, and the warfarin.  There is a potential for warfarin concentrations to be reduced while taking Genvoya. Patient will only be on warfarin for a short amount of time and will be getting frequent INR checks. Patient is coming in tomorrow to see Dr. Linus Salmons.

## 2017-12-12 NOTE — Progress Notes (Signed)
[name]    [id number]    1975/12/07  Primary Care Physician:Wolters, Ivin Booty, MD  Referring Physician: Jonathon Jordan, MD [place], [place] [address]  Chief complaint:  Shortness of breath Recent hospitalization for pulmonary embolism  HPI: Patient had ORIF July 17 Developed chest pain or shortness of breath Was found to have pulmonary embolism Currently on Coumadin-tolerating treatment well She still has some shortness of breath Did develop a hematoma around the surgical site-following up with surgery INR been followed by Coumadin clinic  This was a provoked event  Smoking history: Never smoker  Outpatient Encounter Medications as of 12/12/2017  Medication Sig  . GENVOYA 150-150-200-10 MG TABS tablet TAKE 1 TABLET BY MOUTH DAILY  . hydrochlorothiazide (MICROZIDE) 12.5 MG capsule Take 1 capsule (12.5 mg total) by mouth daily.  Marland Kitchen oxyCODONE (ROXICODONE) 5 MG immediate release tablet Take 1 tablet (5 mg total) by mouth every 6 (six) hours as needed for severe pain.  Marland Kitchen PREZISTA 800 MG tablet TAKE 1 TABLET(800 MG) BY MOUTH DAILY  . warfarin (COUMADIN) 5 MG tablet Take 2 tablets (10 mg total) by mouth one time only at 6 PM. F.u with PCP for continued Coumadin dosing and INR monitoring.  . [DISCONTINUED] enoxaparin (LOVENOX) 150 MG/ML injection Inject 1 mL (150 mg total) into the skin every 12 (twelve) hours.   No facility-administered encounter medications on file as of 12/12/2017.     Allergies as of 12/12/2017  . (Not on File)    Past Medical History:  Diagnosis Date  . Anxiety   . Candidal esophagitis ([place])   . Complication of anesthesia 07/2011   in PACU took long time for oxygen sts to come up,2008 tonsillectomy went to ICU  . Family history of adverse reaction to anesthesia    mother H/O n/v  . Fracture of right ankle, lateral malleolus   . HIV (human immunodeficiency virus infection) ([place])   . MAC (mycobacterium  avium-intracellulare complex)    positive culture, treated for 3 months  . Pulmonary embolism [facility])     Past Surgical History:  Procedure Laterality Date  . ANKLE ARTHROSCOPY WITH OPEN REDUCTION INTERNAL FIXATION (ORIF) Right 10/31/2017  . ANTERIOR CRUCIATE LIGAMENT REPAIR     Right and left repair  . LYMPH NODE BIOPSY Left 11/14/2016   Procedure: Left Cervical node I/D (incision and drain) and Biopsy;  Surgeon: [name], MD;  Location: [facility];  Service: Thoracic;  Laterality: Left;  . NASAL RECONSTRUCTION  1982  . ORIF ANKLE FRACTURE Right 11/01/2017   Procedure: OPEN REDUCTION INTERNAL FIXATION (ORIF) right lateral malleolus fracture;  Surgeon: Wylene Simmer, MD;  Location: [facility];  Service: Orthopedics;  Laterality: Right;  . [place] NODE BIOPSY  07/28/2011   Procedure: SUPRACLAVICAL NODE BIOPSY;  Surgeon: Nicanor Alcon, MD;  Location: [facility];  Service: Thoracic;  Laterality: Left;  . TONSILLECTOMY      Family History  Problem Relation Age of Onset  . Anesthesia problems Neg Hx   . Hypotension Neg Hx   . Malignant hyperthermia Neg Hx   . Pseudochol deficiency Neg Hx     Social History   Socioeconomic History  . Marital status: Single    Spouse name: Not on file  . Number of children: Not on file  . Years of education: Not on file  . Highest education level: Not on file  Occupational History  . Not on  file  Social Needs  . Financial resource strain: Not on file  . Food insecurity:    Worry: Not on file    Inability: Not on file  . Transportation needs:    Medical: Not on file    Non-medical: Not on file  Tobacco Use  . Smoking status: Never Smoker  . Smokeless tobacco: Never Used  Substance and Sexual Activity  . Alcohol use: Yes    Comment: socially  . Drug use: No  . Sexual activity: Not on file  Lifestyle  . Physical activity:    Days per week: Not on file    Minutes per session: Not on file  . Stress: Not on file    Relationships  . Social connections:    Talks on phone: Not on file    Gets together: Not on file    Attends religious service: Not on file    Active member of club or organization: Not on file    Attends meetings of clubs or organizations: Not on file    Relationship status: Not on file  . Intimate partner violence:    Fear of current or ex partner: Not on file    Emotionally abused: Not on file    Physically abused: Not on file    Forced sexual activity: Not on file  Other Topics Concern  . Not on file  Social History Narrative  . Not on file    Review of systems: Review of Systems  Constitutional: Negative for fever and chills.  HENT: Negative.   Respiratory: Shortness of breath with exertion-improving Cardiovascular: Negative for chest pain and palpitations.  Gastrointestinal: Negative for vomiting, diarrhea, blood per rectum. Genitourinary: Negative for dysuria, urgency, frequency and hematuria.  Musculoskeletal: She has a boot on the right foot  All other systems reviewed and are negative.  Physical Exam:  Vitals:   12/12/17 0909  BP: 130/82  Pulse: 92  SpO2: 96%   Gen:      No acute distress  HEENT:  EOMI, sclera anicteric Neck:     No masses; no thyromegaly Lungs:    Clear to auscultation bilaterally; normal respiratory effort CV:         Regular rate and rhythm; no murmurs Abd:      + bowel sounds; soft, non-tender; no palpable masses, no distension Ext:  Boot on right foot Skin:      Warm and dry; no rash Neuro: alert and oriented x 3 Psych: normal mood and affect  Data Reviewed: Marland Kitchen  Recent CT scan of the chest reviewed 11/10/2017  Recent echocardiogram reviewed 11/10/2017  Assessment:   .  Pulmonary embolism  .  Right ventricular strain  .  History of HIV-on medications  .  Recent surgery  Plan/Recommendations:   .  Continue anticoagulation-on Coumadin, being followed  .  Plan is to continue anticoagulation for 3 total months-provoked  event  .  We will repeat echocardiogram in 3 months to assess right ventricular function  .  No other intervention at present  Shortness of breath should gradually improve Patient had questions about if any other testing needs done at present time-if there is significant symptoms of worsening shortness of breath/chest pain-study may need repeated but not at the present time. She should continue to improve  Question regarding time off from work following a pulmonary embolism-I believe 2 to 3 weeks at best of time of work should be appropriate, other comorbidities may limit her ability to get back to  work   Sherrilyn Rist MD [facility] 12/12/2017, 9:37 AM  CC: Jonathon Jordan, MD

## 2017-12-12 NOTE — Patient Instructions (Signed)
Recent pulmonary embolism  Recent foot surgery  INR elevated  Shortness of breath improving not completely resolved   Continue anticoagulation for total of 3 months  Repeat echocardiogram prior to next visit in 3 months  No other changes to line of care  Call with any significant symptoms

## 2017-12-13 ENCOUNTER — Ambulatory Visit: Payer: BLUE CROSS/BLUE SHIELD | Admitting: Internal Medicine

## 2017-12-13 ENCOUNTER — Encounter: Payer: Self-pay | Admitting: Internal Medicine

## 2017-12-13 VITALS — BP 132/87 | HR 81 | Temp 98.7°F | Ht 70.0 in | Wt 287.4 lb

## 2017-12-13 DIAGNOSIS — B20 Human immunodeficiency virus [HIV] disease: Secondary | ICD-10-CM

## 2017-12-13 DIAGNOSIS — Z23 Encounter for immunization: Secondary | ICD-10-CM | POA: Insufficient documentation

## 2017-12-13 DIAGNOSIS — R599 Enlarged lymph nodes, unspecified: Secondary | ICD-10-CM

## 2017-12-13 DIAGNOSIS — I2699 Other pulmonary embolism without acute cor pulmonale: Secondary | ICD-10-CM

## 2017-12-13 DIAGNOSIS — R591 Generalized enlarged lymph nodes: Secondary | ICD-10-CM

## 2017-12-13 DIAGNOSIS — Z113 Encounter for screening for infections with a predominantly sexual mode of transmission: Secondary | ICD-10-CM

## 2017-12-13 NOTE — Assessment & Plan Note (Signed)
Discussed prevnar and given today

## 2017-12-13 NOTE — Assessment & Plan Note (Signed)
Screened negative 

## 2017-12-13 NOTE — Assessment & Plan Note (Signed)
Doing well and no changes.  rtc 6 months.

## 2017-12-13 NOTE — Assessment & Plan Note (Signed)
On coumadin and no signficant interaction with Genvoya, just can require more coumadin

## 2017-12-13 NOTE — Progress Notes (Signed)
   Subjective:    Patient ID: [name], female    DOB: [birth year], 42 y.o.   MRN: [id number]  HPI Here for follow up of HIV She has been on Genvoya and prezista with a history of 184V mutation.  No missed doses.  CD4 430, viral load < 20.  Since last visit broke her ankle, underwent surgery and developed a PE.  On coumadin.  Plan for 3 months of coumadin for provoked PE after surgery.  Neck swelling resolved and has remained stable off of azithromycin and ethambutol.  No associated n/v/d.  Gets PAP smear by OBGYN   Review of Systems  Constitutional: Negative for chills, fever and unexpected weight change.  Gastrointestinal: Negative for diarrhea and nausea.       Objective:   Physical Exam  Constitutional: She appears well-developed and well-nourished. No distress.  HENT:  Mouth/Throat: No oropharyngeal exudate.  Eyes: No scleral icterus.  Cardiovascular: Normal rate, regular rhythm and normal heart sounds.  No murmur heard. Pulmonary/Chest: Effort normal and breath sounds normal. No respiratory distress.  Skin: No rash noted.   SH: no tobacco       Assessment & Plan:

## 2017-12-13 NOTE — Assessment & Plan Note (Signed)
Has resolved and off of treatment.

## 2017-12-18 DIAGNOSIS — Z7901 Long term (current) use of anticoagulants: Secondary | ICD-10-CM | POA: Diagnosis not present

## 2017-12-20 DIAGNOSIS — N130 Hydronephrosis with ureteropelvic junction obstruction: Secondary | ICD-10-CM | POA: Diagnosis not present

## 2017-12-20 DIAGNOSIS — R350 Frequency of micturition: Secondary | ICD-10-CM | POA: Diagnosis not present

## 2017-12-20 DIAGNOSIS — R8271 Bacteriuria: Secondary | ICD-10-CM | POA: Diagnosis not present

## 2017-12-20 DIAGNOSIS — R3914 Feeling of incomplete bladder emptying: Secondary | ICD-10-CM | POA: Diagnosis not present

## 2017-12-20 DIAGNOSIS — R351 Nocturia: Secondary | ICD-10-CM | POA: Diagnosis not present

## 2017-12-20 DIAGNOSIS — N393 Stress incontinence (female) (male): Secondary | ICD-10-CM | POA: Diagnosis not present

## 2017-12-27 ENCOUNTER — Telehealth: Payer: Self-pay

## 2017-12-27 NOTE — Telephone Encounter (Signed)
Patient is overdue for a pap smear. Last office note states patient goes to OBGYN for pap. If patient calls back just need to know when her last pap smear was done.  [place]

## 2017-12-28 NOTE — Telephone Encounter (Signed)
Patient returned missed call from yesterday. Patient states she gets an annual pap smear done at [facility] office. She is scheduled to have her next pap 10/01. [name]

## 2018-01-01 DIAGNOSIS — N130 Hydronephrosis with ureteropelvic junction obstruction: Secondary | ICD-10-CM | POA: Diagnosis not present

## 2018-01-01 DIAGNOSIS — N1339 Other hydronephrosis: Secondary | ICD-10-CM | POA: Diagnosis not present

## 2018-01-02 DIAGNOSIS — Z7901 Long term (current) use of anticoagulants: Secondary | ICD-10-CM | POA: Diagnosis not present

## 2018-01-08 DIAGNOSIS — N393 Stress incontinence (female) (male): Secondary | ICD-10-CM | POA: Diagnosis not present

## 2018-01-08 DIAGNOSIS — N130 Hydronephrosis with ureteropelvic junction obstruction: Secondary | ICD-10-CM | POA: Diagnosis not present

## 2018-01-11 DIAGNOSIS — Z23 Encounter for immunization: Secondary | ICD-10-CM | POA: Diagnosis not present

## 2018-01-11 DIAGNOSIS — Z7901 Long term (current) use of anticoagulants: Secondary | ICD-10-CM | POA: Diagnosis not present

## 2018-01-15 DIAGNOSIS — Z6841 Body Mass Index (BMI) 40.0 and over, adult: Secondary | ICD-10-CM | POA: Diagnosis not present

## 2018-01-15 DIAGNOSIS — Z01419 Encounter for gynecological examination (general) (routine) without abnormal findings: Secondary | ICD-10-CM | POA: Diagnosis not present

## 2018-01-15 DIAGNOSIS — Z124 Encounter for screening for malignant neoplasm of cervix: Secondary | ICD-10-CM | POA: Diagnosis not present

## 2018-01-15 DIAGNOSIS — R87612 Low grade squamous intraepithelial lesion on cytologic smear of cervix (LGSIL): Secondary | ICD-10-CM | POA: Diagnosis not present

## 2018-02-06 DIAGNOSIS — Z7901 Long term (current) use of anticoagulants: Secondary | ICD-10-CM | POA: Diagnosis not present

## 2018-02-11 ENCOUNTER — Telehealth: Payer: Self-pay

## 2018-02-11 NOTE — Telephone Encounter (Signed)
I would like to examine it to see if it is truly lymphadenopathy.  It may just be structural.  She is well out of the woods for a return of the infection.

## 2018-02-11 NOTE — Telephone Encounter (Signed)
Patient says bulging with some discharge of her neck has returned and she thinks she may need to restart Zithromax and ethambutol for MAC.    Please advise.    Pharmacy: [address] [place] [address].

## 2018-02-14 ENCOUNTER — Encounter: Payer: Self-pay | Admitting: Internal Medicine

## 2018-02-14 ENCOUNTER — Ambulatory Visit: Payer: BLUE CROSS/BLUE SHIELD | Admitting: Internal Medicine

## 2018-02-14 VITALS — BP 138/95 | HR 80 | Temp 98.4°F | Wt 302.0 lb

## 2018-02-14 DIAGNOSIS — R591 Generalized enlarged lymph nodes: Secondary | ICD-10-CM

## 2018-02-14 DIAGNOSIS — L988 Other specified disorders of the skin and subcutaneous tissue: Secondary | ICD-10-CM | POA: Diagnosis not present

## 2018-02-14 DIAGNOSIS — R599 Enlarged lymph nodes, unspecified: Secondary | ICD-10-CM

## 2018-02-14 DIAGNOSIS — B20 Human immunodeficiency virus [HIV] disease: Secondary | ICD-10-CM

## 2018-02-14 NOTE — Progress Notes (Signed)
   Subjective:    Patient ID: [name], female    DOB: 12/22/1975, 42 y.o.   MRN: [id number]  HPI Here for a work in visit for drainage of her previous wound at the site of her surgery. Had granulomatous lymph swelling in 2013 associated with new diagnosis of HIV and MAI blood infection and completed treatment after more than 1 year.  She developed some new swelling in 2018 and I again started her on treatment and she saw Dr. Servando Snare for evaluation.  Had a draining fisutla and taken to the OR in July 2018 for debridement and culture but negative growth.   Continued good compliance with ARVs.  Here now with more pus-like drainage.  No fever, chills.    Review of Systems  Constitutional: Negative for chills, fever and unexpected weight change.  HENT: Negative for trouble swallowing.   Gastrointestinal: Negative for diarrhea.  Musculoskeletal: Negative for neck pain.  Skin: Negative for rash.       Objective:   Physical Exam  Constitutional: She appears well-developed and well-nourished. No distress.  HENT:  Mouth/Throat: No oropharyngeal exudate.  Neck:  Neck without significant lymphadenopathy; surgical scar noted and no drainage, dry area with no warmth, no tenderness.     SH: no tobacco       Assessment & Plan:

## 2018-02-14 NOTE — Assessment & Plan Note (Signed)
She endorses continued good compliance.

## 2018-02-14 NOTE — Assessment & Plan Note (Signed)
This is a worsening problem.  It had been closed.  I don't feel any significant adenopathy, no tenderness or concerns on exam though.  With drainage history, I will recheck a CT scan with contrast for ? Adenopathy, fistula tract.  I will discuss with Dr. Servando Snare if there are any concerns.

## 2018-02-15 NOTE — Telephone Encounter (Signed)
Seen 10/31 by Dr Linus Salmons.

## 2018-02-20 DIAGNOSIS — J069 Acute upper respiratory infection, unspecified: Secondary | ICD-10-CM | POA: Diagnosis not present

## 2018-02-21 ENCOUNTER — Ambulatory Visit
Admission: RE | Admit: 2018-02-21 | Discharge: 2018-02-21 | Disposition: A | Discharge status: Home / Self Care | Payer: BLUE CROSS/BLUE SHIELD | Source: Ambulatory Visit | Attending: Internal Medicine | Admitting: Internal Medicine

## 2018-02-21 ENCOUNTER — Other Ambulatory Visit: Payer: Self-pay | Admitting: Internal Medicine

## 2018-02-21 DIAGNOSIS — R599 Enlarged lymph nodes, unspecified: Secondary | ICD-10-CM

## 2018-02-21 DIAGNOSIS — R591 Generalized enlarged lymph nodes: Secondary | ICD-10-CM

## 2018-02-21 MED ORDER — IOPAMIDOL (ISOVUE-300) INJECTION 61%
75.0000 mL | Freq: Once | INTRAVENOUS | Status: AC | PRN
  Administered 2018-02-21: 75 mL via INTRAVENOUS

## 2018-02-22 ENCOUNTER — Other Ambulatory Visit: Payer: Self-pay | Admitting: Internal Medicine

## 2018-02-22 ENCOUNTER — Telehealth: Payer: Self-pay | Admitting: *Deleted

## 2018-02-22 ENCOUNTER — Other Ambulatory Visit: Payer: Self-pay | Admitting: Behavioral Health

## 2018-02-22 MED ORDER — AZITHROMYCIN 500 MG PO TABS: 500.0000 mg | ORAL_TABLET | Freq: Every day | ORAL | 11 refills | Status: DC

## 2018-02-22 MED ORDER — ETHAMBUTOL HCL 400 MG PO TABS: 15.0000 mg/kg | ORAL_TABLET | Freq: Every day | ORAL | 11 refills | Status: DC

## 2018-02-22 NOTE — Telephone Encounter (Signed)
Per Dr Linus Salmons, scheduled patient with [facility] Monday afternoon, 11/11 3:15. Landis Gandy, RN

## 2018-02-25 ENCOUNTER — Ambulatory Visit (INDEPENDENT_AMBULATORY_CARE_PROVIDER_SITE_OTHER): Payer: No Typology Code available for payment source | Admitting: Pharmacist

## 2018-02-25 DIAGNOSIS — B20 Human immunodeficiency virus [HIV] disease: Secondary | ICD-10-CM | POA: Diagnosis not present

## 2018-02-25 DIAGNOSIS — A310 Pulmonary mycobacterial infection: Secondary | ICD-10-CM | POA: Diagnosis not present

## 2018-02-25 MED ORDER — ETHAMBUTOL HCL 400 MG PO TABS: 2000.0000 mg | ORAL_TABLET | Freq: Every day | ORAL | 11 refills | Status: DC

## 2018-02-25 MED ORDER — AZITHROMYCIN 500 MG PO TABS: 500.0000 mg | ORAL_TABLET | Freq: Every day | ORAL | 11 refills | Status: DC

## 2018-02-25 NOTE — Progress Notes (Signed)
HPI: [name] is a 42 y.o. female presenting for MAI infection medication management.  Patient Active Problem List   Diagnosis Date Noted  . Need for prophylactic vaccination against Streptococcus pneumoniae (pneumococcus) 12/13/2017  . Right leg DVT ([place]) 11/11/2017  . Bilateral pulmonary embolism ([place]) 11/10/2017  . Granulomatous adenopathy 12/21/2016  . Fistula, skin 10/03/2016  . Screening examination for venereal disease 08/06/2013  . Encounter for long-term (current) use of high-risk medication 08/06/2013  . HIV disease ([place]) 12/29/2010  . Anxiety 12/29/2010    Patient's Medications  New Prescriptions   No medications on file  Previous Medications   GENVOYA 150-150-200-10 MG TABS TABLET    TAKE 1 TABLET BY MOUTH DAILY   HYDROCHLOROTHIAZIDE (MICROZIDE) 12.5 MG CAPSULE    Take 1 capsule (12.5 mg total) by mouth daily.   PREZISTA 800 MG TABLET    TAKE 1 TABLET(800 MG) BY MOUTH DAILY   WARFARIN (COUMADIN) 5 MG TABLET    Take 2 tablets (10 mg total) by mouth one time only at 6 PM. F.u with PCP for continued Coumadin dosing and INR monitoring.  Modified Medications   No medications on file  Discontinued Medications   No medications on file    Allergies: Allergies  Allergen Reactions  . Bactrim [Sulfamethoxazole-Trimethoprim]     Past Medical History: Past Medical History:  Diagnosis Date  . Anxiety   . Candidal esophagitis ([place])   . Complication of anesthesia 07/2011   in PACU took long time for oxygen sts to come up,2008 tonsillectomy went to ICU  . Family history of adverse reaction to anesthesia    mother H/O n/v  . Fracture of right ankle, lateral malleolus   . HIV (human immunodeficiency virus infection) ([place])   . MAC (mycobacterium avium-intracellulare complex)    positive culture, treated for 3 months  . Pulmonary embolism [facility])     Social History: Social History   Socioeconomic History  . Marital status: Single    Spouse name: Not on file    . Number of children: Not on file  . Years of education: Not on file  . Highest education level: Not on file  Occupational History  . Not on file  Social Needs  . Financial resource strain: Not on file  . Food insecurity:    Worry: Not on file    Inability: Not on file  . Transportation needs:    Medical: Not on file    Non-medical: Not on file  Tobacco Use  . Smoking status: Never Smoker  . Smokeless tobacco: Never Used  Substance and Sexual Activity  . Alcohol use: Yes    Comment: socially  . Drug use: No  . Sexual activity: Not on file    Comment: declined condoms  Lifestyle  . Physical activity:    Days per week: Not on file    Minutes per session: Not on file  . Stress: Not on file  Relationships  . Social connections:    Talks on phone: Not on file    Gets together: Not on file    Attends religious service: Not on file    Active member of club or organization: Not on file    Attends meetings of clubs or organizations: Not on file    Relationship status: Not on file  Other Topics Concern  . Not on file  Social History Narrative  . Not on file    Labs: Lab Results  Component Value Date   HIV1RNAQUANT <  20 DETECTED (A) 10/17/2017   HIV1RNAQUANT 98 (H) 04/03/2017   HIV1RNAQUANT <20 DETECTED (A) 10/03/2016   CD4TABS 430 10/17/2017   CD4TABS 690 04/03/2017   CD4TABS 650 10/03/2016    RPR and STI Lab Results  Component Value Date   LABRPR NON-REACTIVE 10/17/2017   LABRPR NON REAC 10/03/2016   LABRPR NON REAC 11/30/2015   LABRPR NON REAC 11/15/2015   LABRPR NON REAC 12/15/2013    STI Results GC CT  11/30/2015 Negative Negative  11/15/2015 Negative Negative    Hepatitis B Lab Results  Component Value Date   HEPBSAB POS (A) 12/29/2010   HEPBSAG NEGATIVE 12/29/2010   HEPBCAB NEG 12/29/2010   Hepatitis C No results found for: HEPCAB, HCVRNAPCRQN Hepatitis A Lab Results  Component Value Date   HAV NEG 12/29/2010   Lipids: Lab Results   Component Value Date   CHOL 210 (H) 10/03/2016   TRIG 114 10/03/2016   HDL 46 (L) 10/03/2016   CHOLHDL 4.6 10/03/2016   VLDL 23 10/03/2016   LDLCALC 141 (H) 10/03/2016    Current HIV Regimen: Genvoya + Prezista  Assessment: [name] is here today at the request of [name] for review of potential DDIs with HIV, PE, and MAI treatment. [name] is currently taking Genvoya and Prezista for HIV, warfarin for provoked DVT/PE occurring in 10/2017, and needs to restart treatment for MAI lymphadenopathy. Dr. Ander [name], [name]'s pulmonologist, has planned to treat her with warfarin for 3 months for the DVT/PE. They are aware of the DDI with warfarin and her HIV medications. [name] has her INR checked every 3-4 weeks and says it has usually been in range. She is to have an ECHO next week and will follow up with pulmonology for further treatment decisions.  There are no significant drug interactions between any of [name]'s current medications and azithromycin and ethambutol. We will plan to use this treatment regimen for her MAI lymphadenopathy. She has taken this in the past and has tolerated it fairly well, except for diarrhea that she associates with azithromycin. We encouraged [name] to address this with [name] at her next follow up if she is still experiencing significant diarrhea after several weeks on the medications.  We will draw HIV labs today for review at her follow up appointment with [name]. We also offered [name] a flu shot but she has already received it elsewhere.  Plan: HIV RNA and CD4 today Start azithromycin 548m PO daily Start ethambutol 20089m(~1541mg) PO daily F/u with Dr. ComLinus [name] 12/2 at [address] [facility] PGY2 Infectious Diseases Pharmacy Resident Phone: [phone]/02/2018, 3:18 PM

## 2018-02-25 NOTE — Addendum Note (Signed)
Addended by: Dolan Amen D on: 02/25/2018 05:21 PM   Modules accepted: Orders

## 2018-02-27 LAB — HIV-1 RNA QUANT-NO REFLEX-BLD
HIV 1 RNA Quant: 42 copies/mL — ABNORMAL HIGH
HIV-1 RNA Quant, Log: 1.62 Log copies/mL — ABNORMAL HIGH

## 2018-02-27 LAB — T-HELPER CELL (CD4) - (RCID CLINIC ONLY)
CD4 % Helper T Cell: 26 % — ABNORMAL LOW (ref 33–55)
CD4 T CELL ABS: 590 /uL (ref 400–2700)

## 2018-03-04 ENCOUNTER — Other Ambulatory Visit: Payer: Self-pay

## 2018-03-04 ENCOUNTER — Ambulatory Visit (HOSPITAL_COMMUNITY): Payer: No Typology Code available for payment source | Attending: Cardiology

## 2018-03-04 DIAGNOSIS — I2699 Other pulmonary embolism without acute cor pulmonale: Secondary | ICD-10-CM | POA: Diagnosis not present

## 2018-03-05 DIAGNOSIS — Z7901 Long term (current) use of anticoagulants: Secondary | ICD-10-CM | POA: Diagnosis not present

## 2018-03-18 ENCOUNTER — Ambulatory Visit: Payer: Self-pay | Admitting: Internal Medicine

## 2018-03-19 DIAGNOSIS — Z7901 Long term (current) use of anticoagulants: Secondary | ICD-10-CM | POA: Diagnosis not present

## 2018-03-19 DIAGNOSIS — N921 Excessive and frequent menstruation with irregular cycle: Secondary | ICD-10-CM | POA: Diagnosis not present

## 2018-03-20 ENCOUNTER — Ambulatory Visit (INDEPENDENT_AMBULATORY_CARE_PROVIDER_SITE_OTHER): Payer: No Typology Code available for payment source | Admitting: Pulmonary Disease

## 2018-03-20 ENCOUNTER — Encounter: Payer: Self-pay | Admitting: Pulmonary Disease

## 2018-03-20 VITALS — BP 124/82 | HR 80 | Ht 70.0 in | Wt 302.0 lb

## 2018-03-20 DIAGNOSIS — I749 Embolism and thrombosis of unspecified artery: Secondary | ICD-10-CM | POA: Diagnosis not present

## 2018-03-20 NOTE — Progress Notes (Signed)
[name]    [id number]    09/28/1975  Primary Care Physician:Wolters, Ivin Booty, MD  Referring Physician: Jonathon Jordan, MD [address] [place], [place] [address]  Chief complaint:  Has no significant complaints today Follow-up for pulmonary embolism  HPI: Was treated with Coumadin for provoked blood clot She has been stable with no significant complaints  Patient had ORIF July 17 Developed chest pain or shortness of breath Was found to have pulmonary embolism  INR been followed by Coumadin clinic  This was a provoked event  Smoking history: Never smoker  Outpatient Encounter Medications as of 03/20/2018  Medication Sig  . azithromycin (ZITHROMAX) 500 MG tablet Take 1 tablet (500 mg total) by mouth daily.  Marland Kitchen ethambutol (MYAMBUTOL) 400 MG tablet Take 5 tablets (2,000 mg total) by mouth daily.  . GENVOYA 150-150-200-10 MG TABS tablet TAKE 1 TABLET BY MOUTH DAILY  . hydrochlorothiazide (MICROZIDE) 12.5 MG capsule Take 1 capsule (12.5 mg total) by mouth daily.  Marland Kitchen PREZISTA 800 MG tablet TAKE 1 TABLET(800 MG) BY MOUTH DAILY  . warfarin (COUMADIN) 5 MG tablet Take 2 tablets (10 mg total) by mouth one time only at 6 PM. F.u with PCP for continued Coumadin dosing and INR monitoring.   No facility-administered encounter medications on file as of 03/20/2018.     Allergies as of 03/20/2018 - Review Complete 03/20/2018  Allergen Reaction Noted  . Bactrim [sulfamethoxazole-trimethoprim]  12/13/2017    Past Medical History:  Diagnosis Date  . Anxiety   . Candidal esophagitis ([place])   . Complication of anesthesia 07/2011   in PACU took long time for oxygen sts to come up,2008 tonsillectomy went to ICU  . Family history of adverse reaction to anesthesia    mother H/O n/v  . Fracture of right ankle, lateral malleolus   . HIV (human immunodeficiency virus infection) ([place])   . MAC (mycobacterium avium-intracellulare complex)    positive culture,  treated for 3 months  . Pulmonary embolism [facility])     Past Surgical History:  Procedure Laterality Date  . ANKLE ARTHROSCOPY WITH OPEN REDUCTION INTERNAL FIXATION (ORIF) Right 10/31/2017  . ANTERIOR CRUCIATE LIGAMENT REPAIR     Right and left repair  . LYMPH NODE BIOPSY Left 11/14/2016   Procedure: Left Cervical node I/D (incision and drain) and Biopsy;  Surgeon: [name], MD;  Location: [facility];  Service: Thoracic;  Laterality: Left;  . NASAL RECONSTRUCTION  1982  . ORIF ANKLE FRACTURE Right 11/01/2017   Procedure: OPEN REDUCTION INTERNAL FIXATION (ORIF) right lateral malleolus fracture;  Surgeon: Wylene Simmer, MD;  Location: [facility];  Service: Orthopedics;  Laterality: Right;  . [place] NODE BIOPSY  07/28/2011   Procedure: SUPRACLAVICAL NODE BIOPSY;  Surgeon: Nicanor Alcon, MD;  Location: [facility];  Service: Thoracic;  Laterality: Left;  . TONSILLECTOMY      Family History  Problem Relation Age of Onset  . Anesthesia problems Neg Hx   . Hypotension Neg Hx   . Malignant hyperthermia Neg Hx   . Pseudochol deficiency Neg Hx     Social History   Socioeconomic History  . Marital status: Single    Spouse name: Not on file  . Number of children: Not on file  . Years of education: Not on file  . Highest education level: Not on file  Occupational History  . Not on file  Social Needs  . Financial resource strain: Not  on file  . Food insecurity:    Worry: Not on file    Inability: Not on file  . Transportation needs:    Medical: Not on file    Non-medical: Not on file  Tobacco Use  . Smoking status: Never Smoker  . Smokeless tobacco: Never Used  Substance and Sexual Activity  . Alcohol use: Yes    Comment: socially  . Drug use: No  . Sexual activity: Not on file    Comment: declined condoms  Lifestyle  . Physical activity:    Days per week: Not on file    Minutes per session: Not on file  . Stress: Not on file  Relationships  . Social  connections:    Talks on phone: Not on file    Gets together: Not on file    Attends religious service: Not on file    Active member of club or organization: Not on file    Attends meetings of clubs or organizations: Not on file    Relationship status: Not on file  . Intimate partner violence:    Fear of current or ex partner: Not on file    Emotionally abused: Not on file    Physically abused: Not on file    Forced sexual activity: Not on file  Other Topics Concern  . Not on file  Social History Narrative  . Not on file    Review of systems: Review of Systems  Constitutional: Negative for fever and chills.  HENT: Negative.   Respiratory: No shortness of breath  Cardiovascular: No chest pains or discomfort All other systems reviewed and are negative.  Physical Exam:  Vitals:   03/20/18 1036  BP: 124/82  Pulse: 80  SpO2: 97%   Gen:      No acute distress  HEENT: Anicteric Neck:     Megaly Lungs:    Clear breath sounds, no wheezes no rales CV:         Regular rate and rhythm; no murmurs Abd:      + bowel sounds; soft, non-tender; no palpable masses, no distension Psych: normal mood and affect  Data Reviewed: Marland Kitchen  Recent CT scan of the chest reviewed 11/10/2017  Recent echocardiogram reviewed 03/04/2018-resolution of right ventricular strain  Assessment:   .  Pulmonary embolism -Fully treated with Coumadin  .  Right ventricular strain -Resolved  .  History of HIV-on medications  .  Recent surgery -Fully recovered  Plan/Recommendations:   .  Discontinue Coumadin following 3 months of treatment for provoked pulmonary embolus  Her right ventricular strain is resolved  .  No other intervention at present  .  No other testing needed at present  .  I will see her in the office as needed   Sherrilyn Rist MD [facility] 03/20/2018, 10:56 AM  CC: Jonathon Jordan, MD

## 2018-03-20 NOTE — Patient Instructions (Signed)
Managed for provoked thromboembolic event  Has been over 3 months since the event  Your echocardiogram was no significant abnormality, the strain on your heart from a blood clot has resolved   You can stop anticoagulation  I will see you just as needed

## 2018-03-25 ENCOUNTER — Ambulatory Visit: Payer: No Typology Code available for payment source | Admitting: Internal Medicine

## 2018-03-25 ENCOUNTER — Encounter: Payer: Self-pay | Admitting: Internal Medicine

## 2018-03-25 VITALS — BP 136/84 | HR 86 | Temp 97.5°F | Wt 305.0 lb

## 2018-03-25 DIAGNOSIS — I2699 Other pulmonary embolism without acute cor pulmonale: Secondary | ICD-10-CM

## 2018-03-25 DIAGNOSIS — Z79899 Other long term (current) drug therapy: Secondary | ICD-10-CM | POA: Diagnosis not present

## 2018-03-25 DIAGNOSIS — Z86711 Personal history of pulmonary embolism: Secondary | ICD-10-CM

## 2018-03-25 DIAGNOSIS — B20 Human immunodeficiency virus [HIV] disease: Secondary | ICD-10-CM | POA: Diagnosis not present

## 2018-03-25 DIAGNOSIS — Z5181 Encounter for therapeutic drug level monitoring: Secondary | ICD-10-CM | POA: Insufficient documentation

## 2018-03-25 DIAGNOSIS — R599 Enlarged lymph nodes, unspecified: Secondary | ICD-10-CM | POA: Diagnosis not present

## 2018-03-25 LAB — COMPLETE METABOLIC PANEL WITH GFR
AG RATIO: 1.2 (calc) (ref 1.0–2.5)
ALKALINE PHOSPHATASE (APISO): 71 U/L (ref 33–115)
ALT: 17 U/L (ref 6–29)
AST: 15 U/L (ref 10–30)
Albumin: 3.7 g/dL (ref 3.6–5.1)
BILIRUBIN TOTAL: 0.4 mg/dL (ref 0.2–1.2)
BUN: 12 mg/dL (ref 7–25)
CHLORIDE: 103 mmol/L (ref 98–110)
CO2: 31 mmol/L (ref 20–32)
Calcium: 9 mg/dL (ref 8.6–10.2)
Creat: 0.91 mg/dL (ref 0.50–1.10)
GFR, Est African American: 91 mL/min/{1.73_m2} (ref >=60)
GFR, Est Non African American: 78 mL/min/{1.73_m2} (ref >=60)
Globulin: 3 g/dL (calc) (ref 1.9–3.7)
Glucose, Bld: 94 mg/dL (ref 65–99)
POTASSIUM: 4.1 mmol/L (ref 3.5–5.3)
Sodium: 139 mmol/L (ref 135–146)
Total Protein: 6.7 g/dL (ref 6.1–8.1)

## 2018-03-25 NOTE — Assessment & Plan Note (Signed)
Unusual reoccurence but on appropriate treatment.  Will continue.  Duration really unknown but will consider 3-6 months.  Follow up in 2 months.

## 2018-03-25 NOTE — Assessment & Plan Note (Signed)
Now off of coumadin so no issues with drug interactions

## 2018-03-25 NOTE — Assessment & Plan Note (Signed)
Will check LFTs on her MAI treatment

## 2018-03-25 NOTE — Telephone Encounter (Signed)
RN requested pap results from OBGYN office to be faxed to [phone]. Landis Gandy, RN

## 2018-03-25 NOTE — Assessment & Plan Note (Signed)
Doing well on current regimen, no changes.

## 2018-03-25 NOTE — Progress Notes (Signed)
   Subjective:    Patient ID: [name], female    DOB: [birth year], 42 y.o.   MRN: [id number]  HPI She is here for follow-up of HIV and granulomatous adenopathy. She continues on Genvoya with Prezista for her salvage regimen.  No issues taking that her CD4 count is 500 with a viral load of just 42 copies.  No missed doses or new issues.  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  This is a recurrent problem and she has continued drainage from the area though it has decreased some.  CT scan does confirm lymphadenopathy.  This is despite previous appropriate treatment.  Also she did have a history of a pulmonary embolus and now is off of her Coumadin therapy.   Review of Systems  Constitutional: Negative for fatigue and fever.  Skin: Negative for rash.       Objective:   Physical Exam  Constitutional: She appears well-developed and well-nourished.  Eyes: No scleral icterus.  Neck:  Open area with dried discharge, no current drainage.  Some lad noted.  No tenderness.   Cardiovascular: Normal rate, regular rhythm and normal heart sounds.  Pulmonary/Chest: Effort normal and breath sounds normal. No respiratory distress.  Skin: No rash noted.   SH: no tobacco       Assessment & Plan:

## 2018-04-23 ENCOUNTER — Other Ambulatory Visit: Payer: Self-pay

## 2018-04-23 DIAGNOSIS — I82401 Acute embolism and thrombosis of unspecified deep veins of right lower extremity: Secondary | ICD-10-CM

## 2018-04-30 DIAGNOSIS — R87612 Low grade squamous intraepithelial lesion on cytologic smear of cervix (LGSIL): Secondary | ICD-10-CM | POA: Diagnosis not present

## 2018-04-30 DIAGNOSIS — Z3202 Encounter for pregnancy test, result negative: Secondary | ICD-10-CM | POA: Diagnosis not present

## 2018-04-30 DIAGNOSIS — Z6841 Body Mass Index (BMI) 40.0 and over, adult: Secondary | ICD-10-CM | POA: Diagnosis not present

## 2018-05-09 ENCOUNTER — Telehealth: Payer: Self-pay | Admitting: *Deleted

## 2018-05-09 NOTE — Telephone Encounter (Signed)
Called and spoke with the patient, scheduled a new patient appt for 1/28 at 9:45am with Dr. Fermin Schwab. Gave the patient the instructions for free valet and pelvic exam

## 2018-05-14 ENCOUNTER — Inpatient Hospital Stay: Payer: BLUE CROSS/BLUE SHIELD | Attending: Gynecology | Admitting: Gynecology

## 2018-05-14 ENCOUNTER — Encounter: Payer: Self-pay | Admitting: Gynecology

## 2018-05-14 VITALS — BP 150/90 | HR 90 | Temp 98.9°F | Resp 18 | Ht 70.0 in | Wt 299.0 lb

## 2018-05-14 DIAGNOSIS — N920 Excessive and frequent menstruation with regular cycle: Secondary | ICD-10-CM

## 2018-05-14 DIAGNOSIS — R87810 Cervical high risk human papillomavirus (HPV) DNA test positive: Secondary | ICD-10-CM | POA: Insufficient documentation

## 2018-05-14 DIAGNOSIS — D259 Leiomyoma of uterus, unspecified: Secondary | ICD-10-CM

## 2018-05-14 DIAGNOSIS — R339 Retention of urine, unspecified: Secondary | ICD-10-CM

## 2018-05-14 DIAGNOSIS — R87612 Low grade squamous intraepithelial lesion on cytologic smear of cervix (LGSIL): Secondary | ICD-10-CM

## 2018-05-14 DIAGNOSIS — Z86711 Personal history of pulmonary embolism: Secondary | ICD-10-CM

## 2018-05-14 DIAGNOSIS — R87610 Atypical squamous cells of undetermined significance on cytologic smear of cervix (ASC-US): Secondary | ICD-10-CM

## 2018-05-14 NOTE — Patient Instructions (Signed)
Recommendation is for a repeat pap smear in six months with Dr. Harrington Challenger.  Please call for any questions or concerns. No follow up needed at this time with GYN Oncology.

## 2018-05-14 NOTE — Progress Notes (Signed)
Consult Note: Gyn-Onc   [name] 43 y.o. female  Chief Complaint  Patient presents with  . LGSIL on Pap smear of cervix    Assessment : Recent Pap smear showing LGSIL.  Subsequent Pap smear is negative.  High risk HPV is been identified.  Large uterine fibroids deviating the cervix making it impossible to perform colposcopy.  Symptomatic uterine fibroids apparently causing urinary retention as well as menorrhagia.  Plan: Regarding her Pap smear I would simply recommend she have repeat Pap smear in 6 months.  In general I would recommend the patient have a hysterectomy for uterine fibroids however her recent history of pulmonary embolism following ankle surgery would place her at significantly high risk for surgical complications including recurrent VTE.   HPI: 43 year old African-American female seen in consultation the request of Dr. Harrington Challenger regarding management of abnormal Pap smear.  Patient reports she is always had normal Pap smears until 1 obtained in early January 2019.  Colposcopy was undertaken however the cervix could not be visualized.  Subsequent Pap smear on April 30, 2018 showed no abnormality on cytology although there were high risk HPV types identified.  Patient also notes that she has menorrhagia.  She has known uterine fibroids.  She reports that she saw urologist relatively recently who felt that she was having urinary retention secondary to her fibroids.  Review of Systems:10 point review of systems is negative except as noted in interval history.   Vitals: Blood pressure (!) 150/90, pulse 90, temperature 98.9 F (37.2 C), temperature source Oral, resp. rate 18, height _0  (1.778 m), weight 299 lb (135.6 kg), SpO2 100 %.  Physical Exam: General : The patient is a healthy woman in no acute distress.  HEENT: normocephalic, extraoccular movements normal; neck is supple without thyromegally  Lynphnodes: Supraclavicular and inguinal nodes not enlarged   Abdomen: Obese, soft, non-tender, no ascites, no organomegally, no masses, no hernias  Pelvic:  EGBUS: Normal female  Vagina: Normal, no lesions  Urethra and Bladder: Normal, non-tender  Cervix: Deviated anteriorly on bimanual exam.  I am unable to identify the cervix with speculum exam. Uterus: Enlarged extending to nearly the umbilicus consistent with uterine fibroids. Bi-manual examination: Non-tender; no adenxal masses or nodularity  Rectal: normal sphincter tone, no masses, no blood  Lower extremities: No edema or varicosities. Normal range of motion      Allergies  Allergen Reactions  . Bactrim [Sulfamethoxazole-Trimethoprim]     Pt states that the ATB is not effective.  She has developed a resistance.    Past Medical History:  Diagnosis Date  . Anxiety   . Candidal esophagitis ([place])   . Complication of anesthesia 07/2011   in PACU took long time for oxygen sts to come up,2008 tonsillectomy went to ICU  . Family history of adverse reaction to anesthesia    mother H/O n/v  . Fracture of right ankle, lateral malleolus   . HIV (human immunodeficiency virus infection) ([place])   . MAC (mycobacterium avium-intracellulare complex)    positive culture, treated for 3 months  . Pulmonary embolism [facility])     Past Surgical History:  Procedure Laterality Date  . ANKLE ARTHROSCOPY WITH OPEN REDUCTION INTERNAL FIXATION (ORIF) Right 10/31/2017  . ANTERIOR CRUCIATE LIGAMENT REPAIR     Right and left repair  . LYMPH NODE BIOPSY Left 11/14/2016   Procedure: Left Cervical node I/D (incision and drain) and Biopsy;  Surgeon: [name], MD;  Location: [facility];  Service: Thoracic;  Laterality: Left;  . NASAL RECONSTRUCTION  1982  . ORIF ANKLE FRACTURE Right 11/01/2017   Procedure: OPEN REDUCTION INTERNAL FIXATION (ORIF) right lateral malleolus fracture;  Surgeon: Wylene Simmer, MD;  Location: [facility];  Service: Orthopedics;  Laterality: Right;  . [place] NODE  BIOPSY  07/28/2011   Procedure: SUPRACLAVICAL NODE BIOPSY;  Surgeon: Nicanor Alcon, MD;  Location: [place];  Service: Thoracic;  Laterality: Left;  . TONSILLECTOMY      Current Outpatient Medications  Medication Sig Dispense Refill  . azithromycin (ZITHROMAX) 500 MG tablet Take 1 tablet (500 mg total) by mouth daily. 30 tablet 11  . ethambutol (MYAMBUTOL) 400 MG tablet Take 5 tablets (2,000 mg total) by mouth daily. 150 tablet 11  . GENVOYA 150-150-200-10 MG TABS tablet TAKE 1 TABLET BY MOUTH DAILY 30 tablet 4  . hydrochlorothiazide (MICROZIDE) 12.5 MG capsule Take 1 capsule (12.5 mg total) by mouth daily. 30 capsule 0  . PREZISTA 800 MG tablet TAKE 1 TABLET(800 MG) BY MOUTH DAILY 30 tablet 4   No current facility-administered medications for this visit.     Social History   Socioeconomic History  . Marital status: Single    Spouse name: Not on file  . Number of children: Not on file  . Years of education: Not on file  . Highest education level: Not on file  Occupational History  . Not on file  Social Needs  . Financial resource strain: Not on file  . Food insecurity:    Worry: Not on file    Inability: Not on file  . Transportation needs:    Medical: Not on file    Non-medical: Not on file  Tobacco Use  . Smoking status: Never Smoker  . Smokeless tobacco: Never Used  Substance and Sexual Activity  . Alcohol use: Yes    Comment: socially  . Drug use: No  . Sexual activity: Not on file    Comment: declined condoms  Lifestyle  . Physical activity:    Days per week: Not on file    Minutes per session: Not on file  . Stress: Not on file  Relationships  . Social connections:    Talks on phone: Not on file    Gets together: Not on file    Attends religious service: Not on file    Active member of club or organization: Not on file    Attends meetings of clubs or organizations: Not on file    Relationship status: Not on file  . Intimate partner violence:    Fear of  current or ex partner: Not on file    Emotionally abused: Not on file    Physically abused: Not on file    Forced sexual activity: Not on file  Other Topics Concern  . Not on file  Social History Narrative  . Not on file    Family History  Problem Relation Age of Onset  . Anesthesia problems Neg Hx   . Hypotension Neg Hx   . Malignant hyperthermia Neg Hx   . Pseudochol deficiency Neg Hx       Marti Sleigh, MD 05/14/2018, 10:42 AM

## 2018-05-18 ENCOUNTER — Other Ambulatory Visit: Payer: Self-pay | Admitting: Internal Medicine

## 2018-05-18 DIAGNOSIS — B20 Human immunodeficiency virus [HIV] disease: Secondary | ICD-10-CM

## 2018-05-30 ENCOUNTER — Other Ambulatory Visit: Payer: BLUE CROSS/BLUE SHIELD

## 2018-05-30 DIAGNOSIS — B20 Human immunodeficiency virus [HIV] disease: Secondary | ICD-10-CM | POA: Diagnosis not present

## 2018-05-31 ENCOUNTER — Ambulatory Visit (HOSPITAL_COMMUNITY)
Admission: RE | Admit: 2018-05-31 | Discharge: 2018-05-31 | Disposition: A | Discharge status: Home / Self Care | Payer: BLUE CROSS/BLUE SHIELD | Source: Ambulatory Visit | Attending: Family | Admitting: Family

## 2018-05-31 ENCOUNTER — Encounter: Payer: Self-pay | Admitting: Vascular Surgery

## 2018-05-31 ENCOUNTER — Other Ambulatory Visit: Payer: Self-pay

## 2018-05-31 ENCOUNTER — Ambulatory Visit (INDEPENDENT_AMBULATORY_CARE_PROVIDER_SITE_OTHER): Payer: No Typology Code available for payment source | Admitting: Vascular Surgery

## 2018-05-31 VITALS — BP 137/96 | HR 84 | Temp 98.0°F | Resp 20 | Ht 70.0 in | Wt 299.0 lb

## 2018-05-31 DIAGNOSIS — I87001 Postthrombotic syndrome without complications of right lower extremity: Secondary | ICD-10-CM | POA: Diagnosis not present

## 2018-05-31 DIAGNOSIS — I82401 Acute embolism and thrombosis of unspecified deep veins of right lower extremity: Secondary | ICD-10-CM | POA: Diagnosis not present

## 2018-05-31 LAB — T-HELPER CELL (CD4) - (RCID CLINIC ONLY)
CD4 % Helper T Cell: 25 % — ABNORMAL LOW (ref 33–55)
CD4 T CELL ABS: 460 /uL (ref 400–2700)

## 2018-05-31 NOTE — Progress Notes (Signed)
Patient ID: [name], female   DOB: 1976-03-06, 43 y.o.   MRN: [id number]  Reason for Consult: New Patient (Initial Visit) (PE/DVT)   Referred by Jonathon Jordan, MD  Subjective:     HPI:  [name] is a 43 y.o. female had an occupational injury last summer ended up with a cast on her right lower extremity.  She later had a DVT in her right lower extremity was diagnosed with DVT in her right popliteal and peroneal veins.  This was associated with pulmonary embolus.  She was placed on Lovenox bridge to Coumadin for 6 months which she completed in December.  She has persistent swelling of the right lower extremity.  States that it really does not get much better with elevation but does note it worsens throughout the day.  She has not had any ulceration.  Prior to this did not have any personal or family history of DVT.  Was considered provoked secondary to casting and trauma and has not had any hypercoagulable work-up.  Past Medical History:  Diagnosis Date  . Anxiety   . Candidal esophagitis ([place])   . Complication of anesthesia 07/2011   in PACU took long time for oxygen sts to come up,2008 tonsillectomy went to ICU  . Family history of adverse reaction to anesthesia    mother H/O n/v  . Fracture of right ankle, lateral malleolus   . HIV (human immunodeficiency virus infection) ([place])   . MAC (mycobacterium avium-intracellulare complex)    positive culture, treated for 3 months  . Pulmonary embolism (H[facility])    Family History  Problem Relation Age of Onset  . Anesthesia problems Neg Hx   . Hypotension Neg Hx   . Malignant hyperthermia Neg Hx   . Pseudochol deficiency Neg Hx    Past Surgical History:  Procedure Laterality Date  . ANKLE ARTHROSCOPY WITH OPEN REDUCTION INTERNAL FIXATION (ORIF) Right 10/31/2017  . ANTERIOR CRUCIATE LIGAMENT REPAIR     Right and left repair  . LYMPH NODE BIOPSY Left 11/14/2016   Procedure: Left Cervical node I/D (incision and drain) and  Biopsy;  Surgeon: [name], MD;  Location: [facility];  Service: Thoracic;  Laterality: Left;  . NASAL RECONSTRUCTION  1982  . ORIF ANKLE FRACTURE Right 11/01/2017   Procedure: OPEN REDUCTION INTERNAL FIXATION (ORIF) right lateral malleolus fracture;  Surgeon: Wylene Simmer, MD;  Location: [facility];  Service: Orthopedics;  Laterality: Right;  . [place] NODE BIOPSY  07/28/2011   Procedure: SUPRACLAVICAL NODE BIOPSY;  Surgeon: Nicanor Alcon, MD;  Location: [facility];  Service: Thoracic;  Laterality: Left;  . TONSILLECTOMY      Short Social History:  Social History   Tobacco Use  . Smoking status: Never Smoker  . Smokeless tobacco: Never Used  Substance Use Topics  . Alcohol use: Yes    Comment: socially    Allergies  Allergen Reactions  . Bactrim [Sulfamethoxazole-Trimethoprim]     Pt states that the ATB is not effective.  She has developed a resistance.    Current Outpatient Medications  Medication Sig Dispense Refill  . ALPRAZolam (XANAX) 0.25 MG tablet TK 1 T PO QD PRN  0  . azithromycin (ZITHROMAX) 500 MG tablet Take 1 tablet (500 mg total) by mouth daily. 30 tablet 11  . escitalopram (LEXAPRO) 10 MG tablet TK SS T PO X 7 DAYS THEN 1 T PO QD  5  . ethambutol (MYAMBUTOL) 400 MG tablet Take 5 tablets (  2,000 mg total) by mouth daily. 150 tablet 11  . GENVOYA 150-150-200-10 MG TABS tablet TAKE 1 TABLET BY MOUTH DAILY 30 tablet 4  . hydrochlorothiazide (MICROZIDE) 12.5 MG capsule Take 1 capsule (12.5 mg total) by mouth daily. 30 capsule 0  . PREZISTA 800 MG tablet TAKE 1 TABLET BY MOUTH EVERY DAY 30 tablet 4   No current facility-administered medications for this visit.     Review of Systems  Constitutional:  Constitutional negative. HENT: HENT negative.  Eyes: Eyes negative.  Cardiovascular: Positive for leg swelling.  GI: Gastrointestinal negative.  Musculoskeletal: Positive for joint pain.  Skin: Skin negative.  Neurological: Neurological  negative. Hematologic: Hematologic/lymphatic negative.  Psychiatric: Psychiatric negative.        Objective:   Physical Exam HENT:     Head: Normocephalic.     Nose: Nose normal.  Eyes:     Pupils: Pupils are equal, round, and reactive to light.  Cardiovascular:     Rate and Rhythm: Normal rate.  Pulmonary:     Effort: Pulmonary effort is normal.  Abdominal:     General: Abdomen is flat.     Palpations: Abdomen is soft.  Musculoskeletal: Normal range of motion.        General: Swelling present.     Comments: Nonpitting right lower extremity edema  Skin:    General: Skin is warm and dry.     Capillary Refill: Capillary refill takes less than 2 seconds.  Neurological:     General: No focal deficit present.     Mental Status: She is alert.  Psychiatric:        Mood and Affect: Mood normal.        Behavior: Behavior normal.        Thought Content: Thought content normal.        Judgment: Judgment normal.     Data: I have independently interpreted her lower extremity venous study which demonstrates no evidence of DVT and no evidence of superficial venous thrombosis with previous popliteal and peroneal vein thrombus not evident on today's study.     Assessment/Plan:     43 year old female presents for evaluation of right lower extremity swelling after DVT with classic post thrombotic syndrome as we would expect after popliteal DVT.  She has been wearing knee-high compression stockings that are mild only but she does wear them religiously.  I have urged her to get more pairs of these continue to wear them.  I have also recommended 81 mg aspirin as the only medical measure to prevent recurrent DVT.  She should elevate her legs when recumbent and walk as much as tolerated.  Given the amount of swelling that she does have with patent deep veins we will get a reflux study for further evaluation in the near future.  I discussed with her that this will be a lifelong issue and that her  right lower extremity may never be back to normal but given her young age hopefully she can return to full activity.     Waynetta Sandy MD [facility]

## 2018-06-03 ENCOUNTER — Telehealth: Payer: Self-pay

## 2018-06-03 LAB — HIV-1 RNA QUANT-NO REFLEX-BLD
HIV 1 RNA QUANT: NOT DETECTED {copies}/mL
HIV-1 RNA QUANT, LOG: NOT DETECTED {Log_copies}/mL

## 2018-06-03 NOTE — Telephone Encounter (Signed)
Ms Orser states that she received a charge for a colposcopy procedure at visit with Dr. Fermin Schwab on 05-14-18.  The patient states that this procedure was not performed. In the office note from 05-14-18, Dr. Fermin Schwab was unable for perform colposcopy due to large uterine fibroids deviating the cervix. Told Ms Pendergrass that this information was related to a coder at the [facility] [phone]) The charge was reversed by Adela Lank today and notes placed in with the reversal. Adela Lank suggests that Ms Bayliss not pay anything on the current bill and let it cycle through again as the balance due will be less then current statement.

## 2018-06-04 ENCOUNTER — Encounter: Payer: Self-pay | Admitting: Internal Medicine

## 2018-06-04 ENCOUNTER — Ambulatory Visit: Payer: BLUE CROSS/BLUE SHIELD | Admitting: Internal Medicine

## 2018-06-04 VITALS — BP 123/83 | HR 82 | Temp 97.9°F | Ht 70.0 in | Wt 299.8 lb

## 2018-06-04 DIAGNOSIS — Z23 Encounter for immunization: Secondary | ICD-10-CM

## 2018-06-04 DIAGNOSIS — Z5181 Encounter for therapeutic drug level monitoring: Secondary | ICD-10-CM

## 2018-06-04 DIAGNOSIS — R599 Enlarged lymph nodes, unspecified: Secondary | ICD-10-CM

## 2018-06-04 DIAGNOSIS — B20 Human immunodeficiency virus [HIV] disease: Secondary | ICD-10-CM

## 2018-06-04 DIAGNOSIS — I2699 Other pulmonary embolism without acute cor pulmonale: Secondary | ICD-10-CM

## 2018-06-04 DIAGNOSIS — H401131 Primary open-angle glaucoma, bilateral, mild stage: Secondary | ICD-10-CM | POA: Diagnosis not present

## 2018-06-04 NOTE — Assessment & Plan Note (Signed)
Doing well from this standpoint.  No changes.  rtc 3 months

## 2018-06-04 NOTE — Progress Notes (Signed)
   Subjective:    Patient ID: [name], female    DOB: 05-Nov-1975, 43 y.o.   MRN: [id number]  HPI She is here for follow-up of HIV and granulomatous adenopathy. She continues on Genvoya with Prezista for her salvage regimen.  No issues taking that her CD4 count is 460 with a viral load <20.  No missed doses or new issues.  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  This is a recurrent problem and she has continued drainage from the area though it has decreased some.  This now is dried up and no new issues.   Also she did have a history of a pulmonary embolus and now is off of her Coumadin therapy, followed by Dr. Donzetta Matters for potential lower extremity reflux.   Review of Systems  Constitutional: Negative for fatigue and fever.  Skin: Negative for rash.       Objective:   Physical Exam Constitutional:      Appearance: She is well-developed.  Eyes:     General: No scleral icterus. Neck:     Comments: Previous open area now closed, no erythema, no drainage Cardiovascular:     Rate and Rhythm: Normal rate and regular rhythm.     Heart sounds: Normal heart sounds.  Pulmonary:     Effort: Pulmonary effort is normal. No respiratory distress.     Breath sounds: Normal breath sounds.  Skin:    Findings: No rash.    SH: no tobacco       Assessment & Plan:

## 2018-06-04 NOTE — Assessment & Plan Note (Signed)
Will complete Menveo #2

## 2018-06-04 NOTE — Assessment & Plan Note (Signed)
Now off of coumadin

## 2018-06-04 NOTE — Assessment & Plan Note (Signed)
I will have her continue the 3 drug regimen for another 3 months.

## 2018-06-04 NOTE — Assessment & Plan Note (Signed)
No issues with her medications.  No vision issues on ethambutol.

## 2018-06-04 NOTE — Addendum Note (Signed)
Addended by: Wendee Beavers on: 06/04/2018 10:15 AM   Modules accepted: Orders

## 2018-06-13 ENCOUNTER — Encounter: Payer: Self-pay | Admitting: Internal Medicine

## 2018-06-27 ENCOUNTER — Other Ambulatory Visit: Payer: Self-pay | Admitting: *Deleted

## 2018-06-27 ENCOUNTER — Telehealth: Payer: Self-pay | Admitting: *Deleted

## 2018-06-27 DIAGNOSIS — A310 Pulmonary mycobacterial infection: Secondary | ICD-10-CM

## 2018-06-27 MED ORDER — AZITHROMYCIN 500 MG PO TABS: 500.0000 mg | ORAL_TABLET | Freq: Every day | ORAL | 0 refills | Status: DC

## 2018-06-27 MED ORDER — ETHAMBUTOL HCL 400 MG PO TABS: 2000.0000 mg | ORAL_TABLET | Freq: Every day | ORAL | 0 refills | Status: DC

## 2018-06-27 NOTE — Telephone Encounter (Signed)
Patient left voicemail in triage asking for Dr Linus Salmons to send 90 day supply of her ethambutol and azithromycin to her pharmacy today. She states she will lose insurance at the end of the month. RN sent refills to [facility] for 90 day supply.  Will 'cc [facility] for HIV assistance. Landis Gandy, RN

## 2018-06-28 NOTE — Telephone Encounter (Signed)
I spoke with [name] about losing her insurance at the end of March.  She is currently on Genvoya.   I scheduled her with [facility] financial counselors for Monday at 10:00am.  Once she meets with Juliann Pulse or Timmothy Sours, they can determine if pharmacy needs to get involved with temporary medication assistance and can direct her to [place] and we will assist with her application through [place].  [name]. [name] Patient [facility] Phone: [phone] Fax:  [phone]

## 2018-07-01 ENCOUNTER — Ambulatory Visit: Payer: BLUE CROSS/BLUE SHIELD

## 2018-07-01 ENCOUNTER — Other Ambulatory Visit: Payer: Self-pay

## 2018-07-05 ENCOUNTER — Ambulatory Visit (HOSPITAL_COMMUNITY): Payer: BLUE CROSS/BLUE SHIELD

## 2018-07-05 ENCOUNTER — Ambulatory Visit: Payer: Self-pay | Admitting: Vascular Surgery

## 2018-07-22 ENCOUNTER — Encounter: Payer: Self-pay | Admitting: Internal Medicine

## 2018-08-05 ENCOUNTER — Other Ambulatory Visit: Payer: Self-pay | Admitting: Behavioral Health

## 2018-08-05 DIAGNOSIS — A310 Pulmonary mycobacterial infection: Secondary | ICD-10-CM

## 2018-08-05 MED ORDER — AZITHROMYCIN 500 MG PO TABS: 500.0000 mg | ORAL_TABLET | Freq: Every day | ORAL | 1 refills | Status: AC

## 2018-08-05 MED ORDER — ETHAMBUTOL HCL 400 MG PO TABS: 2000.0000 mg | ORAL_TABLET | Freq: Every day | ORAL | 1 refills | Status: AC

## 2018-08-20 ENCOUNTER — Other Ambulatory Visit: Payer: Self-pay

## 2018-08-20 DIAGNOSIS — B20 Human immunodeficiency virus [HIV] disease: Secondary | ICD-10-CM

## 2018-08-20 DIAGNOSIS — Z113 Encounter for screening for infections with a predominantly sexual mode of transmission: Secondary | ICD-10-CM

## 2018-08-21 LAB — T-HELPER CELL (CD4) - (RCID CLINIC ONLY)
CD4 % Helper T Cell: 26 % — ABNORMAL LOW (ref 33–65)
CD4 T Cell Abs: 478 /uL (ref 400–1790)

## 2018-08-21 LAB — URINE CYTOLOGY ANCILLARY ONLY
Chlamydia: NEGATIVE
Neisseria Gonorrhea: NEGATIVE

## 2018-08-24 LAB — COMPLETE METABOLIC PANEL WITH GFR
AG Ratio: 1.6 (calc) (ref 1.0–2.5)
ALT: 11 U/L (ref 6–29)
AST: 13 U/L (ref 10–30)
Albumin: 3.9 g/dL (ref 3.6–5.1)
Alkaline phosphatase (APISO): 68 U/L (ref 31–125)
BUN: 11 mg/dL (ref 7–25)
CO2: 28 mmol/L (ref 20–32)
Calcium: 9 mg/dL (ref 8.6–10.2)
Chloride: 103 mmol/L (ref 98–110)
Creat: 0.91 mg/dL (ref 0.50–1.10)
GFR, Est African American: 91 mL/min/{1.73_m2} (ref >=60)
GFR, Est Non African American: 78 mL/min/{1.73_m2} (ref >=60)
Globulin: 2.4 g/dL (calc) (ref 1.9–3.7)
Glucose, Bld: 100 mg/dL — ABNORMAL HIGH (ref 65–99)
Potassium: 4.6 mmol/L (ref 3.5–5.3)
Sodium: 136 mmol/L (ref 135–146)
Total Bilirubin: 0.3 mg/dL (ref 0.2–1.2)
Total Protein: 6.3 g/dL (ref 6.1–8.1)

## 2018-08-24 LAB — HIV-1 RNA QUANT-NO REFLEX-BLD
HIV 1 RNA Quant: <20 copies/mL — AB
HIV-1 RNA Quant, Log: <1.3 Log copies/mL — AB

## 2018-08-28 ENCOUNTER — Emergency Department (HOSPITAL_COMMUNITY): Payer: Self-pay

## 2018-08-28 ENCOUNTER — Emergency Department (HOSPITAL_COMMUNITY)
Admission: EM | Admit: 2018-08-28 | Discharge: 2018-08-29 | Disposition: A | Discharge status: Home / Self Care | Payer: Self-pay | Attending: Emergency Medicine | Admitting: Emergency Medicine

## 2018-08-28 ENCOUNTER — Other Ambulatory Visit: Payer: Self-pay

## 2018-08-28 DIAGNOSIS — Z79899 Other long term (current) drug therapy: Secondary | ICD-10-CM | POA: Insufficient documentation

## 2018-08-28 DIAGNOSIS — Z21 Asymptomatic human immunodeficiency virus [HIV] infection status: Secondary | ICD-10-CM | POA: Insufficient documentation

## 2018-08-28 DIAGNOSIS — R0602 Shortness of breath: Secondary | ICD-10-CM | POA: Insufficient documentation

## 2018-08-28 DIAGNOSIS — R079 Chest pain, unspecified: Secondary | ICD-10-CM

## 2018-08-28 DIAGNOSIS — R0789 Other chest pain: Secondary | ICD-10-CM | POA: Insufficient documentation

## 2018-08-28 LAB — CBC WITH DIFFERENTIAL/PLATELET
Abs Immature Granulocytes: 0.01 10*3/uL (ref 0.00–0.07)
Basophils Absolute: 0.1 10*3/uL (ref 0.0–0.1)
Basophils Relative: 1 %
Eosinophils Absolute: 0.4 10*3/uL (ref 0.0–0.5)
Eosinophils Relative: 4 %
HCT: 42.6 % (ref 36.0–46.0)
Hemoglobin: 14 g/dL (ref 12.0–15.0)
Immature Granulocytes: 0 %
Lymphocytes Relative: 31 %
Lymphs Abs: 3 10*3/uL (ref 0.7–4.0)
MCH: 30.4 pg (ref 26.0–34.0)
MCHC: 32.9 g/dL (ref 30.0–36.0)
MCV: 92.6 fL (ref 80.0–100.0)
Monocytes Absolute: 0.8 10*3/uL (ref 0.1–1.0)
Monocytes Relative: 8 %
Neutro Abs: 5.4 10*3/uL (ref 1.7–7.7)
Neutrophils Relative %: 56 %
Platelets: 283 10*3/uL (ref 150–400)
RBC: 4.6 MIL/uL (ref 3.87–5.11)
RDW: 13.9 % (ref 11.5–15.5)
WBC: 9.7 10*3/uL (ref 4.0–10.5)
nRBC: 0 % (ref 0.0–0.2)

## 2018-08-28 NOTE — ED Triage Notes (Signed)
Pt reports with right sided chest pain that is sharp and nagging with times of tightness. Hx of PE in July of last year. No thinners currently. Denies SOB. Says it feels similar to PE

## 2018-08-28 NOTE — ED Provider Notes (Signed)
[facility] DEPT Provider Note   CSN: [id number] Arrival date & time: 08/28/18  2158    History   Chief Complaint Chief Complaint  Patient presents with   Chest Pain    HPI [name] is a 43 y.o. female.     Patient is a 43 year old female with past medical history of HIV disease and pulmonary embolism following ankle surgery in 2019.  She presents today with complaints of discomfort to the right side of her chest.  This started several days ago and is worsening.  She describes a sharp sensation just to the right of her sternum.  She denies any fevers or chills.  She denies any cough or shortness of breath.  This feels different than what she experienced with her PE last year.  The history is provided by the patient.  Chest Pain  Pain location:  R chest Pain quality: sharp   Pain radiates to:  Does not radiate Pain severity:  Moderate Onset quality:  Gradual Timing:  Constant Progression:  Worsening Chronicity:  New Relieved by:  Nothing Worsened by:  Deep breathing and movement   Past Medical History:  Diagnosis Date   Anxiety    Candidal esophagitis (HCC)    Complication of anesthesia 07/2011   in PACU took long time for oxygen sts to come up,2008 tonsillectomy went to ICU   Family history of adverse reaction to anesthesia    mother H/O n/v   Fracture of right ankle, lateral malleolus    HIV (human immunodeficiency virus infection) ([place])    MAC (mycobacterium avium-intracellulare complex)    positive culture, treated for 3 months   Pulmonary embolism [facility])     Patient Active Problem List   Diagnosis Date Noted   Medication monitoring encounter 03/25/2018   Need for vaccination for meningococcus 12/13/2017   Right leg DVT ([place]) 11/11/2017   Bilateral pulmonary embolism ([place]) 11/10/2017   Granulomatous adenopathy 12/21/2016   Fistula, skin 10/03/2016   Screening examination for venereal disease 08/06/2013     Encounter for long-term (current) use of high-risk medication 08/06/2013   HIV disease ([place]) 12/29/2010   Anxiety 12/29/2010    Past Surgical History:  Procedure Laterality Date   ANKLE ARTHROSCOPY WITH OPEN REDUCTION INTERNAL FIXATION (ORIF) Right 10/31/2017   ANTERIOR CRUCIATE LIGAMENT REPAIR     Right and left repair   LYMPH NODE BIOPSY Left 11/14/2016   Procedure: Left Cervical node I/D (incision and drain) and Biopsy;  Surgeon: [name], MD;  Location: [facility];  Service: Thoracic;  Laterality: Left;   NASAL RECONSTRUCTION  1982   ORIF ANKLE FRACTURE Right 11/01/2017   Procedure: OPEN REDUCTION INTERNAL FIXATION (ORIF) right lateral malleolus fracture;  Surgeon: Wylene Simmer, MD;  Location: [place];  Service: Orthopedics;  Laterality: Right;   [place] BIOPSY  07/28/2011   Procedure: SUPRACLAVICAL NODE BIOPSY;  Surgeon: Nicanor Alcon, MD;  Location: [facility];  Service: Thoracic;  Laterality: Left;   TONSILLECTOMY       OB History   No obstetric history on file.      Home Medications    Prior to Admission medications   Medication Sig Start Date End Date Taking? Authorizing Provider  ALPRAZolam (XANAX) 0.25 MG tablet TK 1 T PO QD PRN 12/20/17   [provider]  azithromycin (ZITHROMAX) 500 MG tablet Take 1 tablet (500 mg total) by mouth daily for 30 days. 08/05/18 09/04/18  Thayer Headings, MD  escitalopram (LEXAPRO) 10 MG tablet TK SS T PO X 7 DAYS THEN 1 T PO QD 01/19/18   [provider]  ethambutol (MYAMBUTOL) 400 MG tablet Take 5 tablets (2,000 mg total) by mouth daily for 30 days. 08/05/18 09/04/18  Thayer Headings, MD  GENVOYA 150-150-200-10 MG TABS tablet TAKE 1 TABLET BY MOUTH DAILY 05/20/18   Comer, Okey Regal, MD  hydrochlorothiazide (MICROZIDE) 12.5 MG capsule Take 1 capsule (12.5 mg total) by mouth daily. 11/13/17   Mariel Aloe, MD  PREZISTA 800 MG tablet TAKE 1 TABLET BY MOUTH EVERY DAY 05/20/18   Comer, Okey Regal, MD    Family History Family History  Problem Relation Age of Onset   Anesthesia problems Neg Hx    Hypotension Neg Hx    Malignant hyperthermia Neg Hx    Pseudochol deficiency Neg Hx     Social History Social History   Tobacco Use   Smoking status: Never Smoker   Smokeless tobacco: Never Used  Substance Use Topics   Alcohol use: Yes    Comment: socially   Drug use: No     Allergies   Bactrim [sulfamethoxazole-trimethoprim]   Review of Systems Review of Systems  Cardiovascular: Positive for chest pain.  All other systems reviewed and are negative.    Physical Exam Updated Vital Signs BP (!) 149/93 (BP Location: Right Arm)    Pulse 83    Temp 98.6 F (37 C) (Oral)    Resp 19    Ht _0  (1.778 m)    Wt 135.2 kg    LMP 08/27/2018    SpO2 98%    BMI 42.76 kg/m   Physical Exam Vitals signs and nursing note reviewed.  Constitutional:      General: She is not in acute distress.    Appearance: She is well-developed. She is not diaphoretic.  HENT:     Head: Normocephalic and atraumatic.  Neck:     Musculoskeletal: Normal range of motion and neck supple.  Cardiovascular:     Rate and Rhythm: Normal rate and regular rhythm.     Heart sounds: No murmur. No friction rub. No gallop.   Pulmonary:     Effort: Pulmonary effort is normal. No respiratory distress.     Breath sounds: Normal breath sounds. No wheezing.  Abdominal:     General: Bowel sounds are normal. There is no distension.     Palpations: Abdomen is soft.     Tenderness: There is no abdominal tenderness.  Musculoskeletal: Normal range of motion.     Right lower leg: She exhibits no tenderness. No edema.     Left lower leg: She exhibits no tenderness. No edema.     Comments: There is no calf tenderness and Homans sign is absent bilaterally.  Skin:    General: Skin is warm and dry.  Neurological:     Mental Status: She is alert and oriented to person, place, and time.      ED Treatments  / Results  Labs (all labs ordered are listed, but only abnormal results are displayed) Labs Reviewed  CBC WITH DIFFERENTIAL/PLATELET  BASIC METABOLIC PANEL  TROPONIN I    EKG EKG Interpretation  Date/Time:  Wednesday Aug 28 2018 22:19:46 EDT Ventricular Rate:  84 PR Interval:    QRS Duration: 90 QT Interval:  364 QTC Calculation: 431 R Axis:   50 Text Interpretation:  Sinus rhythm Normal ECG Unchanged from 06/30/2018 Confirmed by Veryl Speak [phone]) on 08/28/2018  11:30:11 PM   Radiology Dg Chest 2 View  Result Date: 08/28/2018 CLINICAL DATA:  Chest pain EXAM: CHEST - 2 VIEW COMPARISON:  Chest CT 11/10/2017 FINDINGS: The heart size and mediastinal contours are within normal limits. Both lungs are clear. The visualized skeletal structures are unremarkable. IMPRESSION: Clear lungs. Electronically Signed   By: Ulyses Jarred M.D.   On: 08/28/2018 23:18    Procedures Procedures (including critical care time)  Medications Ordered in ED Medications - No data to display   Initial Impression / Assessment and Plan / ED Course  I have reviewed the triage vital signs and the nursing notes.  Pertinent labs & imaging results that were available during my care of the patient were reviewed by me and considered in my medical decision making (see chart for details).  Patient presenting with complaints of chest pain worsening over the past few days.  EKG and troponin are negative and chest x-ray is clear.  CT scan of the chest shows no evidence for pulmonary embolism.  Patient will be discharged with anti-inflammatory medications for suspected chest wall pain.  She is to follow-up with cardiology in the near future to discuss a stress test if symptoms do not improve.  Final Clinical Impressions(s) / ED Diagnoses   Final diagnoses:  None    ED Discharge Orders    None       Veryl Speak, MD 08/29/18 [phone]

## 2018-08-29 ENCOUNTER — Encounter (HOSPITAL_COMMUNITY): Payer: Self-pay

## 2018-08-29 ENCOUNTER — Emergency Department (HOSPITAL_COMMUNITY): Payer: Self-pay

## 2018-08-29 LAB — BASIC METABOLIC PANEL
Anion gap: 9 (ref 5–15)
BUN: 15 mg/dL (ref 6–20)
CO2: 26 mmol/L (ref 22–32)
Calcium: 9.2 mg/dL (ref 8.9–10.3)
Chloride: 104 mmol/L (ref 98–111)
Creatinine, Ser: 1.03 mg/dL — ABNORMAL HIGH (ref 0.44–1.00)
GFR calc Af Amer: >60 mL/min (ref >60)
GFR calc non Af Amer: >60 mL/min (ref >60)
Glucose, Bld: 102 mg/dL — ABNORMAL HIGH (ref 70–99)
Potassium: 3.7 mmol/L (ref 3.5–5.1)
Sodium: 139 mmol/L (ref 135–145)

## 2018-08-29 LAB — TROPONIN I: Troponin I: <0.03 ng/mL (ref <0.03)

## 2018-08-29 MED ORDER — IOHEXOL 350 MG/ML SOLN
100.0000 mL | Freq: Once | INTRAVENOUS | Status: AC | PRN
  Administered 2018-08-29: 100 mL via INTRAVENOUS

## 2018-08-29 MED ORDER — SODIUM CHLORIDE (PF) 0.9 % IJ SOLN
INTRAMUSCULAR | Status: AC
  Filled 2018-08-29: qty 50

## 2018-08-29 NOTE — Discharge Instructions (Addendum)
Ibuprofen 600 mg every six hours as needed for pain.  Return to the Emergency Department if you develop worsening pain, high fever, difficulty breathing, or other new and concerning symptoms.

## 2018-09-05 ENCOUNTER — Ambulatory Visit (INDEPENDENT_AMBULATORY_CARE_PROVIDER_SITE_OTHER): Payer: Self-pay | Admitting: Internal Medicine

## 2018-09-05 ENCOUNTER — Encounter: Payer: Self-pay | Admitting: Internal Medicine

## 2018-09-05 ENCOUNTER — Other Ambulatory Visit: Payer: Self-pay

## 2018-09-05 DIAGNOSIS — B20 Human immunodeficiency virus [HIV] disease: Secondary | ICD-10-CM

## 2018-09-05 DIAGNOSIS — Z5181 Encounter for therapeutic drug level monitoring: Secondary | ICD-10-CM

## 2018-09-05 DIAGNOSIS — R599 Enlarged lymph nodes, unspecified: Secondary | ICD-10-CM

## 2018-09-05 NOTE — Assessment & Plan Note (Signed)
Doing well with this, no issues.  rtc 6 months.

## 2018-09-05 NOTE — Progress Notes (Signed)
   Subjective:    Patient ID: DRAKE LANDING, female    DOB: Aug 03, 1975, 43 y.o.   MRN: [id number]  HPI I connected with Melinda Crutch, 09/05/2018. I verified that I was speaking with the correct person using two identifiers. Due to the COVID-19 Pandemic, this service was provided via telemedicine using audio/visual media.   The patient was located at home. The provider was located in the office. The patient did consent to this visit and is aware of charges through their insurance as well as the limitations of evaluation and management by telemedicine. Other persons participating in this telemedicine service were none. Time spent on visit was 23 minutes on media and in coordination of care  She is connected for follow up of HIV and granulomatous lymphadenopathy.  She has recently been in the ED for chest pain but no concerns found, including a negative CTA for PE.  She continues on Genvoya and prezista + azithromycin and ethambutol for the lymphadenopathy with her history of MAI infection.  No associated fever or chills.  No concerns with her medications.      Review of Systems  Constitutional: Negative for chills and fever.  Gastrointestinal: Negative for diarrhea and nausea.  Skin: Negative for rash.  Hematological: Negative for adenopathy.       Objective:   Physical Exam        Assessment & Plan:

## 2018-09-05 NOTE — Assessment & Plan Note (Signed)
Resolved again.  I will have her complete her MAI therapy with what she has and stop.

## 2018-09-05 NOTE — Assessment & Plan Note (Signed)
Creat minimally elevated during recent ED visit.

## 2018-09-11 ENCOUNTER — Other Ambulatory Visit: Payer: Self-pay | Admitting: Family Medicine

## 2018-09-11 ENCOUNTER — Other Ambulatory Visit: Payer: Self-pay

## 2018-09-11 ENCOUNTER — Other Ambulatory Visit (HOSPITAL_COMMUNITY): Payer: Self-pay | Admitting: Family Medicine

## 2018-09-11 DIAGNOSIS — I82401 Acute embolism and thrombosis of unspecified deep veins of right lower extremity: Secondary | ICD-10-CM

## 2018-09-11 DIAGNOSIS — R0602 Shortness of breath: Secondary | ICD-10-CM

## 2018-09-12 ENCOUNTER — Telehealth (HOSPITAL_COMMUNITY): Payer: Self-pay

## 2018-09-12 NOTE — Telephone Encounter (Signed)
The above patient or their representative was contacted and gave the following answers to these questions:         Do you have any of the following symptoms?  NO  Fever                    Cough                   Shortness of breath  Do  you have any of the following other symptoms?    muscle pain         vomiting,        diarrhea        rash         weakness        red eye        abdominal pain         bruising          bruising or bleeding              joint pain           severe headache    Have you been in contact with someone who was or has been sick in the past 2 weeks?  NO  Yes                 Unsure                         Unable to assess   Does the person that you were in contact with have any of the following symptoms?   Cough         shortness of breath           muscle pain         vomiting,            diarrhea            rash            weakness           fever            red eye           abdominal pain           bruising  or  bleeding                joint pain                severe headache               Have you  or someone you have been in contact with traveled internationally in th last month?  NO       If yes, which countries?   Have you  or someone you have been in contact with traveled outside [place] in th last month?   NO      If yes, which state and city?   COMMENTS OR ACTION PLAN FOR THIS PATIENT:         

## 2018-09-13 ENCOUNTER — Ambulatory Visit (HOSPITAL_COMMUNITY)
Admission: RE | Admit: 2018-09-13 | Discharge: 2018-09-13 | Disposition: A | Discharge status: Home / Self Care | Payer: No Typology Code available for payment source | Source: Ambulatory Visit | Attending: Vascular Surgery | Admitting: Vascular Surgery

## 2018-09-13 ENCOUNTER — Other Ambulatory Visit: Payer: Self-pay

## 2018-09-13 ENCOUNTER — Ambulatory Visit (INDEPENDENT_AMBULATORY_CARE_PROVIDER_SITE_OTHER): Payer: No Typology Code available for payment source | Admitting: Vascular Surgery

## 2018-09-13 ENCOUNTER — Encounter: Payer: Self-pay | Admitting: Vascular Surgery

## 2018-09-13 VITALS — BP 119/88 | HR 82 | Temp 98.0°F | Resp 20 | Ht 70.0 in | Wt 305.0 lb

## 2018-09-13 DIAGNOSIS — I87001 Postthrombotic syndrome without complications of right lower extremity: Secondary | ICD-10-CM | POA: Diagnosis not present

## 2018-09-13 DIAGNOSIS — I82401 Acute embolism and thrombosis of unspecified deep veins of right lower extremity: Secondary | ICD-10-CM | POA: Diagnosis not present

## 2018-09-13 NOTE — Progress Notes (Signed)
Patient ID: [name], female   DOB: 02-24-1976, 43 y.o.   MRN: [id number]  Reason for Consult: No chief complaint on file.   Referred by Jonathon Jordan, MD  Subjective:     HPI:  [name] is a 43 y.o. female whom I previously saw for DVT in her right lower extremity complicated by pulmonary embolus.  This happened after occupational injury.  Initially took Lovenox bridge to Coumadin.  Has persistent swelling right lower extremity.  Does not have any previous personal or family history of DVT.  DVT has been considered provoked secondary to casting from trauma.  Patient has not had hypercoagulable work-up.  She did have a follow-up DVT ultrasound which demonstrated resolution of her clot.  Past Medical History:  Diagnosis Date  . Anxiety   . Candidal esophagitis ([place])   . Complication of anesthesia 07/2011   in PACU took long time for oxygen sts to come up,2008 tonsillectomy went to ICU  . Family history of adverse reaction to anesthesia    mother H/O n/v  . Fracture of right ankle, lateral malleolus   . HIV (human immunodeficiency virus infection) ([place])   . MAC (mycobacterium avium-intracellulare complex)    positive culture, treated for 3 months  . Pulmonary embolism (HCC)    Family History  Problem Relation Age of Onset  . Anesthesia problems Neg Hx   . Hypotension Neg Hx   . Malignant hyperthermia Neg Hx   . Pseudochol deficiency Neg Hx    Past Surgical History:  Procedure Laterality Date  . ANKLE ARTHROSCOPY WITH OPEN REDUCTION INTERNAL FIXATION (ORIF) Right 10/31/2017  . ANTERIOR CRUCIATE LIGAMENT REPAIR     Right and left repair  . LYMPH NODE BIOPSY Left 11/14/2016   Procedure: Left Cervical node I/D (incision and drain) and Biopsy;  Surgeon: [name], MD;  Location: [facility];  Service: Thoracic;  Laterality: Left;  . NASAL RECONSTRUCTION  1982  . ORIF ANKLE FRACTURE Right 11/01/2017   Procedure: OPEN REDUCTION INTERNAL FIXATION (ORIF) right  lateral malleolus fracture;  Surgeon: Wylene Simmer, MD;  Location: [facility];  Service: Orthopedics;  Laterality: Right;  . [place] NODE BIOPSY  07/28/2011   Procedure: SUPRACLAVICAL NODE BIOPSY;  Surgeon: Nicanor Alcon, MD;  Location: [facility];  Service: Thoracic;  Laterality: Left;  . TONSILLECTOMY      Short Social History:  Social History   Tobacco Use  . Smoking status: Never Smoker  . Smokeless tobacco: Never Used  Substance Use Topics  . Alcohol use: Yes    Comment: socially    Allergies  Allergen Reactions  . Bactrim [Sulfamethoxazole-Trimethoprim]     Pt states that the ATB is not effective.  She has developed a resistance.    Current Outpatient Medications  Medication Sig Dispense Refill  . ALPRAZolam (XANAX) 0.25 MG tablet TK 1 T PO QD PRN  0  . escitalopram (LEXAPRO) 10 MG tablet TK SS T PO X 7 DAYS THEN 1 T PO QD  5  . GENVOYA 150-150-200-10 MG TABS tablet TAKE 1 TABLET BY MOUTH DAILY 30 tablet 4  . hydrochlorothiazide (MICROZIDE) 12.5 MG capsule Take 1 capsule (12.5 mg total) by mouth daily. 30 capsule 0  . PREZISTA 800 MG tablet TAKE 1 TABLET BY MOUTH EVERY DAY 30 tablet 4   No current facility-administered medications for this visit.     Review of Systems  Constitutional:  Constitutional negative. HENT: HENT negative.  Eyes: Eyes negative.  Cardiovascular: Positive for leg swelling.  GI: Gastrointestinal negative.  Musculoskeletal: Musculoskeletal negative.  Skin: Skin negative.  Neurological: Neurological negative. Hematologic: Hematologic/lymphatic negative.  Psychiatric: Psychiatric negative.        Objective:   Vitals:   09/13/18 0952  BP: 119/88  Pulse: 82  Resp: 20  Temp: 98 F (36.7 C)  SpO2: 96%    Physical Exam HENT:     Mouth/Throat:     Mouth: Mucous membranes are moist.  Eyes:     Extraocular Movements: Extraocular movements intact.     Pupils: Pupils are equal, round, and reactive to light.  Neck:      Musculoskeletal: Normal range of motion and neck supple.  Cardiovascular:     Rate and Rhythm: Normal rate.     Pulses:          Radial pulses are 2+ on the right side and 2+ on the left side.       Dorsalis pedis pulses are 2+ on the right side and 2+ on the left side.       Posterior tibial pulses are 2+ on the right side and 2+ on the left side.  Pulmonary:     Effort: Pulmonary effort is normal.  Abdominal:     General: Abdomen is flat.     Palpations: Abdomen is soft.  Musculoskeletal:     Right lower leg: Edema present.  Skin:    General: Skin is warm and dry.     Capillary Refill: Capillary refill takes less than 2 seconds.  Neurological:     General: No focal deficit present.     Mental Status: She is alert.  Psychiatric:        Mood and Affect: Mood normal.        Behavior: Behavior normal.        Thought Content: Thought content normal.        Judgment: Judgment normal.     Data: I have independently interpreted her right lower extremity venous reflux study which demonstrates deep venous reflux.  No reflux noted in the superficial veins of the right lower extremity.  GSV at the junction 0.89 c.  Is out of the fascia distal in the thigh 0.44 cm.  Not visualized at the level knee     Assessment/Plan:    43 year old female had an injury to her right lower extremity while working for the [employer].  This was complicated by deep venous thrombosis saw her with persistent swelling and she had had a repeat DVT study which demonstrated no residual thrombus in her popliteal and peroneal veins.  She does wear gentle compression stockings.  She is hoping to get back to full-time activity soon.  She is taking aspirin but did have some bruising.  We have discussed taking aspirin maybe every other day but she will need this lifelong as her only medical prevention to DVT.  DVT has been considered provoked given that she was at the time of injury she has no personal or family  history of DVT.  From a vascular standpoint she does not need intervention based on her reflux study today.  I have discussed with her the need for walking, wearing compression stockings as well as weight loss.  We discussed that the swelling may be lifelong given that she has post thrombotic syndrome without any need for intervention.  She demonstrates very good understanding and can follow-up with me on an as-needed basis.   Christopher  MD [facility]  

## 2018-09-24 ENCOUNTER — Telehealth: Payer: Self-pay | Admitting: Vascular Surgery

## 2018-09-24 NOTE — Telephone Encounter (Signed)
[name] brought a Form 93Z "Employees Application for Insurance claims handler ([employer] 97-25.1)" and a letter from Wiliam Ke, Forensic psychologist at [employer].   The form has been referred to Dr. Donzetta Matters for completion of the treating physician statement.   [name]

## 2018-10-14 ENCOUNTER — Other Ambulatory Visit: Payer: Self-pay | Admitting: Internal Medicine

## 2018-10-14 DIAGNOSIS — B20 Human immunodeficiency virus [HIV] disease: Secondary | ICD-10-CM

## 2018-10-30 DIAGNOSIS — M545 Low back pain: Secondary | ICD-10-CM | POA: Diagnosis not present

## 2018-11-13 DIAGNOSIS — M940 Chondrocostal junction syndrome [Tietze]: Secondary | ICD-10-CM | POA: Diagnosis not present

## 2018-11-17 ENCOUNTER — Emergency Department (HOSPITAL_COMMUNITY): Payer: Medicaid Other

## 2018-11-17 ENCOUNTER — Emergency Department (HOSPITAL_COMMUNITY)
Admission: EM | Admit: 2018-11-17 | Discharge: 2018-11-17 | Disposition: A | Discharge status: Home / Self Care | Payer: Medicaid Other | Attending: Emergency Medicine | Admitting: Emergency Medicine

## 2018-11-17 ENCOUNTER — Other Ambulatory Visit: Payer: Self-pay

## 2018-11-17 ENCOUNTER — Encounter (HOSPITAL_COMMUNITY): Payer: Self-pay | Admitting: Emergency Medicine

## 2018-11-17 DIAGNOSIS — N1330 Unspecified hydronephrosis: Secondary | ICD-10-CM | POA: Diagnosis not present

## 2018-11-17 DIAGNOSIS — N23 Unspecified renal colic: Secondary | ICD-10-CM

## 2018-11-17 DIAGNOSIS — D259 Leiomyoma of uterus, unspecified: Secondary | ICD-10-CM | POA: Diagnosis not present

## 2018-11-17 DIAGNOSIS — Z79899 Other long term (current) drug therapy: Secondary | ICD-10-CM | POA: Insufficient documentation

## 2018-11-17 DIAGNOSIS — R1011 Right upper quadrant pain: Secondary | ICD-10-CM | POA: Diagnosis not present

## 2018-11-17 DIAGNOSIS — Z21 Asymptomatic human immunodeficiency virus [HIV] infection status: Secondary | ICD-10-CM | POA: Insufficient documentation

## 2018-11-17 LAB — COMPREHENSIVE METABOLIC PANEL
ALT: 20 U/L (ref 0–44)
AST: 21 U/L (ref 15–41)
Albumin: 4.4 g/dL (ref 3.5–5.0)
Alkaline Phosphatase: 78 U/L (ref 38–126)
Anion gap: 9 (ref 5–15)
BUN: 16 mg/dL (ref 6–20)
CO2: 26 mmol/L (ref 22–32)
Calcium: 9.5 mg/dL (ref 8.9–10.3)
Chloride: 101 mmol/L (ref 98–111)
Creatinine, Ser: 0.96 mg/dL (ref 0.44–1.00)
GFR calc Af Amer: >60 mL/min (ref >60)
GFR calc non Af Amer: >60 mL/min (ref >60)
Glucose, Bld: 108 mg/dL — ABNORMAL HIGH (ref 70–99)
Potassium: 3.5 mmol/L (ref 3.5–5.1)
Sodium: 136 mmol/L (ref 135–145)
Total Bilirubin: 0.6 mg/dL (ref 0.3–1.2)
Total Protein: 8.1 g/dL (ref 6.5–8.1)

## 2018-11-17 LAB — LIPASE, BLOOD: Lipase: 32 U/L (ref 11–51)

## 2018-11-17 LAB — URINALYSIS, ROUTINE W REFLEX MICROSCOPIC
Bilirubin Urine: NEGATIVE
Glucose, UA: NEGATIVE mg/dL
Hgb urine dipstick: NEGATIVE
Ketones, ur: NEGATIVE mg/dL
Leukocytes,Ua: NEGATIVE
Nitrite: NEGATIVE
Protein, ur: NEGATIVE mg/dL
Specific Gravity, Urine: 1.027 (ref 1.005–1.030)
pH: 5 (ref 5.0–8.0)

## 2018-11-17 LAB — CBC
HCT: 45.2 % (ref 36.0–46.0)
Hemoglobin: 15 g/dL (ref 12.0–15.0)
MCH: 30.5 pg (ref 26.0–34.0)
MCHC: 33.2 g/dL (ref 30.0–36.0)
MCV: 91.9 fL (ref 80.0–100.0)
Platelets: 302 10*3/uL (ref 150–400)
RBC: 4.92 MIL/uL (ref 3.87–5.11)
RDW: 13 % (ref 11.5–15.5)
WBC: 11.4 10*3/uL — ABNORMAL HIGH (ref 4.0–10.5)
nRBC: 0 % (ref 0.0–0.2)

## 2018-11-17 LAB — I-STAT BETA HCG BLOOD, ED (MC, WL, AP ONLY): I-stat hCG, quantitative: <5 m[IU]/mL (ref <5)

## 2018-11-17 MED ORDER — SODIUM CHLORIDE 0.9 % IV SOLN
Freq: Once | INTRAVENOUS | Status: AC
  Administered 2018-11-17: 06:00:00 125 mL/h via INTRAVENOUS

## 2018-11-17 MED ORDER — ONDANSETRON HCL 4 MG PO TABS: 4.0000 mg | ORAL_TABLET | Freq: Three times a day (TID) | ORAL | 0 refills | Status: DC | PRN

## 2018-11-17 MED ORDER — SODIUM CHLORIDE 0.9% FLUSH
3.0000 mL | Freq: Once | INTRAVENOUS | Status: AC
  Administered 2018-11-17: 3 mL via INTRAVENOUS

## 2018-11-17 MED ORDER — METOCLOPRAMIDE HCL 5 MG/ML IJ SOLN
10.0000 mg | Freq: Once | INTRAMUSCULAR | Status: AC
  Administered 2018-11-17: 10 mg via INTRAVENOUS
  Filled 2018-11-17: qty 2

## 2018-11-17 MED ORDER — SODIUM CHLORIDE (PF) 0.9 % IJ SOLN
INTRAMUSCULAR | Status: AC
  Filled 2018-11-17: qty 50

## 2018-11-17 MED ORDER — IOHEXOL 300 MG/ML  SOLN
100.0000 mL | Freq: Once | INTRAMUSCULAR | Status: AC | PRN
  Administered 2018-11-17: 100 mL via INTRAVENOUS

## 2018-11-17 MED ORDER — OXYCODONE HCL 5 MG PO TABS: 2.5000 mg | ORAL_TABLET | Freq: Four times a day (QID) | ORAL | 0 refills | Status: DC | PRN

## 2018-11-17 MED ORDER — FENTANYL CITRATE (PF) 100 MCG/2ML IJ SOLN
50.0000 ug | Freq: Once | INTRAMUSCULAR | Status: AC
  Administered 2018-11-17: 50 ug via INTRAVENOUS
  Filled 2018-11-17: qty 2

## 2018-11-17 MED ORDER — HYDROMORPHONE HCL 1 MG/ML IJ SOLN
1.0000 mg | Freq: Once | INTRAMUSCULAR | Status: AC
  Administered 2018-11-17: 1 mg via INTRAVENOUS
  Filled 2018-11-17: qty 1

## 2018-11-17 NOTE — ED Triage Notes (Signed)
Pt reports having abdominal pain with radiation to back. Denies any urinary symptoms. Intermittent nausea. No vomiting.

## 2018-11-17 NOTE — ED Provider Notes (Signed)
7:05 AM  Patient taken in sign out from [place].  Here for RUQ abdominal pain. Hx HIV (last CD4 of 478). Hx PE  (Provoked) Review of VS shows patient afebrile but tachypenic and tachycardic. The emergent DDX for RUQ pain includes but is not limited to Glabladder disease, PUD, Acute Hepatitis, Pancreatitis, pyelonephritis, Pneumonia, Lower lobe PE/Infarct, Kidney stone, GERD, retrocecal appendicitis, Fitz-Hugh-Curtis syndrome, AAA, MI, Zoster.   7:45 AM I personally reviewed the patient's RUQ abdominal US which shows no evidence of acute cholecystitis. Normal caliber CBD. Patient is obese which may limit the [place], She has continues pain. Will proceed with CT abd/ Pelvis  Patient CT scan returned showing large uterine fibroid with right-sided hydroureteronephrosis. On reexamination the patient has right CVA tenderness.  She states that she knows about this and has already seen her gynecologist and a urologist in the past for evaluation of stenting and hysterectomy however the patient states that she was not cleared for surgery because she was still on Xarelto at the time.   I discussed the case with Dr. Gloriann Loan who is on-call with urology states that stent would not work well in this setting and he advises close GYN follow-up.  I spoke with Dr. Vanessa Kick who is the patient's gynecologist and happened to be on-call for the practice.  She knows the patient well and will try to coordinate surgical intervention as she feels the complexity of the cases beyond her scope.  Patient will be discharged with pain medications.  She has no evidence of kidney injury or urinary tract infection.  I discussed return precautions.  She was appropriate for discharge at this time.   Margarita Mail, PA-C 11/17/18 1630    Dorie Rank, MD 11/18/18 1102

## 2018-11-17 NOTE — Discharge Instructions (Addendum)
Contact a health care provider if: You have a fever or chills. Your urine smells bad or looks cloudy. You have pain or burning when you pass urine. Get help right away if: Your flank pain or groin pain suddenly worsens. You become confused or disoriented or you lose consciousness. 

## 2018-11-17 NOTE — ED Provider Notes (Signed)
[facility] DEPT Provider Note   CSN: [id number] Arrival date & time: 11/17/18  0218    History   Chief Complaint Chief Complaint  Patient presents with  . Abdominal Pain    HPI [name] is a 43 y.o. female.     44 year old female with a history of HIV (CD4 478), PE (provoked by ORIF, not on chronic anticoagulation) presents to the emergency department for evaluation of abdominal pain.  She has had abdominal pain over the past few days which is primarily in her right upper abdomen.  It radiates to her right side.  Denies any known provoking features.  She has had some associated constipation with a small bowel movement yesterday.  Reports some associated nausea in triage which waxes and wanes.  She has not had any vomiting, fevers, urinary symptoms.  No history of abdominal surgeries.  The history is provided by the patient. No language interpreter was used.  Abdominal Pain   Past Medical History:  Diagnosis Date  . Anxiety   . Candidal esophagitis ([place])   . Complication of anesthesia 07/2011   in PACU took long time for oxygen sts to come up,2008 tonsillectomy went to ICU  . Family history of adverse reaction to anesthesia    mother H/O n/v  . Fracture of right ankle, lateral malleolus   . HIV (human immunodeficiency virus infection) ([place])   . MAC (mycobacterium avium-intracellulare complex)    positive culture, treated for 3 months  . Pulmonary embolism [facility])     Patient Active Problem List   Diagnosis Date Noted  . Medication monitoring encounter 03/25/2018  . Right leg DVT ([place]) 11/11/2017  . Bilateral pulmonary embolism ([place]) 11/10/2017  . Granulomatous adenopathy 12/21/2016  . Fistula, skin 10/03/2016  . Screening examination for venereal disease 08/06/2013  . Encounter for long-term (current) use of high-risk medication 08/06/2013  . HIV disease ([place]) 12/29/2010  . Anxiety 12/29/2010    Past Surgical History:   Procedure Laterality Date  . ANKLE ARTHROSCOPY WITH OPEN REDUCTION INTERNAL FIXATION (ORIF) Right 10/31/2017  . ANTERIOR CRUCIATE LIGAMENT REPAIR     Right and left repair  . LYMPH NODE BIOPSY Left 11/14/2016   Procedure: Left Cervical node I/D (incision and drain) and Biopsy;  Surgeon: [name], MD;  Location: [facility];  Service: Thoracic;  Laterality: Left;  . NASAL RECONSTRUCTION  1982  . ORIF ANKLE FRACTURE Right 11/01/2017   Procedure: OPEN REDUCTION INTERNAL FIXATION (ORIF) right lateral malleolus fracture;  Surgeon: Wylene Simmer, MD;  Location: [facility];  Service: Orthopedics;  Laterality: Right;  . [place] NODE BIOPSY  07/28/2011   Procedure: SUPRACLAVICAL NODE BIOPSY;  Surgeon: Nicanor Alcon, MD;  Location: [facility];  Service: Thoracic;  Laterality: Left;  . TONSILLECTOMY       OB History   No obstetric history on file.      Home Medications    Prior to Admission medications   Medication Sig Start Date End Date Taking? Authorizing Provider  ALPRAZolam (XANAX) 0.25 MG tablet TK 1 T PO QD PRN 12/20/17   [provider]  escitalopram (LEXAPRO) 10 MG tablet TK SS T PO X 7 DAYS THEN 1 T PO QD 01/19/18   [provider]  GENVOYA 150-150-200-10 MG TABS tablet TAKE 1 TABLET BY MOUTH DAILY 10/14/18   Comer, Okey Regal, MD  hydrochlorothiazide (MICROZIDE) 12.5 MG capsule Take 1 capsule (12.5 mg total) by mouth daily. 11/13/17   Cordelia Poche  A, MD  PREZISTA 800 MG tablet TAKE 1 TABLET BY MOUTH EVERY DAY 10/14/18   Comer, Okey Regal, MD    Family History Family History  Problem Relation Age of Onset  . Anesthesia problems Neg Hx   . Hypotension Neg Hx   . Malignant hyperthermia Neg Hx   . Pseudochol deficiency Neg Hx     Social History Social History   Tobacco Use  . Smoking status: Never Smoker  . Smokeless tobacco: Never Used  Substance Use Topics  . Alcohol use: Yes    Comment: socially  . Drug use: No     Allergies   Bactrim  [sulfamethoxazole-trimethoprim]   Review of Systems Review of Systems  Gastrointestinal: Positive for abdominal pain.  Ten systems reviewed and are negative for acute change, except as noted in the HPI.    Physical Exam Updated Vital Signs BP 140/85 (BP Location: Right Arm)   Pulse (!) 104   Temp 98.2 F (36.8 C) (Oral)   Resp (!) 21   Ht _0  (1.778 m)   Wt 134.3 kg   LMP 10/20/2018   SpO2 100%   BMI 42.47 kg/m   Physical Exam Vitals signs and nursing note reviewed.  Constitutional:      General: She is not in acute distress.    Appearance: She is well-developed. She is not diaphoretic.     Comments: Nontoxic-appearing and in no distress.  Obese, African-American female.  Pleasant.  HENT:     Head: Normocephalic and atraumatic.  Eyes:     General: No scleral icterus.    Conjunctiva/sclera: Conjunctivae normal.  Neck:     Musculoskeletal: Normal range of motion.  Pulmonary:     Effort: Pulmonary effort is normal. No respiratory distress.     Breath sounds: No stridor.     Comments: Respirations even and unlabored Abdominal:     Palpations: Abdomen is soft. There is no mass.     Tenderness: There is abdominal tenderness.     Comments: Exam limited secondary to body habitus.  There is tenderness in the right upper and right lower quadrants.  Negative Murphy sign.  No peritoneal signs, palpable masses, guarding.  Musculoskeletal: Normal range of motion.  Skin:    General: Skin is warm and dry.     Coloration: Skin is not pale.     Findings: No erythema or rash.  Neurological:     Mental Status: She is alert and oriented to person, place, and time.     Coordination: Coordination normal.     Comments: GCS 15.  Moving all extremities spontaneously.  Psychiatric:        Behavior: Behavior normal.      ED Treatments / Results  Labs (all labs ordered are listed, but only abnormal results are displayed) Labs Reviewed  COMPREHENSIVE METABOLIC PANEL - Abnormal;  Notable for the following components:      Result Value   Glucose, Bld 108 (*)    All other components within normal limits  CBC - Abnormal; Notable for the following components:   WBC 11.4 (*)    All other components within normal limits  URINALYSIS, ROUTINE W REFLEX MICROSCOPIC - Abnormal; Notable for the following components:   APPearance HAZY (*)    All other components within normal limits  LIPASE, BLOOD  I-STAT BETA HCG BLOOD, ED ([facility], [facility], [facility] ONLY)    EKG None  Radiology No results found.  Procedures Procedures (including critical care time)  Medications Ordered  in ED Medications  sodium chloride flush (NS) 0.9 % injection 3 mL (3 mLs Intravenous Given 11/17/18 0406)  fentaNYL (SUBLIMAZE) injection 50 mcg (50 mcg Intravenous Given 11/17/18 0406)  metoCLOPramide (REGLAN) injection 10 mg (10 mg Intravenous Given 11/17/18 0405)  HYDROmorphone (DILAUDID) injection 1 mg (1 mg Intravenous Given 11/17/18 0616)  0.9 %  sodium chloride infusion (125 mL/hr Intravenous New Bag/Given 11/17/18 0616)     Initial Impression / Assessment and Plan / ED Course  I have reviewed the triage vital signs and the nursing notes.  Pertinent labs & imaging results that were available during my care of the patient were reviewed by me and considered in my medical decision making (see chart for details).        5:51 AM Patient with persistent pain on repeat assessment.  Her abdominal exam is stable continues to have focal tenderness, primarily in the right mid and upper abdomen.  Will order 1 mg IV Dilaudid pending ultrasound.  Thus far, labs have been reassuring notable only for nonspecific and mild leukocytosis.  7:07 AM Ultrasound results pending.  Patient signed out to Margarita Mail, PA-C at change of shift who will follow-up on imaging and disposition appropriately.   Final Clinical Impressions(s) / ED Diagnoses   Final diagnoses:  Abdominal pain, unspecified abdominal location    ED  Discharge Orders    None       Antonietta Breach, PA-C 11/17/18 2446    Veryl Speak, MD 11/19/18 2300

## 2018-12-04 DIAGNOSIS — D259 Leiomyoma of uterus, unspecified: Secondary | ICD-10-CM | POA: Diagnosis not present

## 2018-12-04 DIAGNOSIS — R1031 Right lower quadrant pain: Secondary | ICD-10-CM | POA: Diagnosis not present

## 2019-02-07 DIAGNOSIS — Z1231 Encounter for screening mammogram for malignant neoplasm of breast: Secondary | ICD-10-CM | POA: Diagnosis not present

## 2019-02-07 DIAGNOSIS — Z0000 Encounter for general adult medical examination without abnormal findings: Secondary | ICD-10-CM | POA: Diagnosis not present

## 2019-02-07 DIAGNOSIS — R87610 Atypical squamous cells of undetermined significance on cytologic smear of cervix (ASC-US): Secondary | ICD-10-CM | POA: Diagnosis not present

## 2019-02-07 DIAGNOSIS — Z124 Encounter for screening for malignant neoplasm of cervix: Secondary | ICD-10-CM | POA: Diagnosis not present

## 2019-02-17 ENCOUNTER — Other Ambulatory Visit: Payer: Self-pay

## 2019-02-17 DIAGNOSIS — B20 Human immunodeficiency virus [HIV] disease: Secondary | ICD-10-CM

## 2019-02-19 ENCOUNTER — Other Ambulatory Visit: Payer: Self-pay

## 2019-02-19 DIAGNOSIS — B20 Human immunodeficiency virus [HIV] disease: Secondary | ICD-10-CM

## 2019-02-19 DIAGNOSIS — Z113 Encounter for screening for infections with a predominantly sexual mode of transmission: Secondary | ICD-10-CM

## 2019-02-19 DIAGNOSIS — Z79899 Other long term (current) drug therapy: Secondary | ICD-10-CM

## 2019-02-19 NOTE — Addendum Note (Signed)
Addended by: Carlean Purl on: 02/19/2019 12:17 PM   Modules accepted: Orders

## 2019-02-20 ENCOUNTER — Other Ambulatory Visit: Payer: Medicaid Other

## 2019-02-20 ENCOUNTER — Other Ambulatory Visit: Payer: Self-pay

## 2019-02-20 DIAGNOSIS — Z113 Encounter for screening for infections with a predominantly sexual mode of transmission: Secondary | ICD-10-CM | POA: Diagnosis not present

## 2019-02-20 DIAGNOSIS — B20 Human immunodeficiency virus [HIV] disease: Secondary | ICD-10-CM | POA: Diagnosis not present

## 2019-02-20 DIAGNOSIS — Z79899 Other long term (current) drug therapy: Secondary | ICD-10-CM | POA: Diagnosis not present

## 2019-02-21 LAB — T-HELPER CELL (CD4) - (RCID CLINIC ONLY)
CD4 % Helper T Cell: 39 % (ref 33–65)
CD4 T Cell Abs: 572 /uL (ref 400–1790)

## 2019-02-23 LAB — COMPREHENSIVE METABOLIC PANEL
AG Ratio: 1.3 (calc) (ref 1.0–2.5)
ALT: 14 U/L (ref 6–29)
AST: 14 U/L (ref 10–30)
Albumin: 3.9 g/dL (ref 3.6–5.1)
Alkaline phosphatase (APISO): 82 U/L (ref 31–125)
BUN: 12 mg/dL (ref 7–25)
CO2: 25 mmol/L (ref 20–32)
Calcium: 9.1 mg/dL (ref 8.6–10.2)
Chloride: 104 mmol/L (ref 98–110)
Creat: 0.93 mg/dL (ref 0.50–1.10)
Globulin: 3 g/dL (calc) (ref 1.9–3.7)
Glucose, Bld: 117 mg/dL — ABNORMAL HIGH (ref 65–99)
Potassium: 4.2 mmol/L (ref 3.5–5.3)
Sodium: 138 mmol/L (ref 135–146)
Total Bilirubin: 0.4 mg/dL (ref 0.2–1.2)
Total Protein: 6.9 g/dL (ref 6.1–8.1)

## 2019-02-23 LAB — CBC WITH DIFFERENTIAL/PLATELET
Absolute Monocytes: 382 cells/uL (ref 200–950)
Basophils Absolute: 58 cells/uL (ref 0–200)
Basophils Relative: 0.7 %
Eosinophils Absolute: 324 cells/uL (ref 15–500)
Eosinophils Relative: 3.9 %
HCT: 40.9 % (ref 35.0–45.0)
Hemoglobin: 13.6 g/dL (ref 11.7–15.5)
Lymphs Abs: 2009 cells/uL (ref 850–3900)
MCH: 30.3 pg (ref 27.0–33.0)
MCHC: 33.3 g/dL (ref 32.0–36.0)
MCV: 91.1 fL (ref 80.0–100.0)
MPV: 9.9 fL (ref 7.5–12.5)
Monocytes Relative: 4.6 %
Neutro Abs: 5528 cells/uL (ref 1500–7800)
Neutrophils Relative %: 66.6 %
Platelets: 323 10*3/uL (ref 140–400)
RBC: 4.49 10*6/uL (ref 3.80–5.10)
RDW: 12.7 % (ref 11.0–15.0)
Total Lymphocyte: 24.2 %
WBC: 8.3 10*3/uL (ref 3.8–10.8)

## 2019-02-23 LAB — HIV-1 RNA QUANT-NO REFLEX-BLD
HIV 1 RNA Quant: <20 copies/mL
HIV-1 RNA Quant, Log: <1.3 Log copies/mL

## 2019-02-23 LAB — RPR: RPR Ser Ql: NONREACTIVE

## 2019-02-23 LAB — LIPID PANEL
Cholesterol: 197 mg/dL (ref <200)
HDL: 42 mg/dL — ABNORMAL LOW (ref >=50)
LDL Cholesterol (Calc): 137 mg/dL (calc) — ABNORMAL HIGH
Non-HDL Cholesterol (Calc): 155 mg/dL (calc) — ABNORMAL HIGH (ref <130)
Total CHOL/HDL Ratio: 4.7 (calc) (ref <5.0)
Triglycerides: 79 mg/dL (ref <150)

## 2019-03-05 ENCOUNTER — Telehealth: Payer: Self-pay

## 2019-03-05 NOTE — Telephone Encounter (Signed)
COVID-19 Pre-Screening Questions:  Do you currently have a fever (>100 F), chills or unexplained body aches? NO   Are you currently experiencing new cough, shortness of breath, sore throat, runny nose?NO .  Have you recently travelled outside the state of [place] in the last 14 days? NO .  Have you been in contact with someone that is currently pending confirmation of Covid19 testing or has been confirmed to have the Covid19 virus?  NO  **If the patient answers NO to ALL questions -  advise the patient to please call the clinic before coming to the office should any symptoms develop.     

## 2019-03-06 ENCOUNTER — Encounter: Payer: Self-pay | Admitting: Internal Medicine

## 2019-03-06 ENCOUNTER — Other Ambulatory Visit: Payer: Self-pay | Admitting: Obstetrics and Gynecology

## 2019-03-06 ENCOUNTER — Ambulatory Visit (INDEPENDENT_AMBULATORY_CARE_PROVIDER_SITE_OTHER): Payer: Medicaid Other | Admitting: Internal Medicine

## 2019-03-06 ENCOUNTER — Other Ambulatory Visit: Payer: Self-pay

## 2019-03-06 VITALS — Ht 70.0 in | Wt 286.0 lb

## 2019-03-06 DIAGNOSIS — Z23 Encounter for immunization: Secondary | ICD-10-CM

## 2019-03-06 DIAGNOSIS — B20 Human immunodeficiency virus [HIV] disease: Secondary | ICD-10-CM | POA: Diagnosis not present

## 2019-03-06 DIAGNOSIS — R599 Enlarged lymph nodes, unspecified: Secondary | ICD-10-CM

## 2019-03-06 DIAGNOSIS — Z113 Encounter for screening for infections with a predominantly sexual mode of transmission: Secondary | ICD-10-CM

## 2019-03-06 DIAGNOSIS — Z5181 Encounter for therapeutic drug level monitoring: Secondary | ICD-10-CM | POA: Diagnosis not present

## 2019-03-06 NOTE — Assessment & Plan Note (Signed)
Screened negative 

## 2019-03-06 NOTE — Assessment & Plan Note (Signed)
Creat, LFTs, wnl

## 2019-03-06 NOTE — Assessment & Plan Note (Signed)
Doing well on the salvage regiment.  No changes and rtc 6 months.

## 2019-03-06 NOTE — Progress Notes (Signed)
   Subjective:    Patient ID: [name], female    DOB: 1975-11-26, 43 y.o.   MRN: [id number]  HPI She is here for follow up of HIV and granulomatous lymphadenopathy.  She continues on a salvage regimen with Genvoya and Prezista.  She is going to get a hysterectomy for fibroids next month.  She has no complaints.  No associated n/v/d.  Neck with no new swelling. CD4 572, viral load < 20.  Creat good.    Review of Systems  Constitutional: Negative for fatigue and fever.  Skin: Negative for rash.       Objective:   Physical Exam Constitutional:      Appearance: She is well-developed.  Eyes:     General: No scleral icterus. Neck:     Comments: Previous open area now closed, no erythema, no drainage Cardiovascular:     Rate and Rhythm: Normal rate and regular rhythm.     Heart sounds: Normal heart sounds.  Pulmonary:     Effort: Pulmonary effort is normal. No respiratory distress.     Breath sounds: Normal breath sounds.  Lymphadenopathy:     Cervical: No cervical adenopathy.  Skin:    Findings: No rash.    SH: no tobacco       Assessment & Plan:

## 2019-03-06 NOTE — Assessment & Plan Note (Signed)
This has remained stable after completing recent treatment

## 2019-03-10 NOTE — Progress Notes (Signed)
[facility] [place], [place] [address] Phone: [phone] Fax: [phone]  [place], [place] - [address] AT [facility] [address] [place] [address] Phone: [phone] Fax: [phone]      Your procedure is scheduled on Tuesday, Dec. 1st.  Report to [facility] Main Entrance "A" at 5:30  A.M., and check in at the Admitting office.  Call this number if you have problems the morning of surgery:  [phone]  Call [phone] if you have any questions prior to your surgery date Monday-Friday 8am-4pm    Remember:  Do not eat after midnight the night before your surgery  You may drink clear liquids until 4:15 AM the morning of your surgery.   Clear liquids allowed are: Water, Non-Citrus Juices (without pulp), Carbonated Beverages, Clear Tea, Black Coffee Only, and Gatorade     Take these medicines the morning of surgery with A SIP OF WATER   Alprazolam (Xanax) - if needed  Zofran - if needed  7 days prior to surgery STOP taking any Aspirin (unless otherwise instructed by your surgeon), Aleve, Naproxen, Ibuprofen, Motrin, Advil, Goody's, BC's, all herbal medications, fish oil, and all vitamins.    The Morning of Surgery  Do not wear jewelry, make-up or nail polish.  Do not wear lotions, powders, perfume, or deodorant  Do not shave 48 hours prior to surgery.    Do not bring valuables to the hospital.  [facility] is not responsible for any belongings or valuables.  If you are a smoker, DO NOT Smoke 24 hours prior to surgery  If you wear a CPAP at night please bring your mask, tubing, and machine the morning of surgery   Remember that you must have someone to transport you home after your surgery, and remain with you for 24 hours if you are discharged the same day.   Please bring cases for contacts, glasses, hearing  aids, dentures or bridgework because it cannot be worn into surgery.    Leave your suitcase in the car.  After surgery it may be brought to your room.  For patients admitted to the hospital, discharge time will be determined by your treatment team.  Patients discharged the day of surgery will not be allowed to drive home.    Special instructions:   [facility]- Preparing For Surgery  Before surgery, you can play an important role. Because skin is not sterile, your skin needs to be as free of germs as possible. You can reduce the number of germs on your skin by washing with CHG (chlorahexidine gluconate) Soap before surgery.  CHG is an antiseptic cleaner which kills germs and bonds with the skin to continue killing germs even after washing.    Oral Hygiene is also important to reduce your risk of infection.  Remember - BRUSH YOUR TEETH THE MORNING OF SURGERY WITH YOUR REGULAR TOOTHPASTE  Please do not use if you have an allergy to CHG or antibacterial soaps. If your skin becomes reddened/irritated stop using the CHG.  Do not shave (including legs and underarms) for at least 48 hours prior to first CHG shower. It is OK to shave your face.  Please follow these instructions carefully.   1. Shower the NIGHT BEFORE SURGERY and the MORNING OF SURGERY with CHG Soap.   2. If you chose to wash your hair, wash  your hair first as usual with your normal shampoo.  3. After you shampoo, rinse your hair and body thoroughly to remove the shampoo.  4. Use CHG as you would any other liquid soap. You can apply CHG directly to the skin and wash gently with a scrungie or a clean washcloth.   5. Apply the CHG Soap to your body ONLY FROM THE NECK DOWN.  Do not use on open wounds or open sores. Avoid contact with your eyes, ears, mouth and genitals (private parts). Wash Face and genitals (private parts)  with your normal soap.   6. Wash thoroughly, paying special attention to the area where your surgery will  be performed.  7. Thoroughly rinse your body with warm water from the neck down.  8. DO NOT shower/wash with your normal soap after using and rinsing off the CHG Soap.  9. Pat yourself dry with a CLEAN TOWEL.  10. Wear CLEAN PAJAMAS to bed the night before surgery, wear comfortable clothes the morning of surgery  11. Place CLEAN SHEETS on your bed the night of your first shower and DO NOT SLEEP WITH PETS.    Day of Surgery:  Please shower the morning of surgery with the CHG soap Do not apply any deodorants/lotions. Please wear clean clothes to the hospital/surgery center.   Remember to brush your teeth WITH YOUR REGULAR TOOTHPASTE.   Please read over the following fact sheets that you were given.

## 2019-03-11 ENCOUNTER — Other Ambulatory Visit: Payer: Self-pay

## 2019-03-11 ENCOUNTER — Encounter (HOSPITAL_COMMUNITY): Payer: Self-pay

## 2019-03-11 ENCOUNTER — Encounter (HOSPITAL_COMMUNITY)
Admission: RE | Admit: 2019-03-11 | Discharge: 2019-03-11 | Disposition: A | Discharge status: Home / Self Care | Payer: Medicaid Other | Source: Ambulatory Visit | Attending: Obstetrics and Gynecology | Admitting: Obstetrics and Gynecology

## 2019-03-11 DIAGNOSIS — Z01812 Encounter for preprocedural laboratory examination: Secondary | ICD-10-CM | POA: Insufficient documentation

## 2019-03-11 LAB — COMPREHENSIVE METABOLIC PANEL
ALT: 17 U/L (ref 0–44)
AST: 18 U/L (ref 15–41)
Albumin: 3.5 g/dL (ref 3.5–5.0)
Alkaline Phosphatase: 72 U/L (ref 38–126)
Anion gap: 8 (ref 5–15)
BUN: 10 mg/dL (ref 6–20)
CO2: 26 mmol/L (ref 22–32)
Calcium: 9.1 mg/dL (ref 8.9–10.3)
Chloride: 104 mmol/L (ref 98–111)
Creatinine, Ser: 0.92 mg/dL (ref 0.44–1.00)
GFR calc Af Amer: >60 mL/min (ref >60)
GFR calc non Af Amer: >60 mL/min (ref >60)
Glucose, Bld: 123 mg/dL — ABNORMAL HIGH (ref 70–99)
Potassium: 4.1 mmol/L (ref 3.5–5.1)
Sodium: 138 mmol/L (ref 135–145)
Total Bilirubin: 0.6 mg/dL (ref 0.3–1.2)
Total Protein: 6.9 g/dL (ref 6.5–8.1)

## 2019-03-11 LAB — PROTIME-INR
INR: 0.9 (ref 0.8–1.2)
Prothrombin Time: 12.5 seconds (ref 11.4–15.2)

## 2019-03-11 LAB — CBC
HCT: 41.7 % (ref 36.0–46.0)
Hemoglobin: 13.6 g/dL (ref 12.0–15.0)
MCH: 30.1 pg (ref 26.0–34.0)
MCHC: 32.6 g/dL (ref 30.0–36.0)
MCV: 92.3 fL (ref 80.0–100.0)
Platelets: 303 10*3/uL (ref 150–400)
RBC: 4.52 MIL/uL (ref 3.87–5.11)
RDW: 13.1 % (ref 11.5–15.5)
WBC: 6.8 10*3/uL (ref 4.0–10.5)
nRBC: 0 % (ref 0.0–0.2)

## 2019-03-11 LAB — TYPE AND SCREEN
ABO/RH(D): O POS
Antibody Screen: NEGATIVE

## 2019-03-11 LAB — APTT: aPTT: 31 seconds (ref 24–36)

## 2019-03-11 NOTE — Progress Notes (Signed)
PCP - Jonathon Jordan Cardiologist - denies   Chest x-ray - 08-28-18 EKG - 08-29-18  Aspirin Instructions: Stop taking 7 days prior to surgery  ERAS Protcol - yes, no drink ordered or given  COVID TEST- Friday, Nov. 24   Anesthesia review: n/a  Patient denies shortness of breath, fever, cough and chest pain at PAT appointment   All instructions explained to the patient, with a verbal understanding of the material. Patient agrees to go over the instructions while at home for a better understanding. Patient also instructed to self quarantine after being tested for COVID-19. The opportunity to ask questions was provided.

## 2019-03-14 ENCOUNTER — Other Ambulatory Visit (HOSPITAL_COMMUNITY)
Admission: RE | Admit: 2019-03-14 | Discharge: 2019-03-14 | Disposition: A | Discharge status: Home / Self Care | Payer: Medicaid Other | Source: Ambulatory Visit | Attending: Obstetrics and Gynecology | Admitting: Obstetrics and Gynecology

## 2019-03-14 DIAGNOSIS — Z20828 Contact with and (suspected) exposure to other viral communicable diseases: Secondary | ICD-10-CM | POA: Diagnosis not present

## 2019-03-14 DIAGNOSIS — Z01812 Encounter for preprocedural laboratory examination: Secondary | ICD-10-CM | POA: Diagnosis not present

## 2019-03-14 LAB — SARS CORONAVIRUS 2 (TAT 6-24 HRS): SARS Coronavirus 2: NEGATIVE

## 2019-03-17 MED ORDER — DEXTROSE 5 % IV SOLN
3.0000 g | INTRAVENOUS | Status: AC
  Administered 2019-03-18: 3 g via INTRAVENOUS
  Filled 2019-03-17: qty 3

## 2019-03-17 NOTE — H&P (Signed)
43 y.o. G0. complains of symptomatic fibroid uterus.   Cervix pushed by fibroid precludeds MIS.    Previously:"Per ED note 11/17/2018: Patient CT scan returned showing large uterine fibroid with right-sided hydroureteronephrosis. On reexamination the patient has right CVA tenderness. She states that she knows about this and has already seen her gynecologist and a urologist in the past for evaluation of stenting and hysterectomy however the patient states that she was not cleared for surgery because she was still on Xarelto at the time. I discussed the case with Dr. Gloriann Loan who is on-call with urology states that stent would not work well in this setting and he advises close GYN follow-up. I spoke with Dr. Vanessa Kick who is the patient's gynecologist and happened to be on-call for the practice. She knows the patient well and will try to coordinate surgical intervention as she feels the complexity of the cases beyond her scope. Patient will be discharged with pain medications. She has no evidence of kidney injury or urinary tract infection. I discussed return precautions. She was appropriate for discharge at this time."  CT 11-2018: Adrenals/Urinary Tract: There is mild to moderate right hydronephrosis and hydroureter. The right ureter is dilated down to the level of the right pelvic sidewall where and enlarged uterus generates mass effect along the right pelvic sidewall. This is chronic and unchanged compared prior exam. There is no left hydronephrosis. No focal renal lesion is identified. The bilateral adrenal glands are normal. Bladder is normal.   Stomach/Bowel: Stomach is within normal limits. Appendix appears normal. No evidence of bowel wall thickening, distention, or inflammatory changes.   Vascular/Lymphatic: No significant vascular findings are present. No enlarged abdominal or pelvic lymph nodes.   Reproductive: The uterus is enlarged measuring 14.5 x 21 x 10 cm due to large fibroids.  The overall size of the uterus is probably not significantly changed compared prior exam.  Pedunculated fibroid LUS at 7+ cm and intramural 6 cm fibroid.   Pt has hx of HIV- on meds and last viral load was undetectable in May 2020. Doing very well. DId have mycobacterium avium and was treated twice for this - could be recurrent.  HX of PE and not currently on Coumadin - off Dec 2019. DVT happened from broken ankle.   Uterus was about same size from prior studies. Was not really bothering at that time. Went to urologist last year secondary unable to empty bladder and was found that the fibroid was not allowing bladder to empty. Now the ureter is mildly dilated and some back.  Pain started in may to become more intense. Starts in back and goes around. Pain is 24/7 now. Pain meds are not helping. "  Hx of LSIL. Non smoker.  Hx of PE in July 2019- No meds since december last year. ASA 81 mg.  Pt HIV positive- undetectable for a while. On meds. HCTZ for swelling not for BP.  G0  .  Past Medical History:  Diagnosis Date  . Anxiety   . Candidal esophagitis ([place])   . Complication of anesthesia 07/2011   in PACU took long time for oxygen sts to come up,2008 tonsillectomy went to ICU  . Family history of adverse reaction to anesthesia    mother H/O n/v  . Fracture of right ankle, lateral malleolus   . HIV (human immunodeficiency virus infection) ([place])   . MAC (mycobacterium avium-intracellulare complex)    positive culture, treated for 3 months  . Pulmonary embolism ([place])  Past Surgical History:  Procedure Laterality Date  . ANKLE ARTHROSCOPY WITH OPEN REDUCTION INTERNAL FIXATION (ORIF) Right 10/31/2017  . ANTERIOR CRUCIATE LIGAMENT REPAIR     Right and left repair  . LYMPH NODE BIOPSY Left 11/14/2016   Procedure: Left Cervical node I/D (incision and drain) and Biopsy;  Surgeon: [name], MD;  Location: [facility];  Service: Thoracic;  Laterality: Left;  . NASAL RECONSTRUCTION   1982  . ORIF ANKLE FRACTURE Right 11/01/2017   Procedure: OPEN REDUCTION INTERNAL FIXATION (ORIF) right lateral malleolus fracture;  Surgeon: Wylene Simmer, MD;  Location: [facility];  Service: Orthopedics;  Laterality: Right;  . [place] NODE BIOPSY  07/28/2011   Procedure: SUPRACLAVICAL NODE BIOPSY;  Surgeon: Nicanor Alcon, MD;  Location: [facility];  Service: Thoracic;  Laterality: Left;  . TONSILLECTOMY      Social History   Socioeconomic History  . Marital status: Single    Spouse name: Not on file  . Number of children: Not on file  . Years of education: Not on file  . Highest education level: Not on file  Occupational History  . Not on file  Social Needs  . Financial resource strain: Not on file  . Food insecurity    Worry: Not on file    Inability: Not on file  . Transportation needs    Medical: Not on file    Non-medical: Not on file  Tobacco Use  . Smoking status: Never Smoker  . Smokeless tobacco: Never Used  Substance and Sexual Activity  . Alcohol use: Yes    Comment: socially  . Drug use: No  . Sexual activity: Not on file    Comment: declined condoms  Lifestyle  . Physical activity    Days per week: Not on file    Minutes per session: Not on file  . Stress: Not on file  Relationships  . Social Herbalist on phone: Not on file    Gets together: Not on file    Attends religious service: Not on file    Active member of club or organization: Not on file    Attends meetings of clubs or organizations: Not on file    Relationship status: Not on file  . Intimate partner violence    Fear of current or ex partner: Not on file    Emotionally abused: Not on file    Physically abused: Not on file    Forced sexual activity: Not on file  Other Topics Concern  . Not on file  Social History Narrative  . Not on file    No current facility-administered medications on file prior to encounter.    Current Outpatient Medications on File  Prior to Encounter  Medication Sig Dispense Refill  . ALPRAZolam (XANAX) 0.25 MG tablet Take 0.25 mg by mouth daily as needed for anxiety.   0  . cyclobenzaprine (FLEXERIL) 5 MG tablet Take 5 mg by mouth 3 (three) times daily as needed for muscle spasms.     . GENVOYA 150-150-200-10 MG TABS tablet TAKE 1 TABLET BY MOUTH DAILY 30 tablet 4  . hydrochlorothiazide (MICROZIDE) 12.5 MG capsule Take 1 capsule (12.5 mg total) by mouth daily. 30 capsule 0  . naproxen (NAPROSYN) 500 MG tablet Take 500 mg by mouth 2 (two) times daily as needed for mild pain.     Marland Kitchen ondansetron (ZOFRAN) 4 MG tablet Take 1 tablet (4 mg total) by mouth every 8 (eight) hours as  needed for nausea or vomiting. 10 tablet 0  . PREZISTA 800 MG tablet TAKE 1 TABLET BY MOUTH EVERY DAY 30 tablet 4    No Active Allergies  There were no vitals filed for this visit.  Lungs: clear to ascultation Cor:  RRR Abdomen:  soft, nontender, nondistended. Ex:  no cords, erythema Pelvic:   .   Vulva: no masses, no atrophy, no lesions .   Vagina: no tenderness, no erythema, no abnormal vaginal discharge, no vesicle(s) or ulcers, no cystocele, no rectocele .   Cervix: grossly normal (unable to see - pushed way anterior, very small and obscured by the fibroid.) .   Uterus: midline, no uterine prolapse, mobile, non-tender, enlarged (22 weeks, in cul de sac), contour irregular .   Bladder/Urethra: normal meatus, no urethral discharge, no urethral mass, bladder non distended, Urethra well supported .   Adnexa/Parametria: no parametrial tenderness, no parametrial mass, no adnexal tenderness, no ovarian mass  A:  Symptomatic fibroid uterus with compression of R ureter at pelvic brim.  Cervix is distorted by fibroids and precludes MIS  Pt was offered lupron to shrink fibroids but lupron was on backorder.  For TAH/salpingectomies   P: P: All risks, benefits and alternatives d/w patient and she desires to proceed.  Patient has undergone a modified  diet, ERAS protocol and will receive preop antibiotics and SCDs during the operation.  Will need to follow ureters down past cervix.   Daria Pastures

## 2019-03-18 ENCOUNTER — Encounter (HOSPITAL_COMMUNITY)
Admission: RE | Disposition: A | Discharge status: Home / Self Care | Payer: Self-pay | Source: Ambulatory Visit | Attending: Obstetrics and Gynecology

## 2019-03-18 ENCOUNTER — Inpatient Hospital Stay (HOSPITAL_COMMUNITY): Payer: Medicaid Other | Admitting: Certified Registered"

## 2019-03-18 ENCOUNTER — Inpatient Hospital Stay (HOSPITAL_COMMUNITY)
Admission: RE | Admit: 2019-03-18 | Discharge: 2019-03-20 | DRG: 742 | Disposition: A | Discharge status: Home / Self Care | Payer: Medicaid Other | Source: Ambulatory Visit | Attending: Obstetrics and Gynecology | Admitting: Obstetrics and Gynecology

## 2019-03-18 ENCOUNTER — Encounter (HOSPITAL_COMMUNITY): Payer: Self-pay | Admitting: *Deleted

## 2019-03-18 ENCOUNTER — Other Ambulatory Visit: Payer: Self-pay

## 2019-03-18 DIAGNOSIS — F419 Anxiety disorder, unspecified: Secondary | ICD-10-CM | POA: Diagnosis not present

## 2019-03-18 DIAGNOSIS — D259 Leiomyoma of uterus, unspecified: Principal | ICD-10-CM | POA: Diagnosis present

## 2019-03-18 DIAGNOSIS — Z79899 Other long term (current) drug therapy: Secondary | ICD-10-CM

## 2019-03-18 DIAGNOSIS — B20 Human immunodeficiency virus [HIV] disease: Secondary | ICD-10-CM | POA: Diagnosis present

## 2019-03-18 DIAGNOSIS — N135 Crossing vessel and stricture of ureter without hydronephrosis: Secondary | ICD-10-CM | POA: Diagnosis present

## 2019-03-18 DIAGNOSIS — Z86711 Personal history of pulmonary embolism: Secondary | ICD-10-CM

## 2019-03-18 DIAGNOSIS — Z20828 Contact with and (suspected) exposure to other viral communicable diseases: Secondary | ICD-10-CM | POA: Diagnosis not present

## 2019-03-18 DIAGNOSIS — Z9889 Other specified postprocedural states: Secondary | ICD-10-CM

## 2019-03-18 DIAGNOSIS — Z86718 Personal history of other venous thrombosis and embolism: Secondary | ICD-10-CM

## 2019-03-18 DIAGNOSIS — I2699 Other pulmonary embolism without acute cor pulmonale: Secondary | ICD-10-CM | POA: Diagnosis not present

## 2019-03-18 HISTORY — PX: ABDOMINAL HYSTERECTOMY: SHX81

## 2019-03-18 LAB — COMPREHENSIVE METABOLIC PANEL
ALT: 19 U/L (ref 0–44)
AST: 20 U/L (ref 15–41)
Albumin: 3.8 g/dL (ref 3.5–5.0)
Alkaline Phosphatase: 66 U/L (ref 38–126)
Anion gap: 11 (ref 5–15)
BUN: 10 mg/dL (ref 6–20)
CO2: 26 mmol/L (ref 22–32)
Calcium: 8.9 mg/dL (ref 8.9–10.3)
Chloride: 101 mmol/L (ref 98–111)
Creatinine, Ser: 0.89 mg/dL (ref 0.44–1.00)
GFR calc Af Amer: >60 mL/min (ref >60)
GFR calc non Af Amer: >60 mL/min (ref >60)
Glucose, Bld: 150 mg/dL — ABNORMAL HIGH (ref 70–99)
Potassium: 4.1 mmol/L (ref 3.5–5.1)
Sodium: 138 mmol/L (ref 135–145)
Total Bilirubin: 0.6 mg/dL (ref 0.3–1.2)
Total Protein: 7 g/dL (ref 6.5–8.1)

## 2019-03-18 LAB — POCT PREGNANCY, URINE: Preg Test, Ur: NEGATIVE

## 2019-03-18 SURGERY — HYSTERECTOMY, ABDOMINAL
Anesthesia: General | Site: Abdomen

## 2019-03-18 MED ORDER — DIPHENHYDRAMINE HCL 12.5 MG/5ML PO ELIX: 12.5000 mg | ORAL_SOLUTION | Freq: Four times a day (QID) | ORAL | Status: DC | PRN

## 2019-03-18 MED ORDER — OXYCODONE HCL 5 MG/5ML PO SOLN: 5.0000 mg | Freq: Once | ORAL | Status: DC | PRN

## 2019-03-18 MED ORDER — SUGAMMADEX SODIUM 200 MG/2ML IV SOLN
INTRAVENOUS | Status: DC | PRN
  Administered 2019-03-18: 400 mg via INTRAVENOUS

## 2019-03-18 MED ORDER — ONDANSETRON HCL 4 MG/2ML IJ SOLN: 4.0000 mg | Freq: Four times a day (QID) | INTRAMUSCULAR | Status: DC | PRN

## 2019-03-18 MED ORDER — BISACODYL 5 MG PO TBEC: 5.0000 mg | DELAYED_RELEASE_TABLET | Freq: Every day | ORAL | Status: DC | PRN

## 2019-03-18 MED ORDER — DEXMEDETOMIDINE HCL IN NACL 200 MCG/50ML IV SOLN
INTRAVENOUS | Status: AC
  Filled 2019-03-18: qty 50

## 2019-03-18 MED ORDER — 0.9 % SODIUM CHLORIDE (POUR BTL) OPTIME
TOPICAL | Status: DC | PRN
  Administered 2019-03-18: 2000 mL

## 2019-03-18 MED ORDER — SODIUM CHLORIDE 0.9% FLUSH: 9.0000 mL | INTRAVENOUS | Status: DC | PRN

## 2019-03-18 MED ORDER — LACTATED RINGERS IV SOLN
INTRAVENOUS | Status: DC
  Administered 2019-03-18 (×3): via INTRAVENOUS

## 2019-03-18 MED ORDER — PROPOFOL 10 MG/ML IV BOLUS
INTRAVENOUS | Status: AC
  Filled 2019-03-18: qty 20

## 2019-03-18 MED ORDER — MENTHOL 3 MG MT LOZG: 1.0000 | LOZENGE | OROMUCOSAL | Status: DC | PRN

## 2019-03-18 MED ORDER — HYDROMORPHONE HCL 1 MG/ML IJ SOLN
INTRAMUSCULAR | Status: AC
  Filled 2019-03-18: qty 0.5

## 2019-03-18 MED ORDER — FENTANYL CITRATE (PF) 250 MCG/5ML IJ SOLN
INTRAMUSCULAR | Status: AC
  Filled 2019-03-18: qty 5

## 2019-03-18 MED ORDER — ONDANSETRON HCL 4 MG/2ML IJ SOLN
INTRAMUSCULAR | Status: AC
  Filled 2019-03-18: qty 4

## 2019-03-18 MED ORDER — IBUPROFEN 800 MG PO TABS
800.0000 mg | ORAL_TABLET | Freq: Four times a day (QID) | ORAL | Status: DC
  Administered 2019-03-19 – 2019-03-20 (×2): 800 mg via ORAL
  Filled 2019-03-18 (×2): qty 1

## 2019-03-18 MED ORDER — DEXMEDETOMIDINE HCL 200 MCG/2ML IV SOLN
INTRAVENOUS | Status: DC | PRN
  Administered 2019-03-18 (×5): 8 ug via INTRAVENOUS

## 2019-03-18 MED ORDER — SENNOSIDES-DOCUSATE SODIUM 8.6-50 MG PO TABS: 1.0000 | ORAL_TABLET | Freq: Every evening | ORAL | Status: DC | PRN

## 2019-03-18 MED ORDER — VASOPRESSIN 20 UNIT/ML IV SOLN
INTRAVENOUS | Status: AC
  Filled 2019-03-18: qty 1

## 2019-03-18 MED ORDER — PANTOPRAZOLE SODIUM 40 MG PO TBEC
40.0000 mg | DELAYED_RELEASE_TABLET | Freq: Every day | ORAL | Status: DC
  Administered 2019-03-19 – 2019-03-20 (×2): 40 mg via ORAL
  Filled 2019-03-18 (×2): qty 1

## 2019-03-18 MED ORDER — LIDOCAINE 2% (20 MG/ML) 5 ML SYRINGE
INTRAMUSCULAR | Status: DC | PRN
  Administered 2019-03-18: 60 mg via INTRAVENOUS

## 2019-03-18 MED ORDER — ALBUMIN HUMAN 5 % IV SOLN
INTRAVENOUS | Status: DC | PRN
  Administered 2019-03-18 (×2): via INTRAVENOUS

## 2019-03-18 MED ORDER — FENTANYL CITRATE (PF) 100 MCG/2ML IJ SOLN
INTRAMUSCULAR | Status: DC | PRN
  Administered 2019-03-18: 100 ug via INTRAVENOUS
  Administered 2019-03-18 (×4): 50 ug via INTRAVENOUS
  Administered 2019-03-18: 100 ug via INTRAVENOUS
  Administered 2019-03-18 (×3): 50 ug via INTRAVENOUS

## 2019-03-18 MED ORDER — DIPHENHYDRAMINE HCL 50 MG/ML IJ SOLN: 12.5000 mg | Freq: Four times a day (QID) | INTRAMUSCULAR | Status: DC | PRN

## 2019-03-18 MED ORDER — ZOLPIDEM TARTRATE 5 MG PO TABS: 5.0000 mg | ORAL_TABLET | Freq: Every evening | ORAL | Status: DC | PRN

## 2019-03-18 MED ORDER — ONDANSETRON HCL 4 MG PO TABS: 4.0000 mg | ORAL_TABLET | Freq: Four times a day (QID) | ORAL | Status: DC | PRN

## 2019-03-18 MED ORDER — SODIUM CHLORIDE (PF) 0.9 % IJ SOLN
INTRAMUSCULAR | Status: AC
  Filled 2019-03-18: qty 50

## 2019-03-18 MED ORDER — NALOXONE HCL 0.4 MG/ML IJ SOLN: 0.4000 mg | INTRAMUSCULAR | Status: DC | PRN

## 2019-03-18 MED ORDER — ACETAMINOPHEN 500 MG PO TABS
ORAL_TABLET | ORAL | Status: AC
  Administered 2019-03-18: 1000 mg via ORAL
  Filled 2019-03-18: qty 2

## 2019-03-18 MED ORDER — HYDROMORPHONE HCL 1 MG/ML IJ SOLN
INTRAMUSCULAR | Status: DC | PRN
  Administered 2019-03-18 (×2): 0.5 mg via INTRAVENOUS

## 2019-03-18 MED ORDER — DEXTROSE IN LACTATED RINGERS 5 % IV SOLN
INTRAVENOUS | Status: DC
  Administered 2019-03-18: 16:00:00 via INTRAVENOUS

## 2019-03-18 MED ORDER — KETOROLAC TROMETHAMINE 30 MG/ML IJ SOLN: 30.0000 mg | Freq: Four times a day (QID) | INTRAMUSCULAR | Status: DC

## 2019-03-18 MED ORDER — ACETAMINOPHEN 500 MG PO TABS
1000.0000 mg | ORAL_TABLET | ORAL | Status: AC
  Administered 2019-03-18: 06:00:00 1000 mg via ORAL

## 2019-03-18 MED ORDER — ENOXAPARIN SODIUM 40 MG/0.4ML ~~LOC~~ SOLN
40.0000 mg | SUBCUTANEOUS | Status: DC
  Administered 2019-03-19 – 2019-03-20 (×2): 40 mg via SUBCUTANEOUS
  Filled 2019-03-18 (×2): qty 0.4

## 2019-03-18 MED ORDER — SOD CITRATE-CITRIC ACID 500-334 MG/5ML PO SOLN
30.0000 mL | ORAL | Status: AC
  Administered 2019-03-18: 06:00:00 30 mL via ORAL
  Filled 2019-03-18: qty 30

## 2019-03-18 MED ORDER — SODIUM CHLORIDE 0.9 % IV SOLN
INTRAVENOUS | Status: DC | PRN
  Administered 2019-03-18: 60 mL

## 2019-03-18 MED ORDER — WHITE PETROLATUM EX OINT
TOPICAL_OINTMENT | CUTANEOUS | Status: AC
  Administered 2019-03-18: 21:00:00 via TOPICAL
  Filled 2019-03-18: qty 28.35

## 2019-03-18 MED ORDER — ONDANSETRON HCL 4 MG/2ML IJ SOLN: 4.0000 mg | Freq: Once | INTRAMUSCULAR | Status: DC | PRN

## 2019-03-18 MED ORDER — HYDROMORPHONE 1 MG/ML IV SOLN
INTRAVENOUS | Status: AC
  Filled 2019-03-18: qty 30

## 2019-03-18 MED ORDER — OXYCODONE HCL 5 MG PO TABS: 5.0000 mg | ORAL_TABLET | Freq: Once | ORAL | Status: DC | PRN

## 2019-03-18 MED ORDER — KETOROLAC TROMETHAMINE 30 MG/ML IJ SOLN
30.0000 mg | Freq: Four times a day (QID) | INTRAMUSCULAR | Status: AC
  Administered 2019-03-18 – 2019-03-19 (×4): 30 mg via INTRAVENOUS
  Filled 2019-03-18 (×4): qty 1

## 2019-03-18 MED ORDER — DARUNAVIR ETHANOLATE 800 MG PO TABS
800.0000 mg | ORAL_TABLET | Freq: Every day | ORAL | Status: DC
  Administered 2019-03-18 – 2019-03-20 (×2): 800 mg via ORAL
  Filled 2019-03-18 (×3): qty 1

## 2019-03-18 MED ORDER — DEXAMETHASONE SODIUM PHOSPHATE 10 MG/ML IJ SOLN
INTRAMUSCULAR | Status: DC | PRN
  Administered 2019-03-18: 10 mg via INTRAVENOUS

## 2019-03-18 MED ORDER — HEMOSTATIC AGENTS (NO CHARGE) OPTIME
TOPICAL | Status: DC | PRN
  Administered 2019-03-18: 1 via TOPICAL

## 2019-03-18 MED ORDER — HYDROCHLOROTHIAZIDE 12.5 MG PO CAPS
12.5000 mg | ORAL_CAPSULE | Freq: Every day | ORAL | Status: DC
  Administered 2019-03-18 – 2019-03-20 (×3): 12.5 mg via ORAL
  Filled 2019-03-18 (×3): qty 1

## 2019-03-18 MED ORDER — STERILE WATER FOR IRRIGATION IR SOLN
Status: DC | PRN
  Administered 2019-03-18: 1000 mL

## 2019-03-18 MED ORDER — MIDAZOLAM HCL 2 MG/2ML IJ SOLN
INTRAMUSCULAR | Status: AC
  Filled 2019-03-18: qty 2

## 2019-03-18 MED ORDER — GABAPENTIN 300 MG PO CAPS
ORAL_CAPSULE | ORAL | Status: AC
  Administered 2019-03-18: 300 mg via ORAL
  Filled 2019-03-18: qty 1

## 2019-03-18 MED ORDER — HYDROMORPHONE 1 MG/ML IV SOLN
INTRAVENOUS | Status: DC
  Administered 2019-03-18: 0.2 mg via INTRAVENOUS
  Administered 2019-03-18: 30 mg via INTRAVENOUS
  Administered 2019-03-18: 1.2 mg via INTRAVENOUS
  Administered 2019-03-18 – 2019-03-19 (×2): 0.2 mg via INTRAVENOUS

## 2019-03-18 MED ORDER — OXYCODONE-ACETAMINOPHEN 5-325 MG PO TABS
1.0000 | ORAL_TABLET | ORAL | Status: DC | PRN
  Administered 2019-03-18 – 2019-03-20 (×5): 2 via ORAL
  Filled 2019-03-18 (×5): qty 2

## 2019-03-18 MED ORDER — KETOROLAC TROMETHAMINE 15 MG/ML IJ SOLN
INTRAMUSCULAR | Status: AC
  Filled 2019-03-18: qty 1

## 2019-03-18 MED ORDER — ELVITEG-COBIC-EMTRICIT-TENOFAF 150-150-200-10 MG PO TABS
1.0000 | ORAL_TABLET | Freq: Every day | ORAL | Status: DC
  Administered 2019-03-18 – 2019-03-20 (×2): 1 via ORAL
  Filled 2019-03-18 (×3): qty 1

## 2019-03-18 MED ORDER — WHITE PETROLATUM EX OINT
TOPICAL_OINTMENT | CUTANEOUS | Status: DC | PRN
  Administered 2019-03-18: 21:00:00 via TOPICAL

## 2019-03-18 MED ORDER — ESMOLOL HCL 100 MG/10ML IV SOLN
INTRAVENOUS | Status: DC | PRN
  Administered 2019-03-18: 20 mg via INTRAVENOUS

## 2019-03-18 MED ORDER — KETOROLAC TROMETHAMINE 15 MG/ML IJ SOLN
15.0000 mg | INTRAMUSCULAR | Status: AC
  Administered 2019-03-18: 30 mg via INTRAVENOUS

## 2019-03-18 MED ORDER — FENTANYL CITRATE (PF) 100 MCG/2ML IJ SOLN: 25.0000 ug | INTRAMUSCULAR | Status: DC | PRN

## 2019-03-18 MED ORDER — SOD CITRATE-CITRIC ACID 500-334 MG/5ML PO SOLN
ORAL | Status: AC
  Administered 2019-03-18: 30 mL via ORAL
  Filled 2019-03-18: qty 15

## 2019-03-18 MED ORDER — ALPRAZOLAM 0.25 MG PO TABS: 0.2500 mg | ORAL_TABLET | Freq: Every day | ORAL | Status: DC | PRN

## 2019-03-18 MED ORDER — ESMOLOL HCL 100 MG/10ML IV SOLN
INTRAVENOUS | Status: AC
  Filled 2019-03-18: qty 10

## 2019-03-18 MED ORDER — VASOPRESSIN 20 UNIT/ML IV SOLN
INTRAVENOUS | Status: DC | PRN
  Administered 2019-03-18: 1 [IU]

## 2019-03-18 MED ORDER — SIMETHICONE 80 MG PO CHEW
80.0000 mg | CHEWABLE_TABLET | Freq: Four times a day (QID) | ORAL | Status: DC | PRN
  Administered 2019-03-19 – 2019-03-20 (×2): 80 mg via ORAL
  Filled 2019-03-18 (×2): qty 1

## 2019-03-18 MED ORDER — ROPIVACAINE HCL 5 MG/ML IJ SOLN
INTRAMUSCULAR | Status: AC
  Filled 2019-03-18: qty 30

## 2019-03-18 MED ORDER — MIDAZOLAM HCL 5 MG/5ML IJ SOLN
INTRAMUSCULAR | Status: DC | PRN
  Administered 2019-03-18: 2 mg via INTRAVENOUS

## 2019-03-18 MED ORDER — DEXAMETHASONE SODIUM PHOSPHATE 10 MG/ML IJ SOLN
INTRAMUSCULAR | Status: AC
  Filled 2019-03-18: qty 1

## 2019-03-18 MED ORDER — CYCLOBENZAPRINE HCL 5 MG PO TABS: 5.0000 mg | ORAL_TABLET | Freq: Three times a day (TID) | ORAL | Status: DC | PRN

## 2019-03-18 MED ORDER — ROCURONIUM BROMIDE 100 MG/10ML IV SOLN
INTRAVENOUS | Status: DC | PRN
  Administered 2019-03-18 (×2): 20 mg via INTRAVENOUS
  Administered 2019-03-18: 60 mg via INTRAVENOUS

## 2019-03-18 MED ORDER — ONDANSETRON HCL 4 MG/2ML IJ SOLN
INTRAMUSCULAR | Status: DC | PRN
  Administered 2019-03-18 (×2): 4 mg via INTRAVENOUS

## 2019-03-18 MED ORDER — PROPOFOL 10 MG/ML IV BOLUS
INTRAVENOUS | Status: DC | PRN
  Administered 2019-03-18: 180 mg via INTRAVENOUS

## 2019-03-18 MED ORDER — GABAPENTIN 300 MG PO CAPS
300.0000 mg | ORAL_CAPSULE | ORAL | Status: AC
  Administered 2019-03-18: 06:00:00 300 mg via ORAL

## 2019-03-18 SURGICAL SUPPLY — 40 items
APL SKNCLS STERI-STRIP NONHPOA (GAUZE/BANDAGES/DRESSINGS) ×1
BENZOIN TINCTURE PRP APPL 2/3 (GAUZE/BANDAGES/DRESSINGS) ×1 IMPLANT
CANISTER SUCT 3000ML PPV (MISCELLANEOUS) ×2 IMPLANT
CLSR STERI-STRIP ANTIMIC 1/2X4 (GAUZE/BANDAGES/DRESSINGS) ×2 IMPLANT
COVER WAND RF STERILE (DRAPES) ×2 IMPLANT
DRAPE WARM FLUID 44X44 (DRAPES) ×1 IMPLANT
DRSG OPSITE POSTOP 4X10 (GAUZE/BANDAGES/DRESSINGS) ×2 IMPLANT
DRSG OPSITE POSTOP 4X12 (GAUZE/BANDAGES/DRESSINGS) ×1 IMPLANT
DURAPREP 26ML APPLICATOR (WOUND CARE) ×2 IMPLANT
ELECT BLADE 4.0 EZ CLEAN MEGAD (MISCELLANEOUS) ×2
ELECTRODE BLDE 4.0 EZ CLN MEGD (MISCELLANEOUS) IMPLANT
GAUZE 4X4 16PLY RFD (DISPOSABLE) ×2 IMPLANT
GAUZE SPONGE 4X4 12PLY STRL (GAUZE/BANDAGES/DRESSINGS) ×1 IMPLANT
GLOVE BIO SURGEON STRL SZ7 (GLOVE) ×2 IMPLANT
GLOVE BIOGEL PI IND STRL 7.0 (GLOVE) ×2 IMPLANT
GLOVE BIOGEL PI INDICATOR 7.0 (GLOVE) ×2
GOWN STRL REUS W/ TWL LRG LVL3 (GOWN DISPOSABLE) ×3 IMPLANT
GOWN STRL REUS W/TWL LRG LVL3 (GOWN DISPOSABLE) ×6
HEMOSTAT ARISTA ABSORB 3G PWDR (HEMOSTASIS) ×1 IMPLANT
HIBICLENS CHG 4% 4OZ BTL (MISCELLANEOUS) ×2 IMPLANT
KIT TURNOVER KIT B (KITS) ×2 IMPLANT
PACK ABDOMINAL GYN (CUSTOM PROCEDURE TRAY) ×2 IMPLANT
PAD ABD 8X10 STRL (GAUZE/BANDAGES/DRESSINGS) ×2 IMPLANT
PAD ARMBOARD 7.5X6 YLW CONV (MISCELLANEOUS) ×2 IMPLANT
PAD OB MATERNITY 4.3X12.25 (PERSONAL CARE ITEMS) ×2 IMPLANT
RETAINER VISCERA MED (MISCELLANEOUS) ×1 IMPLANT
SPECIMEN JAR MEDIUM (MISCELLANEOUS) ×2 IMPLANT
SPONGE LAP 18X18 RF (DISPOSABLE) ×3 IMPLANT
SUT PDS AB 0 CTX 60 (SUTURE) IMPLANT
SUT PLAIN 2 0 (SUTURE)
SUT PLAIN 2 0 XLH (SUTURE) ×1 IMPLANT
SUT PLAIN ABS 2-0 CT1 27XMFL (SUTURE) IMPLANT
SUT VIC AB 0 CT1 18XCR BRD8 (SUTURE) ×2 IMPLANT
SUT VIC AB 0 CT1 27 (SUTURE) ×10
SUT VIC AB 0 CT1 27XBRD ANBCTR (SUTURE) ×2 IMPLANT
SUT VIC AB 0 CT1 8-18 (SUTURE) ×6
SUT VIC AB 2-0 CT1 (SUTURE) ×2 IMPLANT
SUT VIC AB 4-0 KS 27 (SUTURE) ×2 IMPLANT
TOWEL GREEN STERILE FF (TOWEL DISPOSABLE) ×4 IMPLANT
TRAY FOLEY W/BAG SLVR 14FR (SET/KITS/TRAYS/PACK) ×2 IMPLANT

## 2019-03-18 NOTE — Op Note (Signed)
03/18/2019  9:52 AM  PATIENT:  [name]  43 y.o. female  PRE-OPERATIVE DIAGNOSIS:  fibroids  POST-OPERATIVE DIAGNOSIS:  FIBROIDS   PROCEDURE:  Procedure(s): HYSTERECTOMY ABDOMINAL WITH BILATERAL SALPINGECTOMY (N/A)  SURGEON:  Surgeon(s) and Role:    * Bobbye Charleston, MD - Primary    * Jerelyn Charles, MD - Assisting  ANESTHESIA:   general  EBL:  700 mL   LOCAL MEDICATIONS USED:  OTHER Ropivicaine and Arista  SPECIMEN:  Source of Specimen:  uterus, cervix, bilateral tubes, fibroid- total weight 1300 gm  DISPOSITION OF SPECIMEN:  PATHOLOGY  COUNTS:  YES  TOURNIQUET:  * No tourniquets in log *  DICTATION: .Note written in EPIC  PLAN OF CARE: Admit to inpatient   PATIENT DISPOSITION:  PACU - hemodynamically stable.   Delay start of Pharmacological VTE agent (>24hrs) due to surgical blood loss or risk of bleeding: no  Complications: none.  Findings: 1300 gram uterus with multiple fibroids.  Ovaries and tubes normal.  Ureters were identified outside of the operating field multiple times.    Technique:  After adequate general anesthesia was achieved, the patient was prepped and draped in the usual sterile fashion after the foley had been placed.  A Pfannenstiel incision was made with the scalpel and carried down to the fascia.  The fascia was incised with the scalpel in a transverse manner and extended in a transverse curvilinear manner.  The rectus muscles were split in the midline and a bowel free portion of the peritoneum was tented up.  It was entered into with the hemostat and extended in a superior and inferior manner with good visualization of the bowel and bladder.  In order to achieve more room with the incision, the muscle on the L was cut midway after placing hemostasis sutures with 0 monocryl above and below the cut. The top fibroid of the uterus was removed from the abdominal cavity and the balfour retractor was placed after the bowel was packed away and  the bladder retracted.   The uterus was still socked in pelvis but the tubes and ovaries were all freely mobile.  The round ligaments were suture ligated with 0-vicryl bilaterally and incised with the bovie.  The bovie was then used to incise a transverse curvilinear incision in the vesico-cervical fascia. The incisions were extended to parallel the IP ligaments and the uterine arteries were skeletonized.  The mesosalpinx bilaterally was incised with the bovie cautery and removed from the ovaries.  The uterine-ovarian ligaments bilaterally were then clamped with heaney clamps and incised with the mayo scissors.  Each pedicle was then secured with a free tie and then a suture ligation.   Pitressin was injected into the bottom of the top fibroid and the fibroid was removed with sharp and blunt dissection with the cautery.  The bed of the fibroid was hemostatic and the uterine body with it's large fibroid was then grasped and able to be removed from the abdominal cavity.  The fibroid was in the main body of the uterus and the cervix was found to be thin and long.    The bladder flap was then retracted inferiorly with blunt and cautery dissection below the external cervical os.  The uterine arteries were clamped bilaterally with the heaneys and incised with the mayos. The uterine body was then amputated from the cervix with the bovie cautery.  Each pedicle under the heineys were then secured with a stitch of 0-vicryl.  A second bite with the  straight heaney was done bilaterally and secured with 0-vicryl.  The cervical stump was held with kochers.  The cardinal ligament was then divided with alternating successive bites of the straight heaney, incised with the scalpel and then secured with 0-vicryl.  At the level of the reflection of the vagina onto the cervix, two curved heaneys were placed and the cervix removed with the jorgensen scissors.  The cuff angles were then secured with 0-vicryl and the cuff was closed  with an interrupted figure of eight stitch of 0-vicryl.  Hemostasis was insured and the ureters were peristalsing well out of the field of dissection.  Irrigation was performed and hemostasis was assured.  Arista was placed in pelvis at this time in case of any oozing vessels not seen.  Ropivicaine then was poured into pelvis.   The instruments and laps were removed from the abdominal cavity and the muscle incision  re-approximated together with ) vicryl. The peritoneum was closed with a running stitch of 2-vicryl which incorporated the rectus muscles as a separate layer. The fascia was closed with 0-vicryl in a running manner.  The subcutaneous tissue was closed with interrupted stitches of 3-0 plain gut after hemostasis was achieved with the bovie and irrigation.  The skin was closed with 3-0 vicryl sub-q with a keith needle and dermabond.  Pt tolerated the procedure well and was transferred to the recovery room in stable condition.

## 2019-03-18 NOTE — Transfer of Care (Signed)
Immediate Anesthesia Transfer of Care Note  Patient: [name]  Procedure(s) Performed: HYSTERECTOMY ABDOMINAL WITH BILATERAL SALPINGECTOMY (N/A Abdomen)  Patient Location: PACU  Anesthesia Type:General  Level of Consciousness: drowsy and patient cooperative  Airway & Oxygen Therapy: Patient Spontanous Breathing and Patient connected to face mask oxygen  Post-op Assessment: Report given to RN and Post -op Vital signs reviewed and stable  Post vital signs: Reviewed and stable  Last Vitals:  Vitals Value Taken Time  BP 124/72 03/18/19 1007  Temp    Pulse 82 03/18/19 1009  Resp 13 03/18/19 1009  SpO2 94 % 03/18/19 1009  Vitals shown include unvalidated device data.  Last Pain:  Vitals:   03/18/19 0612  TempSrc:   PainSc: 0-No pain      Patients Stated Pain Goal: 3 ([id number])  Complications: No apparent anesthesia complications

## 2019-03-18 NOTE — Progress Notes (Signed)
There has been no change in the patients history, status or exam since the history and physical.  Vitals:   03/18/19 0558  BP: 136/86  Pulse: 72  Resp: 17  Temp: 98.2 F (36.8 C)  TempSrc: Oral  SpO2: 100%  Weight: 129.3 kg  Height: 5\' 10"  (1.778 m)    Results for orders placed or performed during the hospital encounter of 03/18/19 (from the past 72 hour(s))  Pregnancy, urine POC     Status: None   Collection Time: 03/18/19  6:28 AM  Result Value Ref Range   Preg Test, Ur NEGATIVE NEGATIVE    Comment:        THE SENSITIVITY OF THIS METHODOLOGY IS >24 mIU/mL     Daria Pastures

## 2019-03-18 NOTE — Brief Op Note (Signed)
03/18/2019  9:52 AM  PATIENT:  [name]  43 y.o. female  PRE-OPERATIVE DIAGNOSIS:  fibroids  POST-OPERATIVE DIAGNOSIS:  FIBROIDS   PROCEDURE:  Procedure(s): HYSTERECTOMY ABDOMINAL WITH BILATERAL SALPINGECTOMY (N/A)  SURGEON:  Surgeon(s) and Role:    * Bobbye Charleston, MD - Primary    * Jerelyn Charles, MD - Assisting  ANESTHESIA:   general  EBL:  700 mL   LOCAL MEDICATIONS USED:  OTHER Ropivicaine and Arista  SPECIMEN:  Source of Specimen:  uterus, cervix, bilateral tubes, fibroid- total weight 1300 gm  DISPOSITION OF SPECIMEN:  PATHOLOGY  COUNTS:  YES  TOURNIQUET:  * No tourniquets in log *  DICTATION: .Note written in EPIC  PLAN OF CARE: Admit to inpatient   PATIENT DISPOSITION:  PACU - hemodynamically stable.   Delay start of Pharmacological VTE agent (>24hrs) due to surgical blood loss or risk of bleeding: no

## 2019-03-18 NOTE — Anesthesia Procedure Notes (Signed)
Procedure Name: Intubation Date/Time: 03/18/2019 7:24 AM Performed by: Gwyndolyn Saxon, CRNA Pre-anesthesia Checklist: Patient identified, Emergency Drugs available, Suction available and Patient being monitored Patient Re-evaluated:Patient Re-evaluated prior to induction Oxygen Delivery Method: Circle system utilized Preoxygenation: Pre-oxygenation with 100% oxygen Induction Type: IV induction Ventilation: Mask ventilation without difficulty Laryngoscope Size: Miller and 3 Grade View: Grade I Tube type: Oral Tube size: 7.0 mm Number of attempts: 1 Airway Equipment and Method: Patient positioned with wedge pillow and Stylet Placement Confirmation: ETT inserted through vocal cords under direct vision,  positive ETCO2 and breath sounds checked- equal and bilateral Secured at: 20 cm Tube secured with: Tape Dental Injury: Teeth and Oropharynx as per pre-operative assessment

## 2019-03-18 NOTE — Anesthesia Preprocedure Evaluation (Signed)
Anesthesia Evaluation  Patient identified by MRN, date of birth, ID band Patient awake    Reviewed: Allergy & Precautions, NPO status , Patient's Chart, lab work & pertinent test results  Airway Mallampati: II  TM Distance: >3 FB Neck ROM: Full    Dental  (+) Teeth Intact, Dental Advisory Given   Pulmonary    breath sounds clear to auscultation       Cardiovascular  Rhythm:Regular Rate:Normal     Neuro/Psych    GI/Hepatic   Endo/Other    Renal/GU      Musculoskeletal   Abdominal   Peds  Hematology   Anesthesia Other Findings   Reproductive/Obstetrics                             Anesthesia Physical Anesthesia Plan  ASA: III  Anesthesia Plan: General   Post-op Pain Management:    Induction: Intravenous  PONV Risk Score and Plan: Ondansetron and Dexamethasone  Airway Management Planned:   Additional Equipment:   Intra-op Plan:   Post-operative Plan: Extubation in OR  Informed Consent: I have reviewed the patients History and Physical, chart, labs and discussed the procedure including the risks, benefits and alternatives for the proposed anesthesia with the patient or authorized representative who has indicated his/her understanding and acceptance.     Dental advisory given  Plan Discussed with: CRNA and Anesthesiologist  Anesthesia Plan Comments:         Anesthesia Quick Evaluation

## 2019-03-18 NOTE — Anesthesia Postprocedure Evaluation (Signed)
Anesthesia Post Note  Patient: [name]  Procedure(s) Performed: HYSTERECTOMY ABDOMINAL WITH BILATERAL SALPINGECTOMY (N/A Abdomen)     Patient location during evaluation: PACU Anesthesia Type: General Level of consciousness: awake and alert Pain management: pain level controlled Vital Signs Assessment: post-procedure vital signs reviewed and stable Respiratory status: spontaneous breathing, nonlabored ventilation, respiratory function stable and patient connected to nasal cannula oxygen Cardiovascular status: blood pressure returned to baseline and stable Postop Assessment: no apparent nausea or vomiting Anesthetic complications: no    Last Vitals:  Vitals:   03/18/19 1222 03/18/19 1252  BP: 122/67 124/76  Pulse: 83 65  Resp: 18 19  Temp:  36.7 C  SpO2: 99% 96%    Last Pain:  Vitals:   03/18/19 1424  TempSrc:   PainSc: 4                  Aleea Hendry COKER

## 2019-03-19 ENCOUNTER — Encounter (HOSPITAL_COMMUNITY): Payer: Self-pay | Admitting: Obstetrics and Gynecology

## 2019-03-19 ENCOUNTER — Other Ambulatory Visit: Payer: Self-pay | Admitting: Internal Medicine

## 2019-03-19 DIAGNOSIS — B20 Human immunodeficiency virus [HIV] disease: Secondary | ICD-10-CM

## 2019-03-19 LAB — SURGICAL PATHOLOGY

## 2019-03-19 MED ORDER — IBUPROFEN 800 MG PO TABS: 800.0000 mg | ORAL_TABLET | Freq: Four times a day (QID) | ORAL | 0 refills | Status: DC

## 2019-03-19 MED ORDER — OXYCODONE-ACETAMINOPHEN 5-325 MG PO TABS: 1.0000 | ORAL_TABLET | ORAL | 0 refills | Status: DC | PRN

## 2019-03-19 NOTE — Progress Notes (Signed)
Pt. ambulated around the unit 1 time with the assistance of this RN. Pt. tolerated well. Will continue to monitor.

## 2019-03-19 NOTE — Progress Notes (Signed)
Patient is eating, ambulating, and voiding.  Pain control is good.  Small amount of blood from vagina but not active.   Vitals:   03/18/19 2353 03/19/19 0215 03/19/19 0415 03/19/19 0629  BP:  133/77  120/66  Pulse:  73  (!) 58  Resp: 17 18 18 18   Temp:  97.9 F (36.6 C)  98.1 F (36.7 C)  TempSrc:  Oral  Oral  SpO2: 98% 98% 98% 100%  Weight:      Height:        lungs:   clear to auscultation cor:    RRR Abdomen:  soft, appropriate tenderness, incisions intact and without erythema or exudate. ex:    no cords   Results for orders placed or performed during the hospital encounter of 03/18/19 (from the past 24 hour(s))  Comprehensive metabolic panel     Status: Abnormal   Collection Time: 03/18/19  3:51 PM  Result Value Ref Range   Sodium 138 135 - 145 mmol/L   Potassium 4.1 3.5 - 5.1 mmol/L   Chloride 101 98 - 111 mmol/L   CO2 26 22 - 32 mmol/L   Glucose, Bld 150 (H) 70 - 99 mg/dL   BUN 10 6 - 20 mg/dL   Creatinine, Ser 0.89 0.44 - 1.00 mg/dL   Calcium 8.9 8.9 - 10.3 mg/dL   Total Protein 7.0 6.5 - 8.1 g/dL   Albumin 3.8 3.5 - 5.0 g/dL   AST 20 15 - 41 U/L   ALT 19 0 - 44 U/L   Alkaline Phosphatase 66 38 - 126 U/L   Total Bilirubin 0.6 0.3 - 1.2 mg/dL   GFR calc non Af Amer >60 >60 mL/min   GFR calc Af Amer >60 >60 mL/min   Anion gap 11 5 - 15    A/P  Routine care.  Expect d/c per plan.  CBC P.

## 2019-03-19 NOTE — Discharge Summary (Signed)
Physician Discharge Summary  Patient ID: [name] MRN: [id number] DOB/AGE: [birth year] 43 y.o.  Admit date: 03/18/2019 Discharge date: 03/19/2019  Admission Diagnoses:Symptomatic fibroid uterus.  Discharge Diagnoses:  Active Problems:   Postoperative state   Discharged Condition: good  Hospital Course: Uncomplicated TAH/salpingectomies.  Pt ambulating and tolerating PO on PO 1.  Consults: None  Significant Diagnostic Studies: labs: Cr post op 0.89- lower than preop.   Treatments: surgery: TAH/salpingectomies  Discharge Exam: Blood pressure 120/66, pulse (!) 58, temperature 98.1 F (36.7 C), temperature source Oral, resp. rate 18, height 5\' 10"  (1.778 m), weight 128.8 kg, last menstrual period 02/28/2019, SpO2 100 %.  Disposition:    Allergies as of 03/19/2019   No Known Allergies     Medication List    STOP taking these medications   naproxen 500 MG tablet Commonly known as: NAPROSYN     TAKE these medications   ALPRAZolam 0.25 MG tablet Commonly known as: XANAX Take 0.25 mg by mouth daily as needed for anxiety.   aspirin EC 81 MG tablet Take 81 mg by mouth every other day.   cyclobenzaprine 5 MG tablet Commonly known as: FLEXERIL Take 5 mg by mouth 3 (three) times daily as needed for muscle spasms.   Genvoya 150-150-200-10 MG Tabs tablet Generic drug: elvitegravir-cobicistat-emtricitabine-tenofovir TAKE 1 TABLET BY MOUTH DAILY   hydrochlorothiazide 12.5 MG capsule Commonly known as: MICROZIDE Take 1 capsule (12.5 mg total) by mouth daily.   ibuprofen 800 MG tablet Commonly known as: ADVIL Take 1 tablet (800 mg total) by mouth every 6 (six) hours.   ondansetron 4 MG tablet Commonly known as: Zofran Take 1 tablet (4 mg total) by mouth every 8 (eight) hours as needed for nausea or vomiting.   oxyCODONE-acetaminophen 5-325 MG tablet Commonly known as: PERCOCET/ROXICET Take 1-2 tablets by mouth every 4 (four) hours as needed for moderate pain.   Prezista 800 MG tablet Generic drug: darunavir TAKE 1 TABLET BY MOUTH EVERY DAY      Follow-up Information    Bobbye Charleston, MD Follow up in 3 week(s).   Specialty: Obstetrics and Gynecology Contact information: [address] [place] [address] [phone]           Signed: TIYE JIN 03/19/2019, 7:45 AM

## 2019-03-19 NOTE — Plan of Care (Signed)
  Problem: Education: Goal: Knowledge of General Education information will improve Description: Including pain rating scale, medication(s)/side effects and non-pharmacologic comfort measures 03/19/2019 0035 by Forestine Chute, RN Outcome: Progressing 03/19/2019 0034 by Forestine Chute, RN Outcome: Progressing   Problem: Education: Goal: Required Educational Video(s) Outcome: Progressing   Problem: Clinical Measurements: Goal: Ability to maintain clinical measurements within normal limits will improve Outcome: Progressing Goal: Postoperative complications will be avoided or minimized Outcome: Progressing   Problem: Skin Integrity: Goal: Demonstration of wound healing without infection will improve Outcome: Progressing

## 2019-03-20 LAB — CBC
HCT: 32.5 % — ABNORMAL LOW (ref 36.0–46.0)
Hemoglobin: 10.6 g/dL — ABNORMAL LOW (ref 12.0–15.0)
MCH: 30.4 pg (ref 26.0–34.0)
MCHC: 32.6 g/dL (ref 30.0–36.0)
MCV: 93.1 fL (ref 80.0–100.0)
Platelets: 270 10*3/uL (ref 150–400)
RBC: 3.49 MIL/uL — ABNORMAL LOW (ref 3.87–5.11)
RDW: 13.3 % (ref 11.5–15.5)
WBC: 16.1 10*3/uL — ABNORMAL HIGH (ref 4.0–10.5)
nRBC: 0 % (ref 0.0–0.2)

## 2019-03-20 NOTE — Progress Notes (Signed)
Patient is eating, ambulating, and voiding.  Pain control is good.  Vitals:   03/19/19 1100 03/19/19 1428 03/19/19 2223 03/20/19 0521  BP: (!) 109/54 112/68 114/68 137/60  Pulse: 75 81 70 60  Resp: 18 18 18 18   Temp: 98.5 F (36.9 C) 98.3 F (36.8 C) 98.2 F (36.8 C) 98.3 F (36.8 C)  TempSrc: Oral Oral Oral Oral  SpO2: 100% 100% 100% 100%  Weight:      Height:        lungs:   clear to auscultation cor:    RRR Abdomen:  soft, appropriate tenderness, incisions intact and without erythema or exudate. ex:    no cords   Lab Results  Component Value Date   WBC 16.1 (H) 03/20/2019   HGB 10.6 (L) 03/20/2019   HCT 32.5 (L) 03/20/2019   MCV 93.1 03/20/2019   PLT 270 03/20/2019    A/P  Routine care.  Expect d/c per plan.

## 2019-06-13 DIAGNOSIS — B009 Herpesviral infection, unspecified: Secondary | ICD-10-CM | POA: Diagnosis not present

## 2019-06-13 DIAGNOSIS — G4700 Insomnia, unspecified: Secondary | ICD-10-CM | POA: Diagnosis not present

## 2019-06-13 DIAGNOSIS — F411 Generalized anxiety disorder: Secondary | ICD-10-CM | POA: Diagnosis not present

## 2019-07-23 ENCOUNTER — Other Ambulatory Visit: Payer: Self-pay

## 2019-07-23 DIAGNOSIS — B20 Human immunodeficiency virus [HIV] disease: Secondary | ICD-10-CM

## 2019-07-23 MED ORDER — DARUNAVIR ETHANOLATE 800 MG PO TABS: 800.0000 mg | ORAL_TABLET | Freq: Every day | ORAL | 4 refills | Status: DC

## 2019-07-23 MED ORDER — GENVOYA 150-150-200-10 MG PO TABS: 1.0000 | ORAL_TABLET | Freq: Every day | ORAL | 4 refills | Status: DC

## 2019-08-20 ENCOUNTER — Other Ambulatory Visit: Payer: Medicaid Other

## 2019-08-21 ENCOUNTER — Other Ambulatory Visit: Payer: Self-pay

## 2019-08-21 ENCOUNTER — Other Ambulatory Visit: Payer: Medicaid Other

## 2019-08-21 DIAGNOSIS — B20 Human immunodeficiency virus [HIV] disease: Secondary | ICD-10-CM

## 2019-08-22 LAB — T-HELPER CELL (CD4) - (RCID CLINIC ONLY)
CD4 % Helper T Cell: 25 % — ABNORMAL LOW (ref 33–65)
CD4 T Cell Abs: 507 /uL (ref 400–1790)

## 2019-08-23 LAB — HIV-1 RNA QUANT-NO REFLEX-BLD
HIV 1 RNA Quant: <20 copies/mL
HIV-1 RNA Quant, Log: <1.3 Log copies/mL

## 2019-09-03 ENCOUNTER — Other Ambulatory Visit: Payer: Self-pay

## 2019-09-03 ENCOUNTER — Encounter: Payer: Self-pay | Admitting: Internal Medicine

## 2019-09-03 ENCOUNTER — Telehealth (INDEPENDENT_AMBULATORY_CARE_PROVIDER_SITE_OTHER): Payer: Managed Care, Other (non HMO) | Admitting: Internal Medicine

## 2019-09-03 ENCOUNTER — Encounter: Payer: Medicaid Other | Admitting: Internal Medicine

## 2019-09-03 DIAGNOSIS — R599 Enlarged lymph nodes, unspecified: Secondary | ICD-10-CM

## 2019-09-03 DIAGNOSIS — Z113 Encounter for screening for infections with a predominantly sexual mode of transmission: Secondary | ICD-10-CM | POA: Diagnosis not present

## 2019-09-03 DIAGNOSIS — B20 Human immunodeficiency virus [HIV] disease: Secondary | ICD-10-CM

## 2019-09-03 NOTE — Assessment & Plan Note (Signed)
No new swelling of her neck at this time.

## 2019-09-03 NOTE — Assessment & Plan Note (Addendum)
Doing well and no concerns.  We did discuss her CD4 count which is a bit lower than last time but is well in the normal range and fluctuations are expected and ok as long as viral load remains suppressed.   20 minutes spent on video visit

## 2019-09-03 NOTE — Progress Notes (Addendum)
I connected with  [name] on 09/03/19 by a video enabled telemedicine application and verified that I am speaking with the correct person using two identifiers.   I discussed the limitations of evaluation and management by telemedicine. The patient expressed understanding and agreed to proceed.  HPI: she continues on Genvoya and prezista and no missed doses.  CD4 stable at 507 and viral load < 20.  No new issues.  Did get both vaccines for COVID-19.  No complaints.  No associated n/v/d.    ROS:  Constitutional: no fatigue GI: no diarreha, no n/v Skin: no rashes  PE General: alert Pulmonary: speaking in full sentences Neuro: oriented

## 2019-09-26 IMAGING — CR DG KNEE COMPLETE 4+V*R*
4 series · 4 of 4 positions shown · non-contrast
Comparison: None in PACs

CLINICAL DATA: Missed step today and fell injuring the right ankle
and twisting the right knee.

EXAM:
RIGHT KNEE - COMPLETE 4+ VIEW

[t knee ap right]
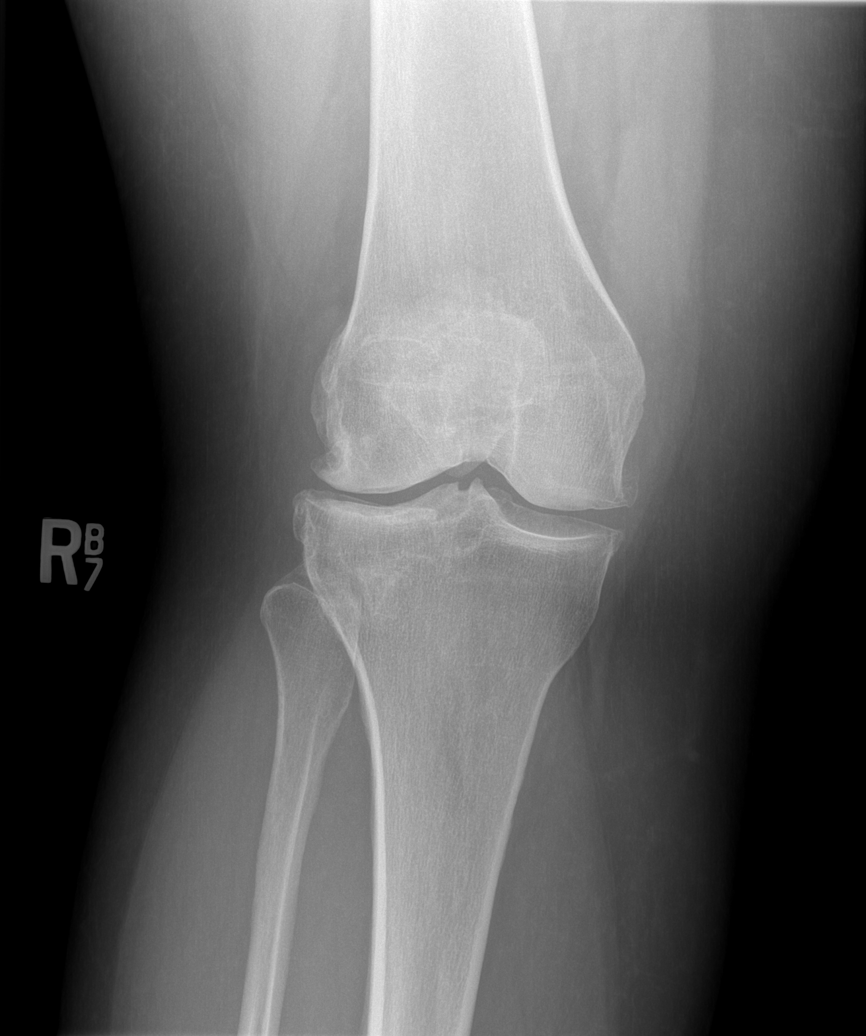

[t knee oblique right (1 of 2)]
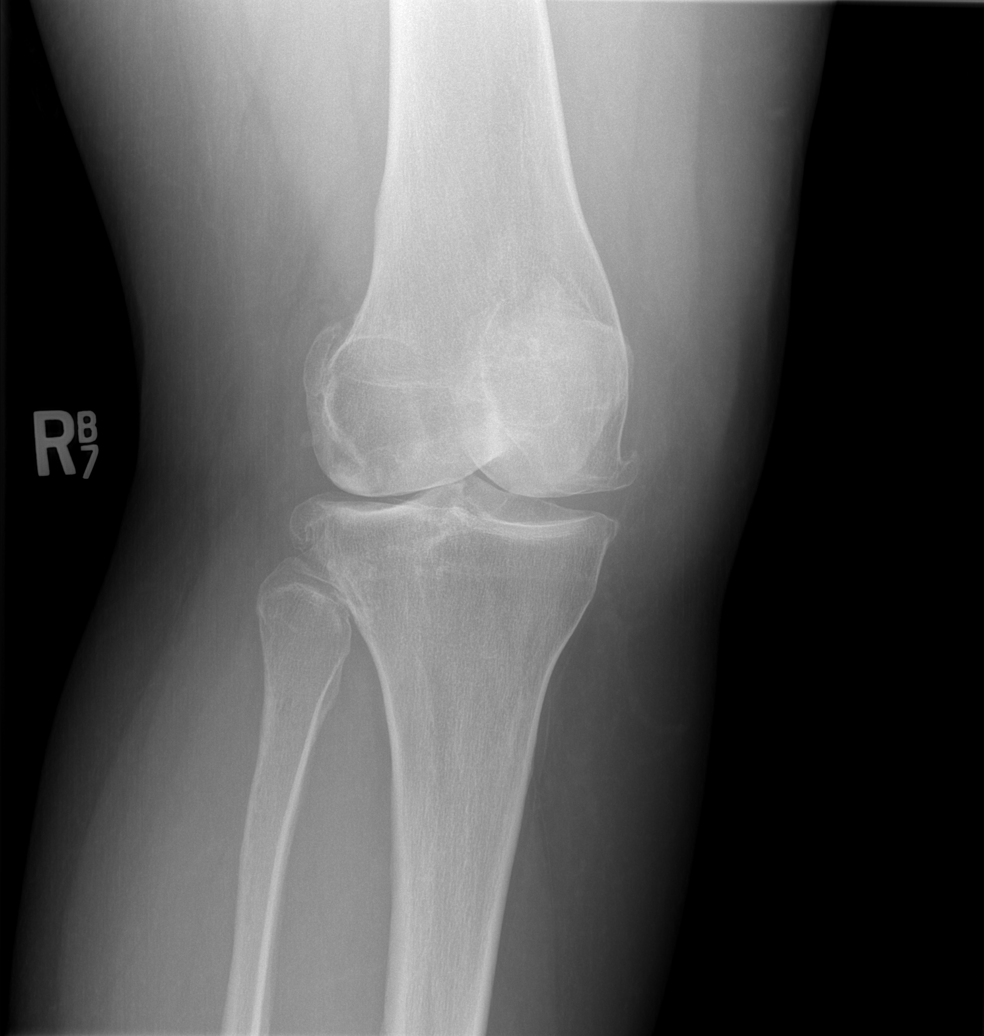

[t knee oblique right (2 of 2)]
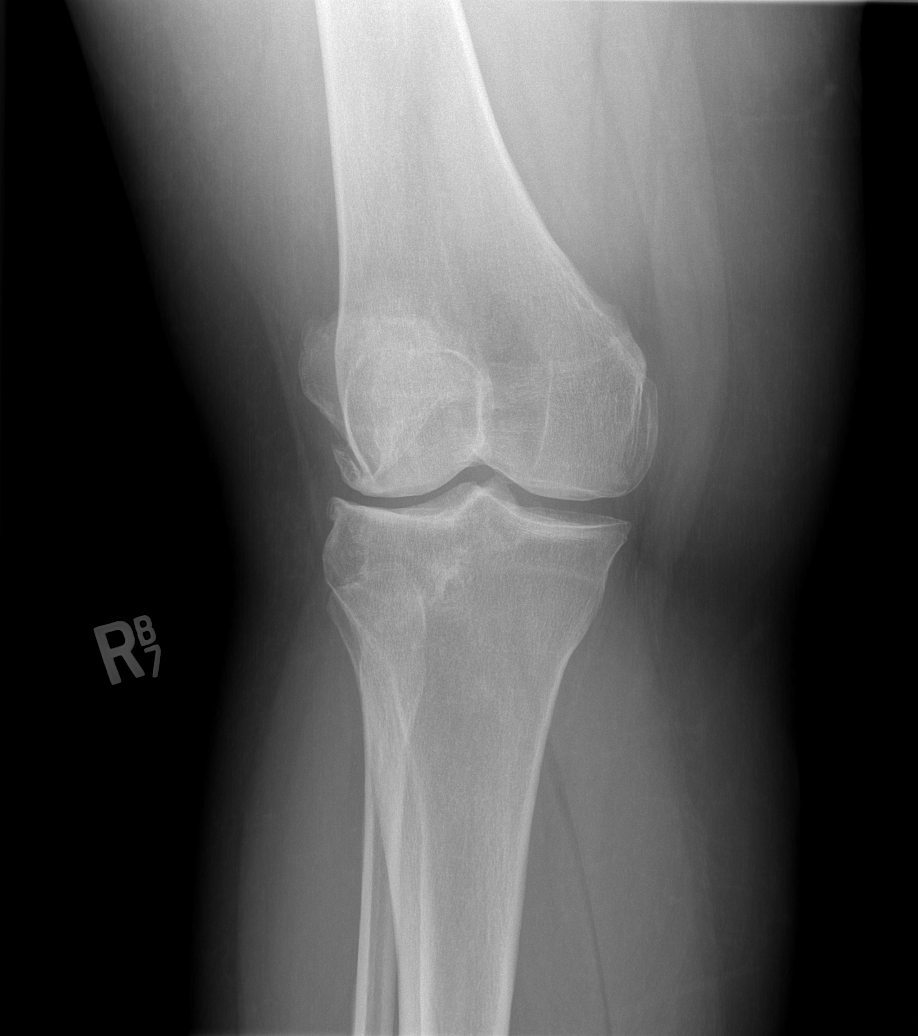

[t knee lat right]
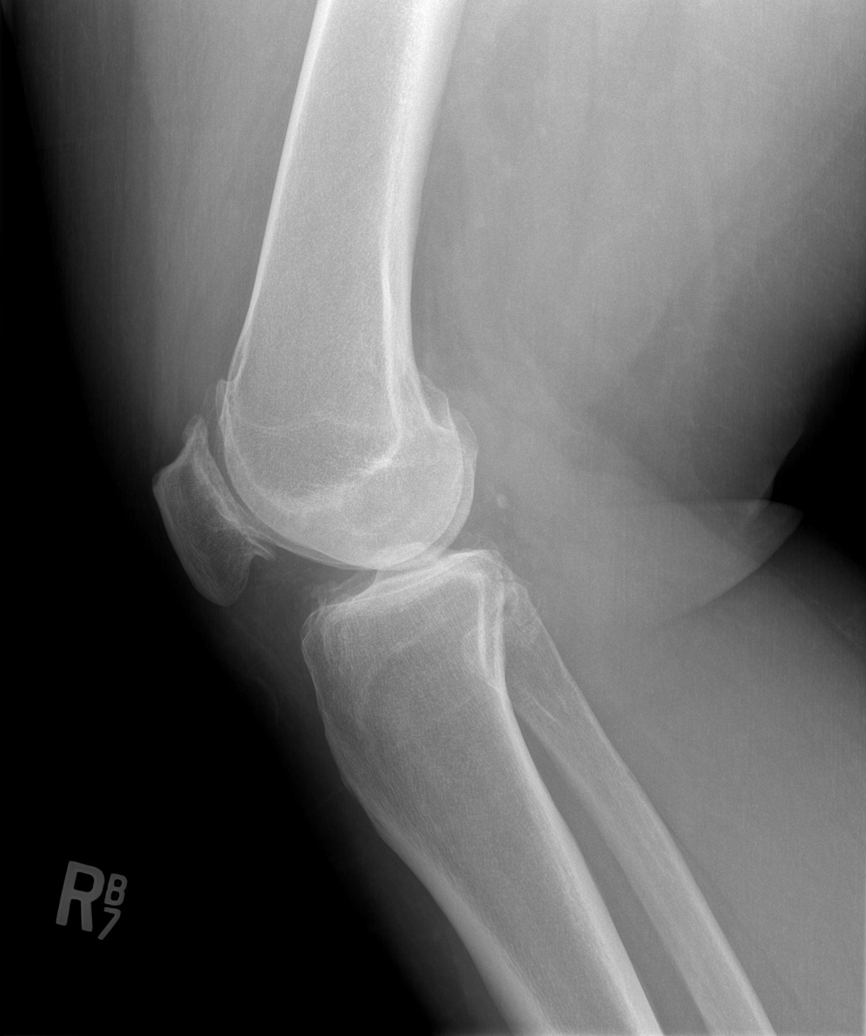

[4 of 4 positions shown; findings below may reference images not displayed]

FINDINGS: The bones are subjectively adequately mineralized. There is mild
loss of the lateral joint space. There is mild beaking of the medial
tibial spine. Spurs arise from the lateral articular margin of the
lateral femoral condyle and lateral tibial plateau. There is no
tibial plateau fracture. Spurs arise from the articular margins of
the patella. There is a small suprapatellar effusion. There is no
acute fracture or dislocation.
IMPRESSION: Moderate degenerative change of the lateral joint compartment.
Milder degenerative changes of the patellofemoral compartment. No
acute bony abnormality.

## 2019-10-15 DIAGNOSIS — J0140 Acute pansinusitis, unspecified: Secondary | ICD-10-CM | POA: Diagnosis not present

## 2019-10-15 DIAGNOSIS — J40 Bronchitis, not specified as acute or chronic: Secondary | ICD-10-CM | POA: Diagnosis not present

## 2020-01-08 ENCOUNTER — Other Ambulatory Visit: Payer: Self-pay | Admitting: Internal Medicine

## 2020-01-08 DIAGNOSIS — B20 Human immunodeficiency virus [HIV] disease: Secondary | ICD-10-CM

## 2020-02-25 ENCOUNTER — Other Ambulatory Visit: Payer: Medicaid Other

## 2020-02-25 ENCOUNTER — Other Ambulatory Visit: Payer: Self-pay

## 2020-02-25 ENCOUNTER — Other Ambulatory Visit (HOSPITAL_COMMUNITY)
Admission: RE | Admit: 2020-02-25 | Discharge: 2020-02-25 | Disposition: A | Discharge status: Home / Self Care | Payer: Medicaid Other | Source: Ambulatory Visit | Attending: Internal Medicine | Admitting: Internal Medicine

## 2020-02-25 DIAGNOSIS — Z113 Encounter for screening for infections with a predominantly sexual mode of transmission: Secondary | ICD-10-CM

## 2020-02-25 DIAGNOSIS — B20 Human immunodeficiency virus [HIV] disease: Secondary | ICD-10-CM | POA: Diagnosis not present

## 2020-02-26 DIAGNOSIS — Z0000 Encounter for general adult medical examination without abnormal findings: Secondary | ICD-10-CM | POA: Diagnosis not present

## 2020-02-26 DIAGNOSIS — Z6839 Body mass index (BMI) 39.0-39.9, adult: Secondary | ICD-10-CM | POA: Diagnosis not present

## 2020-02-26 LAB — T-HELPER CELL (CD4) - (RCID CLINIC ONLY)
CD4 % Helper T Cell: 30 % — ABNORMAL LOW (ref 33–65)
CD4 T Cell Abs: 680 /uL (ref 400–1790)

## 2020-02-27 LAB — URINE CYTOLOGY ANCILLARY ONLY
Chlamydia: NEGATIVE
Comment: NEGATIVE
Comment: NORMAL
Neisseria Gonorrhea: NEGATIVE

## 2020-02-28 LAB — HIV-1 RNA QUANT-NO REFLEX-BLD
HIV 1 RNA Quant: <20 Copies/mL
HIV-1 RNA Quant, Log: <1.3 Log cps/mL

## 2020-02-28 LAB — RPR: RPR Ser Ql: NONREACTIVE

## 2020-03-10 ENCOUNTER — Telehealth (INDEPENDENT_AMBULATORY_CARE_PROVIDER_SITE_OTHER): Payer: Medicaid Other | Admitting: Internal Medicine

## 2020-03-10 ENCOUNTER — Encounter: Payer: Self-pay | Admitting: Internal Medicine

## 2020-03-10 ENCOUNTER — Other Ambulatory Visit: Payer: Self-pay

## 2020-03-10 DIAGNOSIS — Z79899 Other long term (current) drug therapy: Secondary | ICD-10-CM | POA: Diagnosis not present

## 2020-03-10 DIAGNOSIS — Z113 Encounter for screening for infections with a predominantly sexual mode of transmission: Secondary | ICD-10-CM

## 2020-03-10 DIAGNOSIS — B20 Human immunodeficiency virus [HIV] disease: Secondary | ICD-10-CM

## 2020-03-10 NOTE — Progress Notes (Signed)
I connected with  [name] on 03/10/20 by phone and verified that I am speaking with the correct person using two identifiers.   I discussed the limitations of evaluation and management by telemedicine. The patient expressed understanding and agreed to proceed.  Location:  Physician - clinic exam room Patient - home  HPI: she continues on Genvoya and prezista and no issues.  No missed doses.  CD4 of 680 and viral load < 20.  No complaints today.  Is up to date with PAP smear.    ROS:  Constitutional: no fatigue GI: no diarreha, no n/v Skin: no rashes  PE General: alert Pulmonary: speaking in full sentences Neuro: oriented

## 2020-03-10 NOTE — Assessment & Plan Note (Signed)
Doing well on her salvage regimen, no issues.  rtc 6 months  10 minutes spent on the visit in discussion of results

## 2020-03-15 NOTE — Progress Notes (Signed)
It is already in the A/P

## 2020-05-12 DIAGNOSIS — Z20822 Contact with and (suspected) exposure to covid-19: Secondary | ICD-10-CM | POA: Diagnosis not present

## 2020-07-01 ENCOUNTER — Other Ambulatory Visit: Payer: Self-pay | Admitting: Internal Medicine

## 2020-07-01 DIAGNOSIS — B20 Human immunodeficiency virus [HIV] disease: Secondary | ICD-10-CM

## 2020-07-13 ENCOUNTER — Telehealth: Payer: Self-pay | Admitting: *Deleted

## 2020-07-13 NOTE — Telephone Encounter (Signed)
Patient with positive at-home covid test 3/22. Contacted her PCP, was written out of work. She returns to work 3/30 per her PCP. She is ok with returning to work tomorrow.  Per primary care doctor, she has been taking mucinex or tylenol sinus x 1 week.  Patient's symptoms not really better, now include nasal congestion, diarrhea, headache, productive cough with clear phlegm. No fever, no runny nose. PCP advised her to contact Dr Linus Salmons in case we had any different advice for symptom management.   She is asking if there is anything else she should take for symptoms? Landis Gandy, RN

## 2020-07-13 NOTE — Telephone Encounter (Signed)
I do not have anything to add to that, it sounds right. thanks

## 2020-07-15 NOTE — Telephone Encounter (Signed)
Left voicemail with Dr Henreitta Leber advice. Thanks!

## 2020-07-30 IMAGING — CR CHEST - 2 VIEW
2 series · 2 of 2 positions shown · non-contrast
Comparison: Chest CT 11/10/2017

CLINICAL DATA: Chest pain

EXAM:
CHEST - 2 VIEW

[w chest pa]
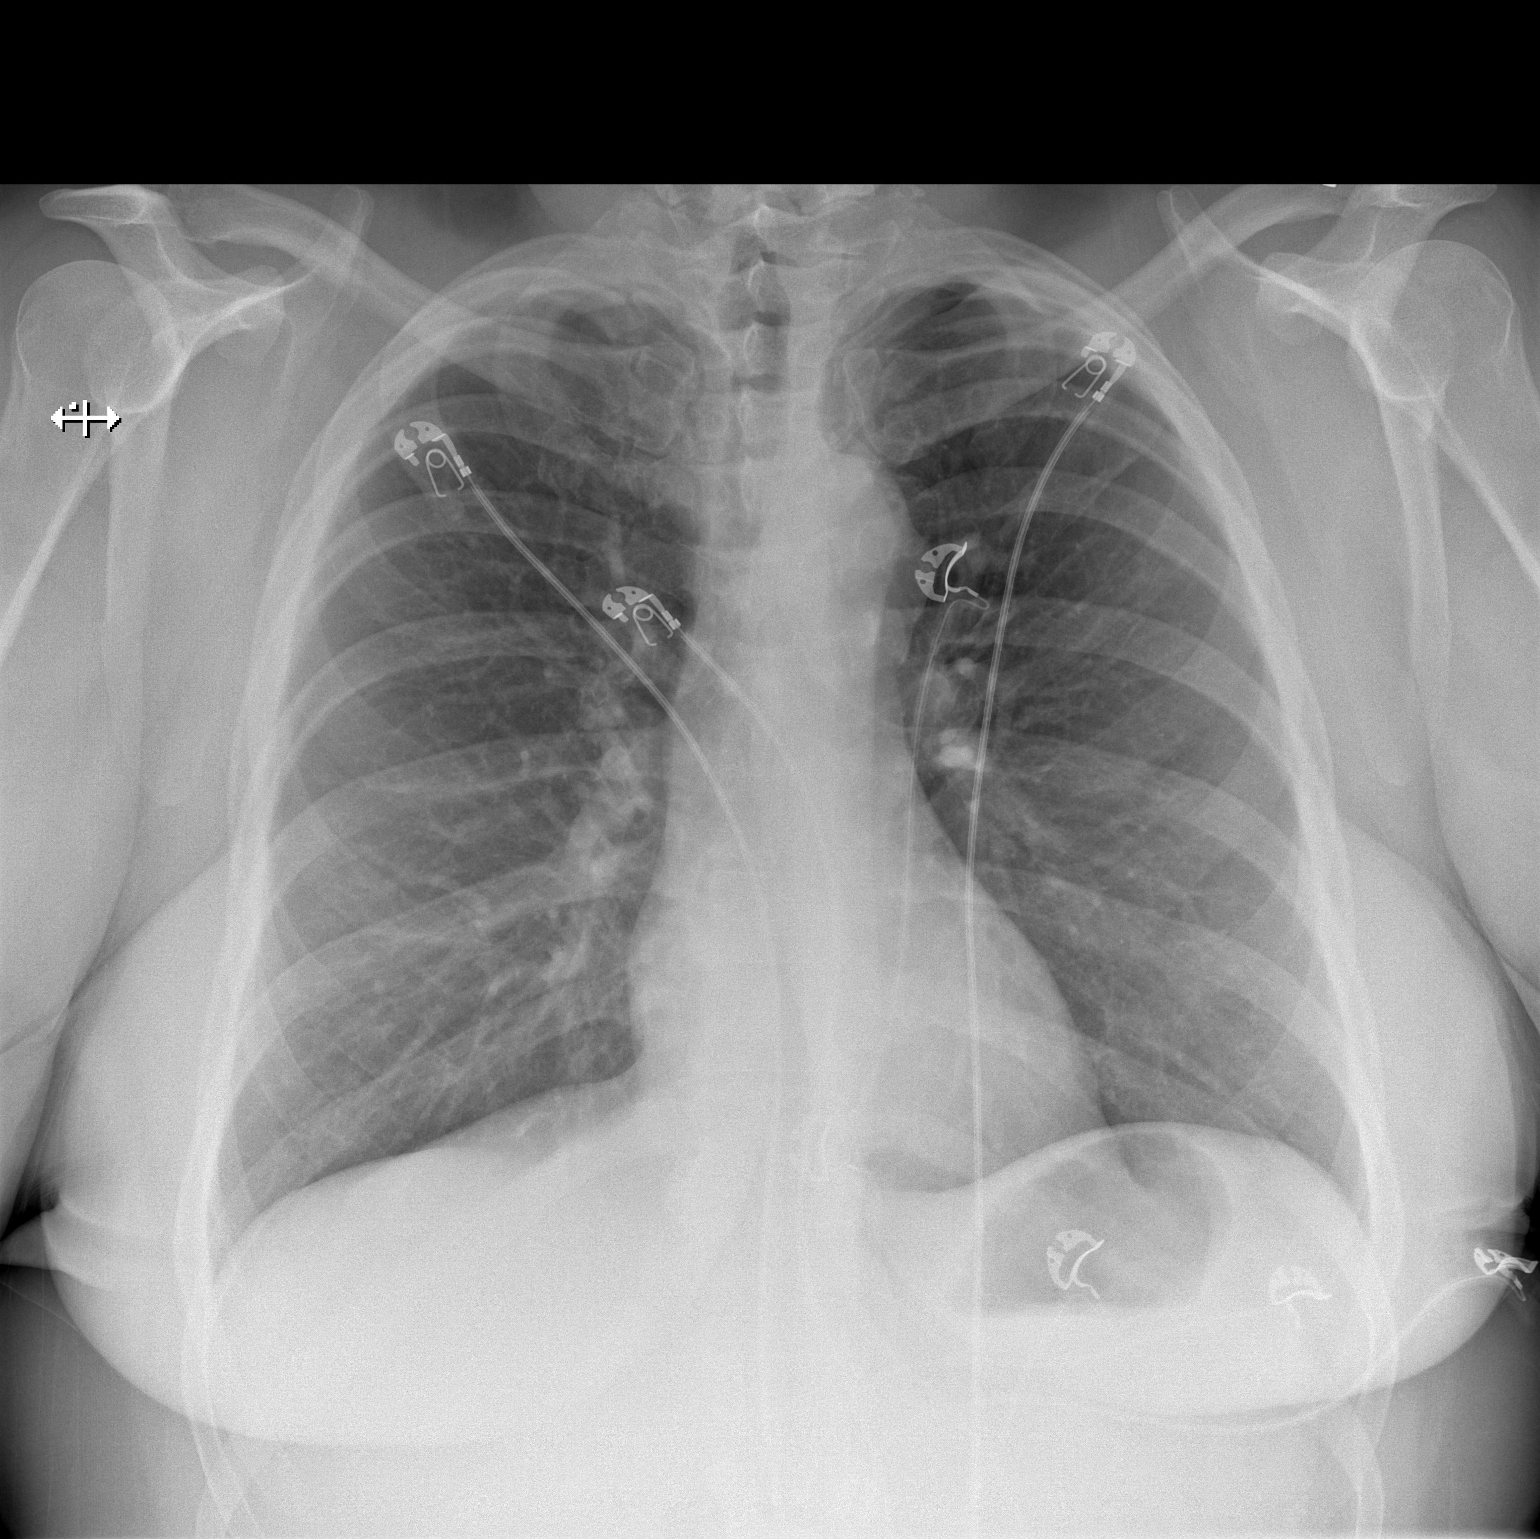

[w chest lat]
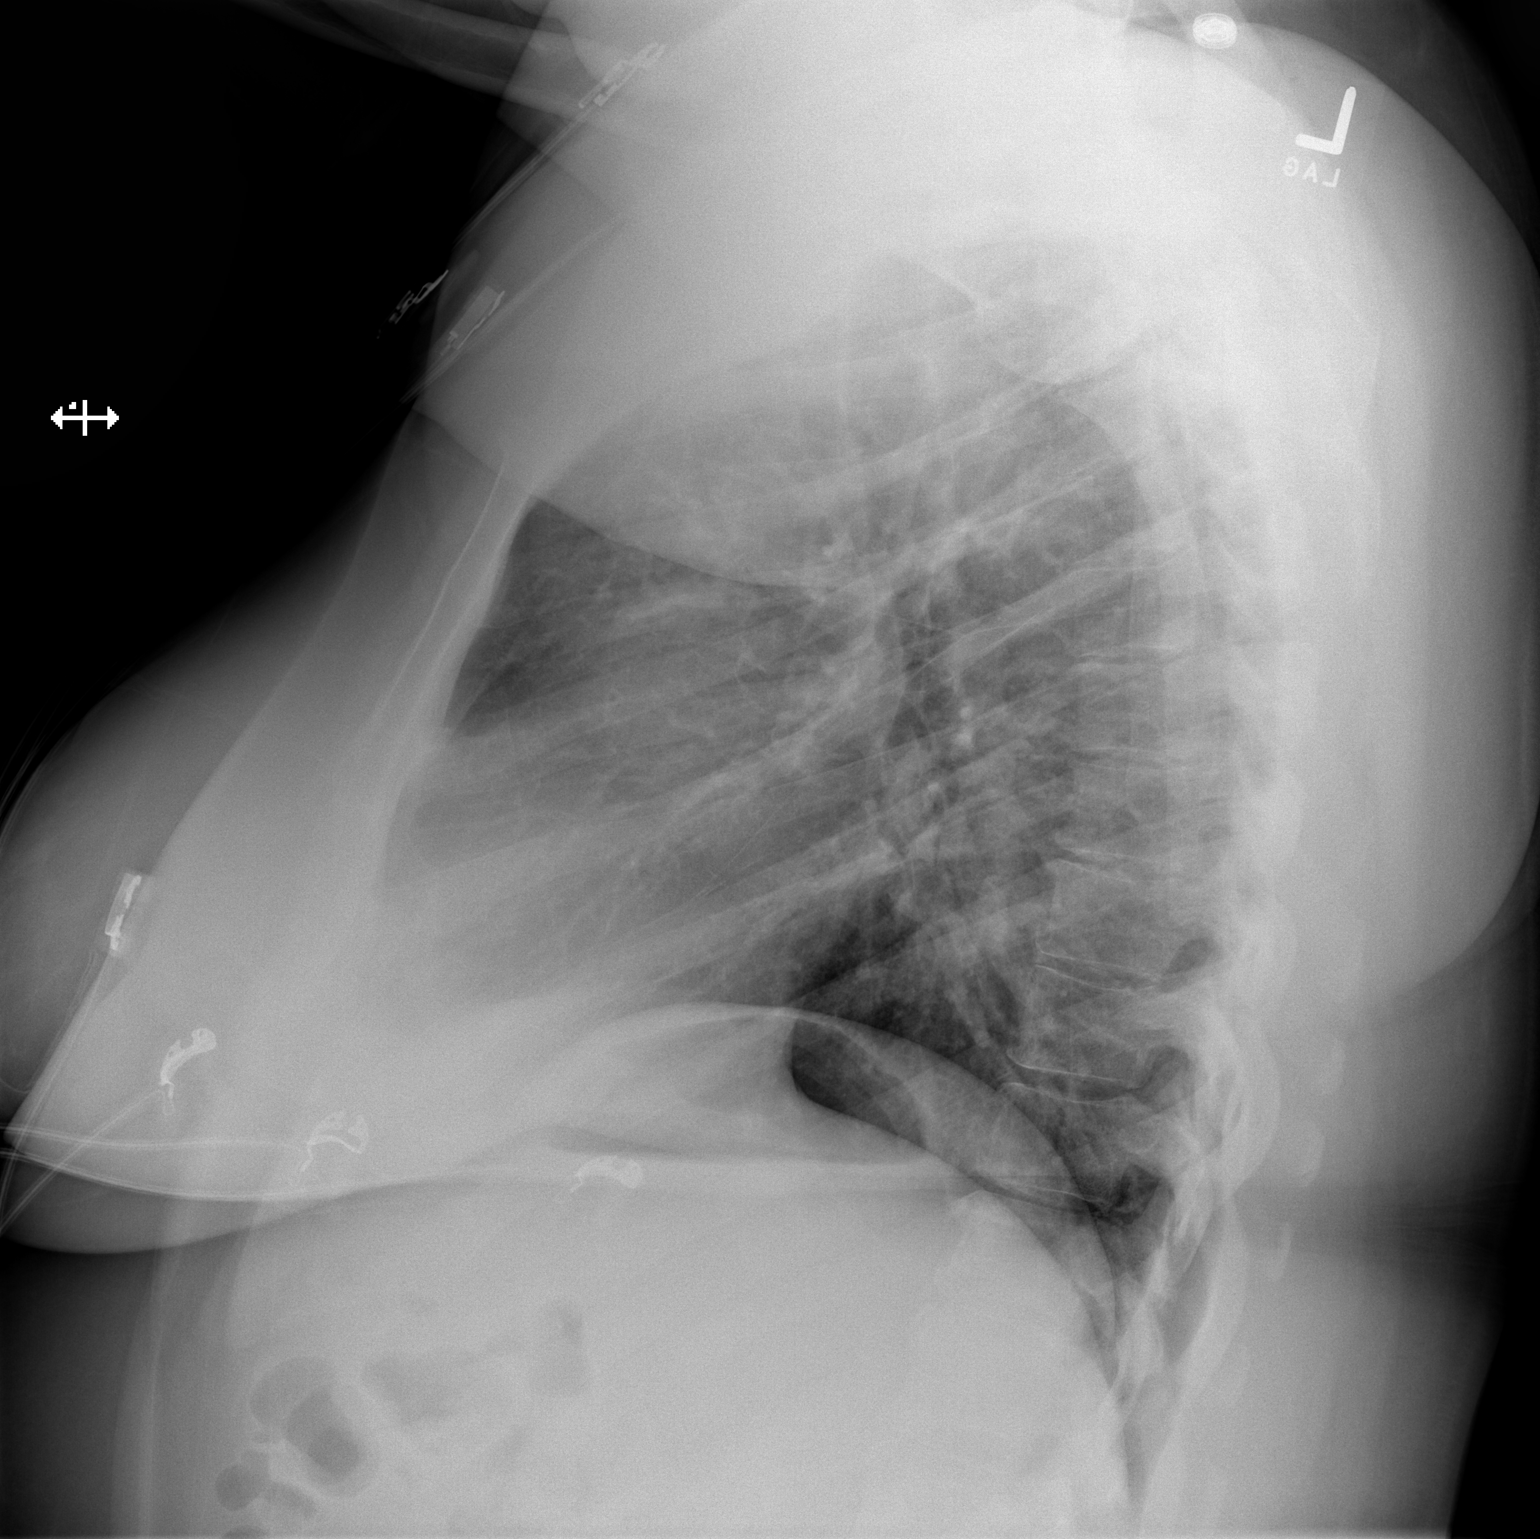

[2 of 2 positions shown; findings below may reference images not displayed]

FINDINGS: The heart size and mediastinal contours are within normal limits.
Both lungs are clear. The visualized skeletal structures are
unremarkable.
IMPRESSION: Clear lungs.

## 2020-09-21 ENCOUNTER — Other Ambulatory Visit: Payer: PRIVATE HEALTH INSURANCE

## 2020-09-21 ENCOUNTER — Other Ambulatory Visit: Payer: Self-pay

## 2020-09-21 DIAGNOSIS — Z113 Encounter for screening for infections with a predominantly sexual mode of transmission: Secondary | ICD-10-CM

## 2020-09-21 DIAGNOSIS — B20 Human immunodeficiency virus [HIV] disease: Secondary | ICD-10-CM

## 2020-09-21 DIAGNOSIS — Z79899 Other long term (current) drug therapy: Secondary | ICD-10-CM

## 2020-09-22 LAB — T-HELPER CELL (CD4) - (RCID CLINIC ONLY)
CD4 % Helper T Cell: 28 % — ABNORMAL LOW (ref 33–65)
CD4 T Cell Abs: 598 /uL (ref 400–1790)

## 2020-09-23 LAB — CBC WITH DIFFERENTIAL/PLATELET
Absolute Monocytes: 496 cells/uL (ref 200–950)
Basophils Absolute: 81 cells/uL (ref 0–200)
Basophils Relative: 1.1 %
Eosinophils Absolute: 266 cells/uL (ref 15–500)
Eosinophils Relative: 3.6 %
HCT: 41 % (ref 35.0–45.0)
Hemoglobin: 13.9 g/dL (ref 11.7–15.5)
Lymphs Abs: 2235 cells/uL (ref 850–3900)
MCH: 30.5 pg (ref 27.0–33.0)
MCHC: 33.9 g/dL (ref 32.0–36.0)
MCV: 90.1 fL (ref 80.0–100.0)
MPV: 9.9 fL (ref 7.5–12.5)
Monocytes Relative: 6.7 %
Neutro Abs: 4322 cells/uL (ref 1500–7800)
Neutrophils Relative %: 58.4 %
Platelets: 315 10*3/uL (ref 140–400)
RBC: 4.55 10*6/uL (ref 3.80–5.10)
RDW: 13 % (ref 11.0–15.0)
Total Lymphocyte: 30.2 %
WBC: 7.4 10*3/uL (ref 3.8–10.8)

## 2020-09-23 LAB — COMPLETE METABOLIC PANEL WITH GFR
AG Ratio: 1.5 (calc) (ref 1.0–2.5)
ALT: 16 U/L (ref 6–29)
AST: 14 U/L (ref 10–30)
Albumin: 3.8 g/dL (ref 3.6–5.1)
Alkaline phosphatase (APISO): 83 U/L (ref 31–125)
BUN: 12 mg/dL (ref 7–25)
CO2: 25 mmol/L (ref 20–32)
Calcium: 8.8 mg/dL (ref 8.6–10.2)
Chloride: 106 mmol/L (ref 98–110)
Creat: 0.75 mg/dL (ref 0.50–1.10)
GFR, Est African American: 112 mL/min/{1.73_m2} (ref >=60)
GFR, Est Non African American: 97 mL/min/{1.73_m2} (ref >=60)
Globulin: 2.6 g/dL (calc) (ref 1.9–3.7)
Glucose, Bld: 105 mg/dL — ABNORMAL HIGH (ref 65–99)
Potassium: 4.7 mmol/L (ref 3.5–5.3)
Sodium: 140 mmol/L (ref 135–146)
Total Bilirubin: 0.3 mg/dL (ref 0.2–1.2)
Total Protein: 6.4 g/dL (ref 6.1–8.1)

## 2020-09-23 LAB — LIPID PANEL
Cholesterol: 201 mg/dL — ABNORMAL HIGH (ref <200)
HDL: 51 mg/dL (ref >=50)
LDL Cholesterol (Calc): 129 mg/dL (calc) — ABNORMAL HIGH
Non-HDL Cholesterol (Calc): 150 mg/dL (calc) — ABNORMAL HIGH (ref <130)
Total CHOL/HDL Ratio: 3.9 (calc) (ref <5.0)
Triglycerides: 99 mg/dL (ref <150)

## 2020-09-23 LAB — HIV-1 RNA QUANT-NO REFLEX-BLD
HIV 1 RNA Quant: NOT DETECTED Copies/mL
HIV-1 RNA Quant, Log: NOT DETECTED Log cps/mL

## 2020-09-23 LAB — RPR: RPR Ser Ql: NONREACTIVE

## 2020-10-01 ENCOUNTER — Other Ambulatory Visit: Payer: Self-pay | Admitting: Internal Medicine

## 2020-10-01 DIAGNOSIS — B20 Human immunodeficiency virus [HIV] disease: Secondary | ICD-10-CM

## 2020-10-06 ENCOUNTER — Other Ambulatory Visit: Payer: Self-pay

## 2020-10-06 ENCOUNTER — Encounter: Payer: Self-pay | Admitting: Internal Medicine

## 2020-10-06 ENCOUNTER — Telehealth (INDEPENDENT_AMBULATORY_CARE_PROVIDER_SITE_OTHER): Payer: PRIVATE HEALTH INSURANCE | Admitting: Internal Medicine

## 2020-10-06 DIAGNOSIS — B20 Human immunodeficiency virus [HIV] disease: Secondary | ICD-10-CM | POA: Diagnosis not present

## 2020-10-06 MED ORDER — GENVOYA 150-150-200-10 MG PO TABS: 1.0000 | ORAL_TABLET | Freq: Every day | ORAL | 11 refills | Status: DC

## 2020-10-06 MED ORDER — DARUNAVIR 800 MG PO TABS: ORAL_TABLET | ORAL | 11 refills | Status: DC

## 2020-10-06 NOTE — Assessment & Plan Note (Signed)
She continues to do well on her regimen and no indication for any changes.  She will continue and can rtc in 6 months.

## 2020-10-06 NOTE — Progress Notes (Signed)
   Subjective:    Patient ID: [name], female    DOB: 09/07/1975, 45 y.o.   MRN: [id number]  HPI I connected with  [name] on 10/06/20 by phone and verified that I am speaking with the correct person using two identifiers.   I discussed the limitations of evaluation and management by telemedicine. The patient expressed understanding and agreed to proceed.  Location: Patient - home Physician - clinic  Duration of visit:  10 minutes  HPI: she is called for follow up of HIV She continues on [place] and denies any missed doses.   CD4 598, viral load not detected.  Creat, LFTs wnl.     Review of Systems  Constitutional:  Negative for fatigue.  Gastrointestinal:  Negative for diarrhea.  Skin:  Negative for rash.      Objective:   Physical Exam Pulmonary:     Effort: Pulmonary effort is normal.  Neurological:     Mental Status: She is alert.  Psychiatric:        Mood and Affect: Mood normal.          Assessment & Plan:

## 2021-02-21 ENCOUNTER — Ambulatory Visit
Admission: EM | Admit: 2021-02-21 | Discharge: 2021-02-21 | Disposition: A | Discharge status: Home / Self Care | Payer: No Typology Code available for payment source | Attending: Emergency Medicine | Admitting: Emergency Medicine

## 2021-02-21 ENCOUNTER — Other Ambulatory Visit: Payer: Self-pay

## 2021-02-21 DIAGNOSIS — S39012A Strain of muscle, fascia and tendon of lower back, initial encounter: Secondary | ICD-10-CM | POA: Diagnosis not present

## 2021-02-21 DIAGNOSIS — M62838 Other muscle spasm: Secondary | ICD-10-CM

## 2021-02-21 MED ORDER — KETOROLAC TROMETHAMINE 60 MG/2ML IM SOLN
60.0000 mg | Freq: Once | INTRAMUSCULAR | Status: AC
  Administered 2021-02-21: 60 mg via INTRAMUSCULAR

## 2021-02-21 MED ORDER — BACLOFEN 10 MG PO TABS: 10.0000 mg | ORAL_TABLET | Freq: Every day | ORAL | 0 refills | Status: AC

## 2021-02-21 MED ORDER — TRAMADOL HCL 50 MG PO TABS
50.0000 mg | ORAL_TABLET | Freq: Four times a day (QID) | ORAL | 0 refills | Status: AC | PRN
Start: 2021-02-21 — End: 2021-02-24
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
## 2021-02-21 NOTE — Discharge Instructions (Addendum)
You received an injection of ketorolac in the office today for immediate pain relief.  I have sent a prescription for tramadol to your pharmacy, you can take 1 tablet up to 4 times daily for pain relief.  I have also prescribed a muscle relaxer called baclofen, please take 1 tablet daily at bedtime.  You are welcome to intersperse ibuprofen with the tramadol, they are safe to take together you may find that the combination of the 2 provides more relief than tramadol alone.  Please follow-up with either your primary care provider or with urgent care in the next 3 to 5 days if you do not have improvement of your symptoms.
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
## 2021-02-23 ENCOUNTER — Ambulatory Visit (HOSPITAL_BASED_OUTPATIENT_CLINIC_OR_DEPARTMENT_OTHER)
Admission: RE | Admit: 2021-02-23 | Discharge: 2021-02-23 | Disposition: A | Discharge status: Home / Self Care | Payer: No Typology Code available for payment source | Source: Ambulatory Visit | Attending: Emergency Medicine | Admitting: Emergency Medicine

## 2021-02-23 ENCOUNTER — Other Ambulatory Visit: Payer: Self-pay

## 2021-02-23 DIAGNOSIS — M47816 Spondylosis without myelopathy or radiculopathy, lumbar region: Secondary | ICD-10-CM | POA: Insufficient documentation

## 2021-02-23 DIAGNOSIS — M5489 Other dorsalgia: Secondary | ICD-10-CM | POA: Diagnosis not present

## 2021-02-23 DIAGNOSIS — S3992XA Unspecified injury of lower back, initial encounter: Secondary | ICD-10-CM | POA: Diagnosis present

## 2021-03-29 ENCOUNTER — Other Ambulatory Visit: Payer: No Typology Code available for payment source

## 2021-03-29 ENCOUNTER — Other Ambulatory Visit: Payer: Self-pay

## 2021-03-29 DIAGNOSIS — B20 Human immunodeficiency virus [HIV] disease: Secondary | ICD-10-CM

## 2021-03-30 ENCOUNTER — Other Ambulatory Visit: Payer: PRIVATE HEALTH INSURANCE

## 2021-03-30 LAB — T-HELPER CELL (CD4) - (RCID CLINIC ONLY)
CD4 % Helper T Cell: 27 % — ABNORMAL LOW (ref 33–65)
CD4 T Cell Abs: 454 /uL (ref 400–1790)

## 2021-03-31 LAB — HIV-1 RNA QUANT-NO REFLEX-BLD
HIV 1 RNA Quant: NOT DETECTED Copies/mL
HIV-1 RNA Quant, Log: NOT DETECTED Log cps/mL

## 2021-04-13 ENCOUNTER — Other Ambulatory Visit: Payer: Self-pay

## 2021-04-13 ENCOUNTER — Telehealth (INDEPENDENT_AMBULATORY_CARE_PROVIDER_SITE_OTHER): Payer: No Typology Code available for payment source | Admitting: Internal Medicine

## 2021-04-13 ENCOUNTER — Encounter: Payer: Self-pay | Admitting: Internal Medicine

## 2021-04-13 DIAGNOSIS — B20 Human immunodeficiency virus [HIV] disease: Secondary | ICD-10-CM

## 2021-04-13 DIAGNOSIS — Z79899 Other long term (current) drug therapy: Secondary | ICD-10-CM | POA: Diagnosis not present

## 2021-04-13 DIAGNOSIS — Z113 Encounter for screening for infections with a predominantly sexual mode of transmission: Secondary | ICD-10-CM | POA: Diagnosis not present

## 2021-04-13 DIAGNOSIS — Z5181 Encounter for therapeutic drug level monitoring: Secondary | ICD-10-CM

## 2021-04-13 NOTE — Assessment & Plan Note (Signed)
She continues to do well with no concerns and discussed the medications.  No changes and will rtc in 6 months.

## 2021-04-13 NOTE — Progress Notes (Signed)
° °  Subjective:    Patient ID: [name], female    DOB: October 22, 1975, 45 y.o.   MRN: [id number]  HPI I connected with  KYMIAH ARAIZA on 04/13/21 by video and verified that I am speaking with the correct person using two identifiers.   I discussed the limitations of evaluation and management by telemedicine. The patient expressed understanding and agreed to proceed.  Location: Patient - home Physician - clinic  Duration of visit:  12 minutes  HPI: called for follow up of HIV She continues on Genvoya and Prezista and has had no missed doses.  CD4 454 and viral load remains not detected.  No complaints today.    Review of Systems  Constitutional:  Negative for activity change.  Skin:  Negative for rash.      Objective:   Physical Exam Pulmonary:     Effort: Pulmonary effort is normal.  Neurological:     Mental Status: She is alert.  Psychiatric:        Thought Content: Thought content normal.          Assessment & Plan:

## 2021-09-23 ENCOUNTER — Other Ambulatory Visit (HOSPITAL_COMMUNITY)
Admission: RE | Admit: 2021-09-23 | Discharge: 2021-09-23 | Disposition: A | Discharge status: Home / Self Care | Payer: Medicaid Other | Source: Ambulatory Visit | Attending: Internal Medicine | Admitting: Internal Medicine

## 2021-09-23 ENCOUNTER — Other Ambulatory Visit: Payer: No Typology Code available for payment source

## 2021-09-23 ENCOUNTER — Other Ambulatory Visit: Payer: Self-pay | Admitting: Internal Medicine

## 2021-09-23 ENCOUNTER — Other Ambulatory Visit: Payer: Self-pay

## 2021-09-23 DIAGNOSIS — Z79899 Other long term (current) drug therapy: Secondary | ICD-10-CM

## 2021-09-23 DIAGNOSIS — B20 Human immunodeficiency virus [HIV] disease: Secondary | ICD-10-CM

## 2021-09-23 DIAGNOSIS — Z113 Encounter for screening for infections with a predominantly sexual mode of transmission: Secondary | ICD-10-CM | POA: Insufficient documentation

## 2021-09-26 ENCOUNTER — Other Ambulatory Visit: Payer: Medicaid Other

## 2021-09-26 LAB — URINE CYTOLOGY ANCILLARY ONLY
Chlamydia: NEGATIVE
Comment: NEGATIVE
Comment: NORMAL
Neisseria Gonorrhea: NEGATIVE

## 2021-09-27 LAB — LIPID PANEL
Cholesterol: 183 mg/dL (ref <200)
HDL: 47 mg/dL — ABNORMAL LOW (ref >=50)
LDL Cholesterol (Calc): 118 mg/dL (calc) — ABNORMAL HIGH
Non-HDL Cholesterol (Calc): 136 mg/dL (calc) — ABNORMAL HIGH (ref <130)
Total CHOL/HDL Ratio: 3.9 (calc) (ref <5.0)
Triglycerides: 84 mg/dL (ref <150)

## 2021-09-27 LAB — COMPLETE METABOLIC PANEL WITH GFR
AG Ratio: 1.4 (calc) (ref 1.0–2.5)
ALT: 12 U/L (ref 6–29)
AST: 14 U/L (ref 10–35)
Albumin: 3.8 g/dL (ref 3.6–5.1)
Alkaline phosphatase (APISO): 85 U/L (ref 31–125)
BUN: 10 mg/dL (ref 7–25)
CO2: 26 mmol/L (ref 20–32)
Calcium: 9.2 mg/dL (ref 8.6–10.2)
Chloride: 107 mmol/L (ref 98–110)
Creat: 0.82 mg/dL (ref 0.50–0.99)
Globulin: 2.7 g/dL (calc) (ref 1.9–3.7)
Glucose, Bld: 117 mg/dL — ABNORMAL HIGH (ref 65–99)
Potassium: 4.3 mmol/L (ref 3.5–5.3)
Sodium: 140 mmol/L (ref 135–146)
Total Bilirubin: 0.4 mg/dL (ref 0.2–1.2)
Total Protein: 6.5 g/dL (ref 6.1–8.1)
eGFR: 90 mL/min/{1.73_m2} (ref >=60)

## 2021-09-27 LAB — CBC WITH DIFFERENTIAL/PLATELET
Absolute Monocytes: 379 cells/uL (ref 200–950)
Basophils Absolute: 40 cells/uL (ref 0–200)
Basophils Relative: 0.5 %
Eosinophils Absolute: 190 cells/uL (ref 15–500)
Eosinophils Relative: 2.4 %
HCT: 40.1 % (ref 35.0–45.0)
Hemoglobin: 13.3 g/dL (ref 11.7–15.5)
Lymphs Abs: 1785 cells/uL (ref 850–3900)
MCH: 29.8 pg (ref 27.0–33.0)
MCHC: 33.2 g/dL (ref 32.0–36.0)
MCV: 89.7 fL (ref 80.0–100.0)
MPV: 10.1 fL (ref 7.5–12.5)
Monocytes Relative: 4.8 %
Neutro Abs: 5506 cells/uL (ref 1500–7800)
Neutrophils Relative %: 69.7 %
Platelets: 306 10*3/uL (ref 140–400)
RBC: 4.47 10*6/uL (ref 3.80–5.10)
RDW: 12.7 % (ref 11.0–15.0)
Total Lymphocyte: 22.6 %
WBC: 7.9 10*3/uL (ref 3.8–10.8)

## 2021-09-27 LAB — T-HELPER CELLS (CD4) COUNT (NOT AT ARMC)
Absolute CD4: 530 cells/uL (ref 490–1740)
CD4 T Helper %: 29 % — ABNORMAL LOW (ref 30–61)
Total lymphocyte count: 1822 cells/uL (ref 850–3900)

## 2021-09-27 LAB — HIV-1 RNA QUANT-NO REFLEX-BLD
HIV 1 RNA Quant: NOT DETECTED Copies/mL
HIV-1 RNA Quant, Log: NOT DETECTED Log cps/mL

## 2021-09-27 LAB — RPR: RPR Ser Ql: NONREACTIVE

## 2021-10-07 ENCOUNTER — Telehealth (INDEPENDENT_AMBULATORY_CARE_PROVIDER_SITE_OTHER): Payer: No Typology Code available for payment source | Admitting: Internal Medicine

## 2021-10-07 ENCOUNTER — Other Ambulatory Visit: Payer: Self-pay

## 2021-10-07 ENCOUNTER — Encounter: Payer: Self-pay | Admitting: Internal Medicine

## 2021-10-07 DIAGNOSIS — Z113 Encounter for screening for infections with a predominantly sexual mode of transmission: Secondary | ICD-10-CM | POA: Diagnosis not present

## 2021-10-07 DIAGNOSIS — B20 Human immunodeficiency virus [HIV] disease: Secondary | ICD-10-CM | POA: Diagnosis not present

## 2021-10-07 DIAGNOSIS — Z79899 Other long term (current) drug therapy: Secondary | ICD-10-CM | POA: Diagnosis not present

## 2021-10-07 NOTE — Assessment & Plan Note (Signed)
She continues to do well on her salvage regimen.  No new concerns and can follow up in 6 months.

## 2021-12-27 ENCOUNTER — Other Ambulatory Visit: Payer: Self-pay | Admitting: Internal Medicine

## 2021-12-27 DIAGNOSIS — B20 Human immunodeficiency virus [HIV] disease: Secondary | ICD-10-CM

## 2022-01-02 ENCOUNTER — Telehealth: Payer: Self-pay

## 2022-01-02 NOTE — Telephone Encounter (Signed)
Patient advised of interaction between [place] and Zoloft. Patient advised that the recommendation is Gabapentin.  [name] Brooks Sailors

## 2022-01-02 NOTE — Telephone Encounter (Signed)
Patient called stating she was seen by her pcp last Friday for hot flashes. Patient stated her pcp recommend Gabapentin or Zoloft. Patient was advised by pcp to consult with our office to be sure the medications did not interact with HIV regimen. Please advise. Ghali Morissette T Brooks Sailors

## 2022-01-02 NOTE — Telephone Encounter (Signed)
There is an interaction between [place], so my recommendation would be to go with Gabapentin.

## 2022-02-01 ENCOUNTER — Institutional Professional Consult (permissible substitution): Payer: No Typology Code available for payment source | Admitting: Pulmonary Disease

## 2022-02-03 ENCOUNTER — Institutional Professional Consult (permissible substitution): Payer: No Typology Code available for payment source | Admitting: Pulmonary Disease

## 2022-02-14 ENCOUNTER — Ambulatory Visit (INDEPENDENT_AMBULATORY_CARE_PROVIDER_SITE_OTHER): Payer: No Typology Code available for payment source | Admitting: Pulmonary Disease

## 2022-02-14 ENCOUNTER — Encounter: Payer: Self-pay | Admitting: Pulmonary Disease

## 2022-02-14 VITALS — BP 122/78 | HR 85 | Temp 98.6°F | Ht 70.0 in | Wt 303.4 lb

## 2022-02-14 DIAGNOSIS — I2699 Other pulmonary embolism without acute cor pulmonale: Secondary | ICD-10-CM

## 2022-02-14 NOTE — Patient Instructions (Signed)
I will see you as needed  You are cleared to have surgery from a respiratory perspective  Past history of blood clots does not place you at any increased risk  Knee surgery of course is associated with risk of DVT, blood clots-you will be placed on blood thinners postoperatively to reduce this risk  Call with any concerns

## 2022-02-14 NOTE — Progress Notes (Signed)
[name]    [id number]    07/06/1975  Primary Care Physician:Wolters, Ivin Booty, MD  Referring Physician: Jonathon Jordan, MD [address] [place],  [place] [address]  Chief complaint:   Patient being seen for preop evaluation  HPI:  She will be having knee surgery  Past history of DVT PE treated with about 3 months of anticoagulation in 2019 DVT PE felt provoked by surgery  Has not had any shortness of breath, no chest pains or chest discomfort has been doing relatively well  She does not get short of breath with activity Never smoker No history of asthma  No significant occupational predisposition  On antiretrovirals and compliant  Outpatient Encounter Medications as of 02/14/2022  Medication Sig   ALPRAZolam (XANAX) 0.25 MG tablet Take 0.25 mg by mouth daily as needed for anxiety.    aspirin EC 81 MG tablet Take 81 mg by mouth every other day.   darunavir (PREZISTA) 800 MG tablet TAKE 1 TABLET(800 MG) BY MOUTH DAILY   diclofenac Sodium (VOLTAREN) 1 % GEL SMARTSIG:2-4 Gram(s) Topical Daily PRN   GENVOYA 150-150-200-10 MG TABS tablet TAKE 1 TABLET BY MOUTH DAILY   hydrochlorothiazide (MICROZIDE) 12.5 MG capsule Take 1 capsule (12.5 mg total) by mouth daily.   ibuprofen (ADVIL) 800 MG tablet Take 1 tablet (800 mg total) by mouth every 6 (six) hours.   meloxicam (MOBIC) 7.5 MG tablet Take by mouth.   valACYclovir (VALTREX) 1000 MG tablet Take 1 tablet by mouth every 12 (twelve) hours as needed.   Vitamin D, Ergocalciferol, (DRISDOL) 1.25 MG (50000 UNIT) CAPS capsule Take 50,000 Units by mouth once a week.   No facility-administered encounter medications on file as of 02/14/2022.    Allergies as of 02/14/2022   (No Known Allergies)    Past Medical History:  Diagnosis Date   Anxiety    Candidal esophagitis (HCC)    Complication of anesthesia 07/2011   in PACU took long time for oxygen sts to come up,2008 tonsillectomy went to ICU   Family  history of adverse reaction to anesthesia    mother H/O n/v   Fracture of right ankle, lateral malleolus    HIV (human immunodeficiency virus infection) ([place])    MAC (mycobacterium avium-intracellulare complex)    positive culture, treated for 3 months   Pulmonary embolism [facility])     Past Surgical History:  Procedure Laterality Date   ABDOMINAL HYSTERECTOMY  03/18/2019   ABDOMINAL HYSTERECTOMY N/A 03/18/2019   Procedure: HYSTERECTOMY ABDOMINAL WITH BILATERAL SALPINGECTOMY;  Surgeon: Bobbye Charleston, MD;  Location: [place];  Service: Gynecology;  Laterality: N/A;   ANKLE ARTHROSCOPY WITH OPEN REDUCTION INTERNAL FIXATION (ORIF) Right 10/31/2017   ANTERIOR CRUCIATE LIGAMENT REPAIR     Right and left repair   LYMPH NODE BIOPSY Left 11/14/2016   Procedure: Left Cervical node I/D (incision and drain) and Biopsy;  Surgeon: [name], MD;  Location: [place];  Service: Thoracic;  Laterality: Left;   NASAL RECONSTRUCTION  1982   ORIF ANKLE FRACTURE Right 11/01/2017   Procedure: OPEN REDUCTION INTERNAL FIXATION (ORIF) right lateral malleolus fracture;  Surgeon: Wylene Simmer, MD;  Location: [place];  Service: Orthopedics;  Laterality: Right;   SUPRACLAVICAL NODE BIOPSY  07/28/2011   Procedure: SUPRACLAVICAL NODE BIOPSY;  Surgeon: Nicanor Alcon, MD;  Location: [place];  Service: Thoracic;  Laterality: Left;   TONSILLECTOMY      Family History  Problem  Relation Age of Onset   Anesthesia problems Neg Hx    Hypotension Neg Hx    Malignant hyperthermia Neg Hx    Pseudochol deficiency Neg Hx     Social History   Socioeconomic History   Marital status: Single    Spouse name: Not on file   Number of children: Not on file   Years of education: Not on file   Highest education level: Not on file  Occupational History   Not on file  Tobacco Use   Smoking status: Never   Smokeless tobacco: Never  Vaping Use   Vaping Use: Never used  Substance and Sexual Activity    Alcohol use: Yes    Comment: socially   Drug use: No   Sexual activity: Not Currently    Partners: Female    Birth control/protection: None    Comment: declined condoms  Other Topics Concern   Not on file  Social History Narrative   Not on file   Social Determinants of Health   Financial Resource Strain: Not on file  Food Insecurity: Not on file  Transportation Needs: Not on file  Physical Activity: Not on file  Stress: Not on file  Social Connections: Not on file  Intimate Partner Violence: Not on file    Review of Systems  Constitutional:  Negative for fatigue.  Respiratory:  Negative for shortness of breath.     Vitals:   02/14/22 1253  BP: 122/78  Pulse: 85  Temp: 98.6 F (37 C)  SpO2: 100%     Physical Exam Constitutional:      Appearance: She is obese.  HENT:     Head: Normocephalic.     Mouth/Throat:     Mouth: Mucous membranes are moist.  Cardiovascular:     Rate and Rhythm: Normal rate and regular rhythm.     Heart sounds: No murmur heard.    No friction rub.  Pulmonary:     Effort: No respiratory distress.     Breath sounds: No stridor. No wheezing, rhonchi or rales.  Musculoskeletal:     Cervical back: No rigidity or tenderness.  Neurological:     Mental Status: She is alert.  Psychiatric:        Mood and Affect: Mood normal.      Data Reviewed: CT scan from 08/29/2018 was reviewed by myself showing no evidence of pulmonary embolism  CT scan from 11/10/2017 reviewed showing bilateral pulmonary embolism  Assessment:  Preop evaluation -Patient has no significant respiratory problems, no acutely reversible breathing issues to preclude surgery -Respiratory risk regarding the surgery is low  She does have a past history of a provoked DVT  -The surgery definitely is associated with risk of thromboembolism and patient will be on anticoagulation postoperatively to reduce this risk  Plan/Recommendations: No preclusion to surgery from a  breathing perspective  I will see you as needed  Call with significant concerns  Cleared for surgery from a respiratory perspective   Sherrilyn Rist MD [facility] 02/14/2022, 1:19 PM  CC: Jonathon Jordan, MD

## 2022-03-20 ENCOUNTER — Other Ambulatory Visit: Payer: Self-pay

## 2022-03-20 ENCOUNTER — Other Ambulatory Visit: Payer: No Typology Code available for payment source

## 2022-03-20 DIAGNOSIS — B20 Human immunodeficiency virus [HIV] disease: Secondary | ICD-10-CM

## 2022-03-21 LAB — T-HELPER CELL (CD4) - (RCID CLINIC ONLY)
CD4 % Helper T Cell: 29 % — ABNORMAL LOW (ref 33–65)
CD4 T Cell Abs: 524 /uL (ref 400–1790)

## 2022-03-22 LAB — HIV-1 RNA QUANT-NO REFLEX-BLD
HIV 1 RNA Quant: NOT DETECTED Copies/mL
HIV-1 RNA Quant, Log: NOT DETECTED Log cps/mL

## 2022-03-29 ENCOUNTER — Other Ambulatory Visit: Payer: Self-pay | Admitting: Internal Medicine

## 2022-03-29 DIAGNOSIS — B20 Human immunodeficiency virus [HIV] disease: Secondary | ICD-10-CM

## 2022-03-29 NOTE — Telephone Encounter (Signed)
Appt 12/19

## 2022-04-04 ENCOUNTER — Encounter: Payer: No Typology Code available for payment source | Admitting: Internal Medicine

## 2022-04-19 ENCOUNTER — Other Ambulatory Visit: Payer: Self-pay

## 2022-04-19 ENCOUNTER — Telehealth (INDEPENDENT_AMBULATORY_CARE_PROVIDER_SITE_OTHER): Payer: No Typology Code available for payment source | Admitting: Internal Medicine

## 2022-04-19 ENCOUNTER — Encounter: Payer: Self-pay | Admitting: Internal Medicine

## 2022-04-19 DIAGNOSIS — Z79899 Other long term (current) drug therapy: Secondary | ICD-10-CM | POA: Diagnosis not present

## 2022-04-19 DIAGNOSIS — Z113 Encounter for screening for infections with a predominantly sexual mode of transmission: Secondary | ICD-10-CM

## 2022-04-19 DIAGNOSIS — B20 Human immunodeficiency virus [HIV] disease: Secondary | ICD-10-CM | POA: Diagnosis not present

## 2022-04-19 NOTE — Assessment & Plan Note (Signed)
She continues to do well on her salvage regimen.  Labs reviewed with her and no changes indicated. She will rtc in 6 months.  With new research coming out by way of the Garner study regarding cardiovascular event risk reduction for PLWH, I discussed that over the 8 year trial initiation of statin (pitavastatin) therapy was shown to reduce CV disease by 35% independent of other risk factors.   The 10-year ASCVD risk score (Arnett DK, et al., 2019) is: 1.2%   Values used to calculate the score:     Age: 47 years     Sex: Female     Is Non-Hispanic African American: Yes     Diabetic: No     Tobacco smoker: No     Systolic Blood Pressure: 888 mmHg     Is BP treated: No     HDL Cholesterol: 47 mg/dL     Total Cholesterol: 183 mg/dL  We discussed the patient's 10-year CVD Risk Score to be at least moderately elevated, and would benefit from intervention given HIV+ and > 37 yo.   She is going to look into the medication and will discuss with her PCP if a statin therapy makes sense.  Pitivistatin was used in the study but is typically not covered so any low dose statin (10 mg atorvastatin, 10 mg rosuvastatin are alternatives).

## 2022-04-19 NOTE — Progress Notes (Signed)
   Subjective:    Patient ID: [name], female    DOB: 1976/03/09, 47 y.o.   MRN: [id number]  I connected with  [name] on 04/19/22 by a video enabled telemedicine application and verified that I am speaking with the correct person using two identifiers.   I discussed the limitations of evaluation and management by telemedicine. The patient expressed understanding and agreed to proceed.  Location: Patient - home Physician - clinic  Duration of visit:  17 minutes   HPI She is connected with for follow up of HIV She continues on Genvoya and darunavir with a history of a 184v mutation.   No missed doses or issues getting her medication.    Review of Systems  Constitutional:  Negative for fatigue.  Gastrointestinal:  Negative for diarrhea.  Skin:  Negative for rash.       Objective:   Physical Exam Eyes:     General: No scleral icterus. Pulmonary:     Effort: Pulmonary effort is normal.  Neurological:     Mental Status: She is alert.           Assessment & Plan:

## 2022-05-01 ENCOUNTER — Other Ambulatory Visit: Payer: Self-pay | Admitting: Internal Medicine

## 2022-05-01 DIAGNOSIS — B20 Human immunodeficiency virus [HIV] disease: Secondary | ICD-10-CM

## 2022-07-12 ENCOUNTER — Telehealth: Payer: Self-pay

## 2022-07-12 ENCOUNTER — Encounter: Payer: Self-pay | Admitting: Pulmonary Disease

## 2022-07-12 NOTE — Telephone Encounter (Signed)
ATC X1 LVM for patient to call the office back. Advised patient we received Surgical clearance request from Dr. Paulita Fujita. Patient needs f/u scheduled with Dr. Jenetta Downer or np before 4/20

## 2022-07-13 ENCOUNTER — Telehealth: Payer: Self-pay | Admitting: Pulmonary Disease

## 2022-07-13 NOTE — Telephone Encounter (Signed)
Fax received from Dr. Arta Silence with [facility] to perform a Colonoscopy on patient.  Patient needs surgery clearance. Surgery is 08/09/2022. Patient was seen on 02/14/2022. Office protocol is a risk assessment can be sent to surgeon if patient has been seen in 60 days or less.   Sending to Dr. Ander Slade for risk assessment or recommendations if patient needs to be seen in office prior to surgical procedure.  Dr. Jenetta Downer patient is scheduled for a OV on 08/03/2022

## 2022-08-03 ENCOUNTER — Ambulatory Visit: Payer: No Typology Code available for payment source | Admitting: Pulmonary Disease

## 2022-08-03 ENCOUNTER — Encounter: Payer: Self-pay | Admitting: Pulmonary Disease

## 2022-08-03 VITALS — BP 126/84 | HR 86 | Ht 70.0 in | Wt 283.0 lb

## 2022-08-03 DIAGNOSIS — I2699 Other pulmonary embolism without acute cor pulmonale: Secondary | ICD-10-CM

## 2022-08-03 NOTE — Patient Instructions (Signed)
From a breathing perspective  You are cleared to go ahead with your colonoscopy  No preclusion to your colonoscopy at the present time  Call us with significant concerns  I will see you just as needed

## 2022-08-03 NOTE — Progress Notes (Addendum)
[name]    [id number]    Sep 06, 1975  Primary Care Physician:Wolters, Jasmine December, MD  Referring Physician: Mila Palmer, MD [address] [place],  [place] [address]  Chief complaint:   Patient for precolonoscopy evaluation  HPI: I had seen her last prior to having knee surgery which she tolerated well  Past history of DVT PE for which she was treated for 3 months of anticoagulation in 2019 DVT/PE felt provoked by surgery  Has been doing well  Denies shortness of breath  She does admit to snoring but does not have any other symptoms to suggest she has significant sleep apnea Did have sleep apnea in the past that was treated surgically, notes that a recent study was negative  Denies history of asthma  Never smoker  Some shortness of breath with activity  No significant occupational predisposition  On antiretrovirals and compliant  Outpatient Encounter Medications as of 08/03/2022  Medication Sig   ALPRAZolam (XANAX) 0.25 MG tablet Take 0.25 mg by mouth daily as needed for anxiety.    aspirin EC 81 MG tablet Take 81 mg by mouth every other day.   darunavir (PREZISTA) 800 MG tablet TAKE 1 TABLET(800 MG) BY MOUTH DAILY   diclofenac Sodium (VOLTAREN) 1 % GEL SMARTSIG:2-4 Gram(s) Topical Daily PRN   elvitegravir-cobicistat-emtricitabine-tenofovir (GENVOYA) 150-150-200-10 MG TABS tablet TAKE 1 TABLET BY MOUTH DAILY   hydrochlorothiazide (MICROZIDE) 12.5 MG capsule Take 1 capsule (12.5 mg total) by mouth daily.   Vitamin D, Ergocalciferol, (DRISDOL) 1.25 MG (50000 UNIT) CAPS capsule Take 50,000 Units by mouth once a week.   [DISCONTINUED] ibuprofen (ADVIL) 800 MG tablet Take 1 tablet (800 mg total) by mouth every 6 (six) hours.   [DISCONTINUED] meloxicam (MOBIC) 7.5 MG tablet Take by mouth.   [DISCONTINUED] valACYclovir (VALTREX) 1000 MG tablet Take 1 tablet by mouth every 12 (twelve) hours as needed.   No facility-administered encounter medications  on file as of 08/03/2022.    Allergies as of 08/03/2022   (No Known Allergies)    Past Medical History:  Diagnosis Date   Anxiety    Candidal esophagitis    Complication of anesthesia 07/2011   in PACU took long time for oxygen sts to come up,2008 tonsillectomy went to ICU   Family history of adverse reaction to anesthesia    mother H/O n/v   Fracture of right ankle, lateral malleolus    HIV (human immunodeficiency virus infection)    MAC (mycobacterium avium-intracellulare complex)    positive culture, treated for 3 months   Pulmonary embolism     Past Surgical History:  Procedure Laterality Date   ABDOMINAL HYSTERECTOMY  03/18/2019   ABDOMINAL HYSTERECTOMY N/A 03/18/2019   Procedure: HYSTERECTOMY ABDOMINAL WITH BILATERAL SALPINGECTOMY;  Surgeon: Carrington Clamp, MD;  Location: [facility] OR;  Service: Gynecology;  Laterality: N/A;   ANKLE ARTHROSCOPY WITH OPEN REDUCTION INTERNAL FIXATION (ORIF) Right 10/31/2017   ANTERIOR CRUCIATE LIGAMENT REPAIR     Right and left repair   LYMPH NODE BIOPSY Left 11/14/2016   Procedure: Left Cervical node I/D (incision and drain) and Biopsy;  Surgeon: Delight Ovens, MD;  [facility];  Service: Thoracic;  Laterality: Left;   NASAL RECONSTRUCTION  1982   ORIF ANKLE FRACTURE Right 11/01/2017   Procedure: OPEN REDUCTION INTERNAL FIXATION (ORIF) right lateral malleolus fracture;  Surgeon: Toni Arthurs, MD;  Location: [facility];  Service: Orthopedics;  Laterality: Right;   SUPRACLAVICAL NODE BIOPSY  07/28/2011   Procedure: SUPRACLAVICAL NODE BIOPSY;  Surgeon: Ines Bloomer, MD;  Location[facility] OR;  Service: Thoracic;  Laterality: Left;   TONSILLECTOMY      Family History  Problem Relation Age of Onset   Anesthesia problems Neg Hx    Hypotension Neg Hx    Malignant hyperthermia Neg Hx    Pseudochol deficiency Neg Hx     Social History   Socioeconomic History   Marital status: Single    Spouse name: Not on file   Number of  children: Not on file   Years of education: Not on file   Highest education level: Not on file  Occupational History   Not on file  Tobacco Use   Smoking status: Never   Smokeless tobacco: Never  Vaping Use   Vaping Use: Never used  Substance and Sexual Activity   Alcohol use: Yes    Comment: socially   Drug use: No   Sexual activity: Not Currently    Partners: Female    Birth control/protection: None    Comment: declined condoms  Other Topics Concern   Not on file  Social History Narrative   Not on file   Social Determinants of Health   Financial Resource Strain: Not on file  Food Insecurity: Not on file  Transportation Needs: Not on file  Physical Activity: Not on file  Stress: Not on file  Social Connections: Not on file  Intimate Partner Violence: Not on file    Review of Systems  Constitutional:  Negative for fatigue.  Respiratory:  Negative for shortness of breath.     Vitals:   08/03/22 1607  BP: 126/84  Pulse: 86  SpO2: 97%     Physical Exam Constitutional:      Appearance: She is obese.  HENT:     Head: Normocephalic.     Mouth/Throat:     Mouth: Mucous membranes are moist.  Eyes:     General: No scleral icterus. Cardiovascular:     Rate and Rhythm: Normal rate and regular rhythm.     Heart sounds: No murmur heard.    No friction rub.  Pulmonary:     Effort: No respiratory distress.     Breath sounds: No stridor. No wheezing, rhonchi or rales.  Musculoskeletal:     Cervical back: No rigidity or tenderness.  Neurological:     Mental Status: She is alert.  Psychiatric:        Mood and Affect: Mood normal.      Data Reviewed: CT scan from 08/29/2018 was reviewed by myself showing no evidence of pulmonary embolism  CT scan from 11/10/2017 reviewed showing bilateral pulmonary embolism  Assessment:  Precolonoscopy evaluation  No respiratory complaints at present -Recently tolerated knee surgery well  Respiratory risk with  colonoscopy is low  Past history of obstructive sleep apnea that was treated surgically -States recent sleep study was negative-has been about 5 years  Plan/Recommendations: No preclusion to having colonoscopy at present  Encouraged to call with significant concerns  Cleared for colonoscopy from my perspective  Encouraged to call with significant concerns, I will see you as needed     Virl Diamond MD [facility] 08/03/2022, 4:23 PM  CC: Mila Palmer, MD

## 2022-08-04 NOTE — Telephone Encounter (Signed)
OV notes and clearance form have been faxed back to [facility] Dr. Dulce Sellar. Nothing further needed at this time.

## 2022-09-13 ENCOUNTER — Encounter (HOSPITAL_COMMUNITY): Payer: Self-pay

## 2022-09-13 ENCOUNTER — Other Ambulatory Visit: Payer: Self-pay | Admitting: Orthopaedic Surgery

## 2022-09-25 NOTE — Progress Notes (Addendum)
Anesthesia Review:  PCP:  Mila Palmer  Cardiologist : none  Pulm- DR Wynona Neat- LOV 08/03/22  INF Disease- LOV 10/07/21 - HIV  Chest x-ray : 09/29/22- 2 view  EKG : 09/29/22  Echo : 2019  Stress test: Cardiac Cath :  Activity level: can do a flight of stairs with out difficuoty  Sleep Study/ CPAP : none  Fasting Blood Sugar :      / Checks Blood Sugar -- times a day:   Blood Thinner/ Instructions /Last Dose: ASA / Instructions/ Last Dose :

## 2022-09-26 NOTE — Patient Instructions (Signed)
SURGICAL WAITING ROOM VISITATION  Patients having surgery or a procedure may have no more than 2 support people in the waiting area - these visitors may rotate.    Children under the age of 3 must have an adult with them who is not the patient.  Due to an increase in RSV and influenza rates and associated hospitalizations, children ages 76 and under may not visit patients in [facility] hospitals.  If the patient needs to stay at the hospital during part of their recovery, the visitor guidelines for inpatient rooms apply. Pre-op nurse will coordinate an appropriate time for 1 support person to accompany patient in pre-op.  This support person may not rotate.    Please refer to the [facility] website for the visitor guidelines for Inpatients (after your surgery is over and you are in a regular room).       Your procedure is scheduled on:  10/10/22    Report to [facility] Main Entrance    Report to admitting at 215 pm    Call this number if you have problems the morning of surgery [phone]   Do not eat food :After Midnight.   After Midnight you may have the following liquids until __ 145pm ____  DAY OF SURGERY  Water Non-Citrus Juices (without pulp, NO RED-Apple, White grape, White cranberry) Black Coffee (NO MILK/CREAM OR CREAMERS, sugar ok)  Clear Tea (NO MILK/CREAM OR CREAMERS, sugar ok) regular and decaf                             Plain Jell-O (NO RED)                                           Fruit ices (not with fruit pulp, NO RED)                                     Popsicles (NO RED)                                                               Sports drinks like Gatorade (NO RED)                   The day of surgery:  Drink ONE (1) Pre-Surgery Clear Ensure or G2 at   145 pm  the  day  of surgery. Drink in one sitting. Do not sip.  This drink was given to you during your hospital  pre-op appointment visit. Nothing else to drink after completing the   Pre-Surgery Clear Ensure or G2.          If you have questions, please contact your surgeon's office.       Oral Hygiene is also important to reduce your risk of infection.                                    Remember - BRUSH YOUR TEETH THE MORNING OF SURGERY WITH YOUR REGULAR  TOOTHPASTE  DENTURES WILL BE REMOVED PRIOR TO SURGERY PLEASE DO NOT APPLY "Poly grip" OR ADHESIVES!!!   Do NOT smoke after Midnight   Take these medicines the morning of surgery with A SIP OF WATER:   DO NOT TAKE ANY ORAL DIABETIC MEDICATIONS DAY OF YOUR SURGERY  Bring CPAP mask and tubing day of surgery.                              You may not have any metal on your body including hair pins, jewelry, and body piercing             Do not wear make-up, lotions, powders, perfumes/cologne, or deodorant  Do not wear nail polish including gel and S&S, artificial/acrylic nails, or any other type of covering on natural nails including finger and toenails. If you have artificial nails, gel coating, etc. that needs to be removed by a nail salon please have this removed prior to surgery or surgery may need to be canceled/ delayed if the surgeon/ anesthesia feels like they are unable to be safely monitored.   Do not shave  48 hours prior to surgery.               Men may shave face and neck.   Do not bring valuables to the hospital.  IS NOT             RESPONSIBLE   FOR VALUABLES.   Contacts, glasses, dentures or bridgework may not be worn into surgery.   Bring small overnight bag day of surgery.   DO NOT BRING YOUR HOME MEDICATIONS TO THE HOSPITAL. PHARMACY WILL DISPENSE MEDICATIONS LISTED ON YOUR MEDICATION LIST TO YOU DURING YOUR ADMISSION IN THE HOSPITAL!    Patients discharged on the day of surgery will not be allowed to drive home.  Someone NEEDS to stay with you for the first 24 hours after anesthesia.   Special Instructions: Bring a copy of your healthcare power of attorney and living will  documents the day of surgery if you haven't scanned them before.              Please read over the following fact sheets you were given: IF YOU HAVE QUESTIONS ABOUT YOUR PRE-OP INSTRUCTIONS PLEASE CALL [phone]   If you received a COVID test during your pre-op visit  it is requested that you wear a mask when out in public, stay away from anyone that may not be feeling well and notify your surgeon if you develop symptoms. If you test positive for Covid or have been in contact with anyone that has tested positive in the last 10 days please notify you surgeon.      Pre-operative 5 CHG Bath Instructions   You can play a key role in reducing the risk of infection after surgery. Your skin needs to be as free of germs as possible. You can reduce the number of germs on your skin by washing with CHG (chlorhexidine gluconate) soap before surgery. CHG is an antiseptic soap that kills germs and continues to kill germs even after washing.   DO NOT use if you have an allergy to chlorhexidine/CHG or antibacterial soaps. If your skin becomes reddened or irritated, stop using the CHG and notify one of our RNs at [phone].   Please shower with the CHG soap starting 4 days before surgery using the following schedule:     Please keep in mind  the following:  DO NOT shave, including legs and underarms, starting the day of your first shower.   You may shave your face at any point before/day of surgery.  Place clean sheets on your bed the day you start using CHG soap. Use a clean washcloth (not used since being washed) for each shower. DO NOT sleep with pets once you start using the CHG.   CHG Shower Instructions:  If you choose to wash your hair and private area, wash first with your normal shampoo/soap.  After you use shampoo/soap, rinse your hair and body thoroughly to remove shampoo/soap residue.  Turn the water OFF and apply about 3 tablespoons (45 ml) of CHG soap to a CLEAN washcloth.  Apply CHG  soap ONLY FROM YOUR NECK DOWN TO YOUR TOES (washing for 3-5 minutes)  DO NOT use CHG soap on face, private areas, open wounds, or sores.  Pay special attention to the area where your surgery is being performed.  If you are having back surgery, having someone wash your back for you may be helpful. Wait 2 minutes after CHG soap is applied, then you may rinse off the CHG soap.  Pat dry with a clean towel  Put on clean clothes/pajamas   If you choose to wear lotion, please use ONLY the CHG-compatible lotions on the back of this paper.     Additional instructions for the day of surgery: DO NOT APPLY any lotions, deodorants, cologne, or perfumes.   Put on clean/comfortable clothes.  Brush your teeth.  Ask your nurse before applying any prescription medications to the skin.      CHG Compatible Lotions   Aveeno Moisturizing lotion  Cetaphil Moisturizing Cream  Cetaphil Moisturizing Lotion  Clairol Herbal Essence Moisturizing Lotion, Dry Skin  Clairol Herbal Essence Moisturizing Lotion, Extra Dry Skin  Clairol Herbal Essence Moisturizing Lotion, Normal Skin  Curel Age Defying Therapeutic Moisturizing Lotion with Alpha Hydroxy  Curel Extreme Care Body Lotion  Curel Soothing Hands Moisturizing Hand Lotion  Curel Therapeutic Moisturizing Cream, Fragrance-Free  Curel Therapeutic Moisturizing Lotion, Fragrance-Free  Curel Therapeutic Moisturizing Lotion, Original Formula  Eucerin Daily Replenishing Lotion  Eucerin Dry Skin Therapy Plus Alpha Hydroxy Crme  Eucerin Dry Skin Therapy Plus Alpha Hydroxy Lotion  Eucerin Original Crme  Eucerin Original Lotion  Eucerin Plus Crme Eucerin Plus Lotion  Eucerin TriLipid Replenishing Lotion  Keri Anti-Bacterial Hand Lotion  Keri Deep Conditioning Original Lotion Dry Skin Formula Softly Scented  Keri Deep Conditioning Original Lotion, Fragrance Free Sensitive Skin Formula  Keri Lotion Fast Absorbing Fragrance Free Sensitive Skin Formula  Keri  Lotion Fast Absorbing Softly Scented Dry Skin Formula  Keri Original Lotion  Keri Skin Renewal Lotion Keri Silky Smooth Lotion  Keri Silky Smooth Sensitive Skin Lotion  Nivea Body Creamy Conditioning Oil  Nivea Body Extra Enriched Teacher, adult education Moisturizing Lotion Nivea Crme  Nivea Skin Firming Lotion  NutraDerm 30 Skin Lotion  NutraDerm Skin Lotion  NutraDerm Therapeutic Skin Cream  NutraDerm Therapeutic Skin Lotion  ProShield Protective Hand Cream  Provon moisturizing lotion

## 2022-09-27 NOTE — Progress Notes (Signed)
The 10-year ASCVD risk score (Arnett DK, et al., 2019) is: 1.4%   Values used to calculate the score:     Age: 47 years     Sex: Female     Is Non-Hispanic African American: Yes     Diabetic: No     Tobacco smoker: No     Systolic Blood Pressure: 126 mmHg     Is BP treated: No     HDL Cholesterol: 47 mg/dL     Total Cholesterol: 183 mg/dL  Sandie Ano, RN

## 2022-09-29 ENCOUNTER — Ambulatory Visit (HOSPITAL_COMMUNITY)
Admission: RE | Admit: 2022-09-29 | Discharge: 2022-09-29 | Disposition: A | Discharge status: Home / Self Care | Payer: No Typology Code available for payment source | Source: Ambulatory Visit | Attending: Orthopaedic Surgery | Admitting: Orthopaedic Surgery

## 2022-09-29 ENCOUNTER — Encounter (HOSPITAL_COMMUNITY): Payer: Self-pay

## 2022-09-29 ENCOUNTER — Encounter (HOSPITAL_COMMUNITY)
Admission: RE | Admit: 2022-09-29 | Discharge: 2022-09-29 | Disposition: A | Discharge status: Home / Self Care | Payer: No Typology Code available for payment source | Source: Ambulatory Visit | Attending: Orthopaedic Surgery | Admitting: Orthopaedic Surgery

## 2022-09-29 ENCOUNTER — Other Ambulatory Visit: Payer: Self-pay

## 2022-09-29 VITALS — BP 135/93 | HR 73 | Temp 98.4°F | Resp 16 | Ht 70.0 in | Wt 267.0 lb

## 2022-09-29 DIAGNOSIS — Z01818 Encounter for other preprocedural examination: Secondary | ICD-10-CM | POA: Diagnosis present

## 2022-09-29 HISTORY — DX: Essential (primary) hypertension: I10

## 2022-09-29 HISTORY — DX: Unspecified osteoarthritis, unspecified site: M19.90

## 2022-09-29 HISTORY — DX: Acute embolism and thrombosis of unspecified deep veins of right lower extremity: I82.401

## 2022-09-29 LAB — CBC
HCT: 43.2 % (ref 36.0–46.0)
Hemoglobin: 14.1 g/dL (ref 12.0–15.0)
MCH: 29.4 pg (ref 26.0–34.0)
MCHC: 32.6 g/dL (ref 30.0–36.0)
MCV: 90 fL (ref 80.0–100.0)
Platelets: 311 10*3/uL (ref 150–400)
RBC: 4.8 MIL/uL (ref 3.87–5.11)
RDW: 13.8 % (ref 11.5–15.5)
WBC: 7.4 10*3/uL (ref 4.0–10.5)
nRBC: 0 % (ref 0.0–0.2)

## 2022-09-29 LAB — BASIC METABOLIC PANEL
Anion gap: 7 (ref 5–15)
BUN: 15 mg/dL (ref 6–20)
CO2: 27 mmol/L (ref 22–32)
Calcium: 9 mg/dL (ref 8.9–10.3)
Chloride: 104 mmol/L (ref 98–111)
Creatinine, Ser: 0.88 mg/dL (ref 0.44–1.00)
GFR, Estimated: >60 mL/min (ref >60)
Glucose, Bld: 93 mg/dL (ref 70–99)
Potassium: 4.1 mmol/L (ref 3.5–5.1)
Sodium: 138 mmol/L (ref 135–145)

## 2022-09-29 LAB — SURGICAL PCR SCREEN
MRSA, PCR: NEGATIVE
Staphylococcus aureus: NEGATIVE

## 2022-10-09 NOTE — H&P (Signed)
TOTAL KNEE ADMISSION H&P  Patient is being admitted for right total knee arthroplasty.  Subjective:  Chief Complaint:right knee pain.  HPI: [name], 47 y.o. female, has a history of pain and functional disability in the right knee due to arthritis and has failed non-surgical conservative treatments for greater than 12 weeks to includeNSAID's and/or analgesics, corticosteriod injections, viscosupplementation injections, flexibility and strengthening excercises, use of assistive devices, weight reduction as appropriate, and activity modification.  Onset of symptoms was gradual, starting 5 years ago with gradually worsening course since that time. The patient noted no past surgery on the right knee(s).  Patient currently rates pain in the right knee(s) at 10 out of 10 with activity. Patient has night pain, worsening of pain with activity and weight bearing, pain that interferes with activities of daily living, crepitus, and joint swelling.  Patient has evidence of subchondral cysts, subchondral sclerosis, periarticular osteophytes, and joint space narrowing by imaging studies. There is no active infection.  Patient Active Problem List   Diagnosis Date Noted   Postoperative state 03/18/2019   Medication monitoring encounter 03/25/2018   Right leg DVT (HCC) 11/11/2017   Bilateral pulmonary embolism (HCC) 11/10/2017   Granulomatous adenopathy 12/21/2016   Fistula, skin 10/03/2016   Screening examination for venereal disease 08/06/2013   Encounter for long-term (current) use of high-risk medication 08/06/2013   HIV disease (HCC) 12/29/2010   Anxiety 12/29/2010   Past Medical History:  Diagnosis Date   Anxiety    Arthritis    Candidal esophagitis (HCC)    Complication of anesthesia 07/2011   in PACU took long time for oxygen sts to come up,2008 tonsillectomy went to ICU   Deep vein blood clot of right lower extremity (HCC)    Family history of adverse reaction to anesthesia    mother  H/O n/v   Fracture of right ankle, lateral malleolus    HIV (human immunodeficiency virus infection) (HCC)    Hypertension    MAC (mycobacterium avium-intracellulare complex)    positive culture, treated for 3 months   Pulmonary embolism (HCC)     Past Surgical History:  Procedure Laterality Date   ABDOMINAL HYSTERECTOMY  03/18/2019   ABDOMINAL HYSTERECTOMY N/A 03/18/2019   Procedure: HYSTERECTOMY ABDOMINAL WITH BILATERAL SALPINGECTOMY;  Surgeon: Carrington Clamp, MD;  Location: [facility] OR;  Service: Gynecology;  Laterality: N/A;   ANKLE ARTHROSCOPY WITH OPEN REDUCTION INTERNAL FIXATION (ORIF) Right 10/31/2017   ANTERIOR CRUCIATE LIGAMENT REPAIR     Right and left repair   bilateral rotator cuff repari      LYMPH NODE BIOPSY Left 11/14/2016   Procedure: Left Cervical node I/D (incision and drain) and Biopsy;  Surgeon: Delight Ovens, MD;  Location: [facility] OR;  Service: Thoracic;  Laterality: Left;   NASAL RECONSTRUCTION  1982   ORIF ANKLE FRACTURE Right 11/01/2017   Procedure: OPEN REDUCTION INTERNAL FIXATION (ORIF) right lateral malleolus fracture;  Surgeon: Toni Arthurs, MD;  Location: [facility];  Service: Orthopedics;  Laterality: Right;   SUPRACLAVICAL NODE BIOPSY  07/28/2011   Procedure: SUPRACLAVICAL NODE BIOPSY;  Surgeon: Ines Bloomer, MD;  Location: [facility] OR;  Service: Thoracic;  Laterality: Left;   TONSILLECTOMY      No current facility-administered medications for this encounter.   Current Outpatient Medications  Medication Sig Dispense Refill Last Dose   ALPRAZolam (XANAX) 0.25 MG tablet Take 0.25 mg by mouth daily as needed for anxiety.   0    cetirizine (ZYRTEC) 10 MG tablet Take  10 mg by mouth at bedtime as needed for allergies.      clotrimazole-betamethasone (LOTRISONE) cream Apply 1 Application topically 2 (two) times daily as needed (rash).      darunavir (PREZISTA) 800 MG tablet TAKE 1 TABLET(800 MG) BY MOUTH DAILY 30 tablet 5     elvitegravir-cobicistat-emtricitabine-tenofovir (GENVOYA) 150-150-200-10 MG TABS tablet TAKE 1 TABLET BY MOUTH DAILY 30 tablet 5    hydrochlorothiazide (MICROZIDE) 12.5 MG capsule Take 1 capsule (12.5 mg total) by mouth daily. 30 capsule 0    Phenylephrine-Acetaminophen (TYLENOL SINUS+HEADACHE PO) Take 2 tablets by mouth daily as needed (congestion).      No Known Allergies  Social History   Tobacco Use   Smoking status: Never   Smokeless tobacco: Never  Substance Use Topics   Alcohol use: Yes    Comment: socially    Family History  Problem Relation Age of Onset   Anesthesia problems Neg Hx    Hypotension Neg Hx    Malignant hyperthermia Neg Hx    Pseudochol deficiency Neg Hx      Review of Systems  Musculoskeletal:  Positive for arthralgias.       Right knee    Objective:  Physical Exam Constitutional:      Appearance: Normal appearance.  HENT:     Head: Normocephalic and atraumatic.     Nose: Nose normal.     Mouth/Throat:     Pharynx: Oropharynx is clear.  Eyes:     Extraocular Movements: Extraocular movements intact.  Pulmonary:     Effort: Pulmonary effort is normal.  Abdominal:     Palpations: Abdomen is soft.  Musculoskeletal:     Cervical back: Normal range of motion.     Comments: Examination of the right knee shows range of motion from about 0-90 of flexion.  She has tenderness to palpation on the joint line.  1+ crepitation.  No effusion.  Ligaments are stable.  Calf is soft and nontender.  She is neurovascularly intact distally.    Skin:    General: Skin is warm and dry.  Neurological:     General: No focal deficit present.     Mental Status: She is alert and oriented to person, place, and time. Mental status is at baseline.  Psychiatric:        Mood and Affect: Mood normal.        Behavior: Behavior normal.        Thought Content: Thought content normal.        Judgment: Judgment normal.     Vital signs in last 24 hours:     Labs:   Estimated body mass index is 38.31 kg/m as calculated from the following:   Height as of 09/29/22: 5\' 10"  (1.778 m).   Weight as of 09/29/22: 121.1 kg.   Imaging Review Plain radiographs demonstrate severe degenerative joint disease of the right knee(s). The overall alignment isneutral. The bone quality appears to be good for age and reported activity level.      Assessment/Plan:  End stage primary arthritis, right knee   The patient history, physical examination, clinical judgment of the provider and imaging studies are consistent with end stage degenerative joint disease of the right knee(s) and total knee arthroplasty is deemed medically necessary. The treatment options including medical management, injection therapy arthroscopy and arthroplasty were discussed at length. The risks and benefits of total knee arthroplasty were presented and reviewed. The risks due to aseptic loosening, infection, stiffness, patella tracking problems, thromboembolic  complications and other imponderables were discussed. The patient acknowledged the explanation, agreed to proceed with the plan and consent was signed. Patient is being admitted for inpatient treatment for surgery, pain control, PT, OT, prophylactic antibiotics, VTE prophylaxis, progressive ambulation and ADL's and discharge planning. The patient is planning to be discharged home with home health services

## 2022-10-10 ENCOUNTER — Observation Stay (HOSPITAL_COMMUNITY)
Admission: RE | Admit: 2022-10-10 | Discharge: 2022-10-11 | Disposition: A | Discharge status: Home / Self Care | Payer: No Typology Code available for payment source | Attending: Orthopaedic Surgery | Admitting: Orthopaedic Surgery

## 2022-10-10 ENCOUNTER — Other Ambulatory Visit: Payer: Self-pay

## 2022-10-10 ENCOUNTER — Ambulatory Visit (HOSPITAL_COMMUNITY): Payer: No Typology Code available for payment source | Admitting: Physician Assistant

## 2022-10-10 ENCOUNTER — Encounter (HOSPITAL_COMMUNITY)
Admission: RE | Disposition: A | Discharge status: Home / Self Care | Payer: Self-pay | Source: Home / Self Care | Attending: Orthopaedic Surgery

## 2022-10-10 ENCOUNTER — Encounter (HOSPITAL_COMMUNITY): Payer: Self-pay | Admitting: Orthopaedic Surgery

## 2022-10-10 ENCOUNTER — Ambulatory Visit (HOSPITAL_COMMUNITY): Payer: No Typology Code available for payment source | Admitting: Anesthesiology

## 2022-10-10 DIAGNOSIS — M1711 Unilateral primary osteoarthritis, right knee: Secondary | ICD-10-CM

## 2022-10-10 DIAGNOSIS — I82409 Acute embolism and thrombosis of unspecified deep veins of unspecified lower extremity: Secondary | ICD-10-CM | POA: Diagnosis not present

## 2022-10-10 DIAGNOSIS — Z79899 Other long term (current) drug therapy: Secondary | ICD-10-CM | POA: Insufficient documentation

## 2022-10-10 DIAGNOSIS — Z86718 Personal history of other venous thrombosis and embolism: Secondary | ICD-10-CM | POA: Diagnosis not present

## 2022-10-10 DIAGNOSIS — Z86711 Personal history of pulmonary embolism: Secondary | ICD-10-CM | POA: Diagnosis not present

## 2022-10-10 DIAGNOSIS — I10 Essential (primary) hypertension: Secondary | ICD-10-CM

## 2022-10-10 DIAGNOSIS — Z01818 Encounter for other preprocedural examination: Secondary | ICD-10-CM

## 2022-10-10 DIAGNOSIS — B20 Human immunodeficiency virus [HIV] disease: Secondary | ICD-10-CM | POA: Diagnosis not present

## 2022-10-10 DIAGNOSIS — F419 Anxiety disorder, unspecified: Secondary | ICD-10-CM | POA: Diagnosis not present

## 2022-10-10 HISTORY — PX: TOTAL KNEE ARTHROPLASTY: SHX125

## 2022-10-10 SURGERY — ARTHROPLASTY, KNEE, TOTAL
Anesthesia: Monitor Anesthesia Care | Site: Knee | Laterality: Right

## 2022-10-10 MED ORDER — PROPOFOL 1000 MG/100ML IV EMUL
INTRAVENOUS | Status: AC
  Filled 2022-10-10: qty 100

## 2022-10-10 MED ORDER — PHENOL 1.4 % MT LIQD: 1.0000 | OROMUCOSAL | Status: DC | PRN

## 2022-10-10 MED ORDER — MIDAZOLAM HCL 2 MG/2ML IJ SOLN
1.0000 mg | INTRAMUSCULAR | Status: AC
  Administered 2022-10-10: 2 mg via INTRAVENOUS
  Filled 2022-10-10: qty 2

## 2022-10-10 MED ORDER — LORATADINE 10 MG PO TABS
10.0000 mg | ORAL_TABLET | Freq: Every day | ORAL | Status: DC
  Filled 2022-10-10: qty 1

## 2022-10-10 MED ORDER — BUPIVACAINE-EPINEPHRINE 0.5% -1:200000 IJ SOLN
INTRAMUSCULAR | Status: DC | PRN
  Administered 2022-10-10: 30 mL

## 2022-10-10 MED ORDER — EPINEPHRINE PF 1 MG/ML IJ SOLN
INTRAMUSCULAR | Status: AC
  Filled 2022-10-10: qty 1

## 2022-10-10 MED ORDER — ALUM & MAG HYDROXIDE-SIMETH 200-200-20 MG/5ML PO SUSP: 30.0000 mL | ORAL | Status: DC | PRN

## 2022-10-10 MED ORDER — KETOROLAC TROMETHAMINE 15 MG/ML IJ SOLN
15.0000 mg | Freq: Four times a day (QID) | INTRAMUSCULAR | Status: AC
  Administered 2022-10-10 – 2022-10-11 (×4): 15 mg via INTRAVENOUS
  Filled 2022-10-10 (×4): qty 1

## 2022-10-10 MED ORDER — LACTATED RINGERS IV SOLN: INTRAVENOUS | Status: DC

## 2022-10-10 MED ORDER — MENTHOL 3 MG MT LOZG: 1.0000 | LOZENGE | OROMUCOSAL | Status: DC | PRN

## 2022-10-10 MED ORDER — FENTANYL CITRATE PF 50 MCG/ML IJ SOSY: 25.0000 ug | PREFILLED_SYRINGE | INTRAMUSCULAR | Status: DC | PRN

## 2022-10-10 MED ORDER — DARUNAVIR 800 MG PO TABS
800.0000 mg | ORAL_TABLET | Freq: Every day | ORAL | Status: DC
  Administered 2022-10-11: 800 mg via ORAL
  Filled 2022-10-10: qty 1

## 2022-10-10 MED ORDER — TRANEXAMIC ACID-NACL 1000-0.7 MG/100ML-% IV SOLN
1000.0000 mg | Freq: Once | INTRAVENOUS | Status: AC
  Administered 2022-10-10: 1000 mg via INTRAVENOUS
  Filled 2022-10-10: qty 100

## 2022-10-10 MED ORDER — DEXAMETHASONE SODIUM PHOSPHATE 4 MG/ML IJ SOLN
INTRAMUSCULAR | Status: DC | PRN
  Administered 2022-10-10: 8 mg via INTRAVENOUS

## 2022-10-10 MED ORDER — ACETAMINOPHEN 500 MG PO TABS
500.0000 mg | ORAL_TABLET | Freq: Four times a day (QID) | ORAL | Status: DC
  Administered 2022-10-10 – 2022-10-11 (×2): 500 mg via ORAL
  Filled 2022-10-10 (×3): qty 1

## 2022-10-10 MED ORDER — MEPERIDINE HCL 50 MG/ML IJ SOLN
INTRAMUSCULAR | Status: AC
  Administered 2022-10-10: 25 mg via INTRAVENOUS
  Filled 2022-10-10: qty 1

## 2022-10-10 MED ORDER — ALPRAZOLAM 0.25 MG PO TABS: 0.2500 mg | ORAL_TABLET | Freq: Every day | ORAL | Status: DC | PRN

## 2022-10-10 MED ORDER — METHOCARBAMOL 500 MG PO TABS
500.0000 mg | ORAL_TABLET | Freq: Four times a day (QID) | ORAL | Status: DC | PRN
  Administered 2022-10-10 – 2022-10-11 (×4): 500 mg via ORAL
  Filled 2022-10-10 (×4): qty 1

## 2022-10-10 MED ORDER — BUPIVACAINE LIPOSOME 1.3 % IJ SUSP: 20.0000 mL | Freq: Once | INTRAMUSCULAR | Status: DC

## 2022-10-10 MED ORDER — BUPIVACAINE LIPOSOME 1.3 % IJ SUSP
INTRAMUSCULAR | Status: DC | PRN
  Administered 2022-10-10: 20 mL

## 2022-10-10 MED ORDER — ELVITEG-COBIC-EMTRICIT-TENOFAF 150-150-200-10 MG PO TABS
1.0000 | ORAL_TABLET | Freq: Every day | ORAL | Status: DC
  Administered 2022-10-10 – 2022-10-11 (×2): 1 via ORAL
  Filled 2022-10-10 (×2): qty 1

## 2022-10-10 MED ORDER — PROPOFOL 500 MG/50ML IV EMUL
INTRAVENOUS | Status: AC
  Filled 2022-10-10: qty 50

## 2022-10-10 MED ORDER — METOCLOPRAMIDE HCL 5 MG PO TABS: 5.0000 mg | ORAL_TABLET | Freq: Three times a day (TID) | ORAL | Status: DC | PRN

## 2022-10-10 MED ORDER — ROPIVACAINE HCL 7.5 MG/ML IJ SOLN
INTRAMUSCULAR | Status: DC | PRN
  Administered 2022-10-10: 20 mL via PERINEURAL

## 2022-10-10 MED ORDER — ENOXAPARIN SODIUM 40 MG/0.4ML IJ SOSY: 40.0000 mg | PREFILLED_SYRINGE | INTRAMUSCULAR | Status: DC

## 2022-10-10 MED ORDER — APIXABAN 2.5 MG PO TABS: 2.5000 mg | ORAL_TABLET | Freq: Two times a day (BID) | ORAL | Status: DC

## 2022-10-10 MED ORDER — FENTANYL CITRATE PF 50 MCG/ML IJ SOSY
PREFILLED_SYRINGE | INTRAMUSCULAR | Status: AC
  Administered 2022-10-10: 50 ug via INTRAVENOUS
  Filled 2022-10-10: qty 1

## 2022-10-10 MED ORDER — DEXMEDETOMIDINE HCL IN NACL 80 MCG/20ML IV SOLN
INTRAVENOUS | Status: DC | PRN
  Administered 2022-10-10: 20 ug via INTRAVENOUS

## 2022-10-10 MED ORDER — SODIUM CHLORIDE (PF) 0.9 % IJ SOLN
INTRAMUSCULAR | Status: AC
  Filled 2022-10-10: qty 50

## 2022-10-10 MED ORDER — PHENYLEPHRINE HCL (PRESSORS) 10 MG/ML IV SOLN
INTRAVENOUS | Status: DC | PRN
  Administered 2022-10-10 (×3): 160 ug via INTRAVENOUS

## 2022-10-10 MED ORDER — BISACODYL 5 MG PO TBEC: 5.0000 mg | DELAYED_RELEASE_TABLET | Freq: Every day | ORAL | Status: DC | PRN

## 2022-10-10 MED ORDER — DIPHENHYDRAMINE HCL 12.5 MG/5ML PO ELIX: 12.5000 mg | ORAL_SOLUTION | ORAL | Status: DC | PRN

## 2022-10-10 MED ORDER — MEPERIDINE HCL 50 MG/ML IJ SOLN: 25.0000 mg | Freq: Once | INTRAMUSCULAR | Status: AC

## 2022-10-10 MED ORDER — HYDROCODONE-ACETAMINOPHEN 5-325 MG PO TABS
1.0000 | ORAL_TABLET | ORAL | Status: DC | PRN
  Administered 2022-10-11: 2 via ORAL
  Filled 2022-10-10: qty 2

## 2022-10-10 MED ORDER — ACETAMINOPHEN 10 MG/ML IV SOLN
INTRAVENOUS | Status: AC
  Filled 2022-10-10: qty 100

## 2022-10-10 MED ORDER — STERILE WATER FOR IRRIGATION IR SOLN
Status: DC | PRN
  Administered 2022-10-10: 2000 mL

## 2022-10-10 MED ORDER — HYDROCODONE-ACETAMINOPHEN 7.5-325 MG PO TABS
1.0000 | ORAL_TABLET | ORAL | Status: DC | PRN
  Administered 2022-10-10 (×2): 1 via ORAL
  Administered 2022-10-10: 2 via ORAL
  Filled 2022-10-10: qty 2
  Filled 2022-10-10 (×2): qty 1
  Filled 2022-10-10: qty 2

## 2022-10-10 MED ORDER — TRANEXAMIC ACID 1000 MG/10ML IV SOLN
INTRAVENOUS | Status: DC | PRN
  Administered 2022-10-10: 2000 mg via TOPICAL

## 2022-10-10 MED ORDER — DOCUSATE SODIUM 100 MG PO CAPS
100.0000 mg | ORAL_CAPSULE | Freq: Two times a day (BID) | ORAL | Status: DC
  Administered 2022-10-10 – 2022-10-11 (×2): 100 mg via ORAL
  Filled 2022-10-10 (×2): qty 1

## 2022-10-10 MED ORDER — CEFAZOLIN SODIUM-DEXTROSE 2-4 GM/100ML-% IV SOLN
2.0000 g | Freq: Four times a day (QID) | INTRAVENOUS | Status: AC
  Administered 2022-10-10 (×2): 2 g via INTRAVENOUS
  Filled 2022-10-10 (×2): qty 100

## 2022-10-10 MED ORDER — PROPOFOL 500 MG/50ML IV EMUL
INTRAVENOUS | Status: DC | PRN
  Administered 2022-10-10: 100 ug/kg/min via INTRAVENOUS

## 2022-10-10 MED ORDER — ONDANSETRON HCL 4 MG/2ML IJ SOLN
INTRAMUSCULAR | Status: DC | PRN
  Administered 2022-10-10: 4 mg via INTRAVENOUS

## 2022-10-10 MED ORDER — BUPIVACAINE HCL (PF) 0.5 % IJ SOLN
INTRAMUSCULAR | Status: AC
  Filled 2022-10-10: qty 30

## 2022-10-10 MED ORDER — ORAL CARE MOUTH RINSE: 15.0000 mL | Freq: Once | OROMUCOSAL | Status: AC

## 2022-10-10 MED ORDER — ONDANSETRON HCL 4 MG/2ML IJ SOLN: 4.0000 mg | Freq: Four times a day (QID) | INTRAMUSCULAR | Status: DC | PRN

## 2022-10-10 MED ORDER — ACETAMINOPHEN 10 MG/ML IV SOLN
1000.0000 mg | Freq: Once | INTRAVENOUS | Status: DC | PRN
  Administered 2022-10-10: 1000 mg via INTRAVENOUS

## 2022-10-10 MED ORDER — METOCLOPRAMIDE HCL 5 MG/ML IJ SOLN: 5.0000 mg | Freq: Three times a day (TID) | INTRAMUSCULAR | Status: DC | PRN

## 2022-10-10 MED ORDER — METHOCARBAMOL 1000 MG/10ML IJ SOLN: 500.0000 mg | Freq: Four times a day (QID) | INTRAVENOUS | Status: DC | PRN

## 2022-10-10 MED ORDER — BUPIVACAINE IN DEXTROSE 0.75-8.25 % IT SOLN
INTRATHECAL | Status: DC | PRN
  Administered 2022-10-10: 1.6 mL via INTRATHECAL

## 2022-10-10 MED ORDER — TRANEXAMIC ACID 1000 MG/10ML IV SOLN
2000.0000 mg | INTRAVENOUS | Status: DC
  Filled 2022-10-10: qty 20

## 2022-10-10 MED ORDER — TRANEXAMIC ACID-NACL 1000-0.7 MG/100ML-% IV SOLN
1000.0000 mg | INTRAVENOUS | Status: AC
  Administered 2022-10-10: 1000 mg via INTRAVENOUS
  Filled 2022-10-10: qty 100

## 2022-10-10 MED ORDER — ACETAMINOPHEN 325 MG PO TABS: 325.0000 mg | ORAL_TABLET | Freq: Four times a day (QID) | ORAL | Status: DC | PRN

## 2022-10-10 MED ORDER — BUPIVACAINE LIPOSOME 1.3 % IJ SUSP
INTRAMUSCULAR | Status: AC
  Filled 2022-10-10: qty 20

## 2022-10-10 MED ORDER — 0.9 % SODIUM CHLORIDE (POUR BTL) OPTIME
TOPICAL | Status: DC | PRN
  Administered 2022-10-10: 1000 mL

## 2022-10-10 MED ORDER — MORPHINE SULFATE (PF) 2 MG/ML IV SOLN
0.5000 mg | INTRAVENOUS | Status: DC | PRN
  Administered 2022-10-10: 0.5 mg via INTRAVENOUS
  Filled 2022-10-10: qty 1

## 2022-10-10 MED ORDER — FENTANYL CITRATE PF 50 MCG/ML IJ SOSY
50.0000 ug | PREFILLED_SYRINGE | INTRAMUSCULAR | Status: AC
  Administered 2022-10-10: 100 ug via INTRAVENOUS
  Filled 2022-10-10: qty 2

## 2022-10-10 MED ORDER — ONDANSETRON HCL 4 MG PO TABS: 4.0000 mg | ORAL_TABLET | Freq: Four times a day (QID) | ORAL | Status: DC | PRN

## 2022-10-10 MED ORDER — SODIUM CHLORIDE (PF) 0.9 % IJ SOLN
INTRAMUSCULAR | Status: DC | PRN
  Administered 2022-10-10: 30 mL

## 2022-10-10 MED ORDER — HYDROCHLOROTHIAZIDE 12.5 MG PO TABS
12.5000 mg | ORAL_TABLET | Freq: Every day | ORAL | Status: DC
  Administered 2022-10-10 – 2022-10-11 (×2): 12.5 mg via ORAL
  Filled 2022-10-10 (×2): qty 1

## 2022-10-10 MED ORDER — CEFAZOLIN IN SODIUM CHLORIDE 3-0.9 GM/100ML-% IV SOLN
3.0000 g | INTRAVENOUS | Status: AC
  Administered 2022-10-10: 3 g via INTRAVENOUS
  Filled 2022-10-10: qty 100

## 2022-10-10 MED ORDER — POVIDONE-IODINE 10 % EX SWAB: 2.0000 | Freq: Once | CUTANEOUS | Status: DC

## 2022-10-10 MED ORDER — CHLORHEXIDINE GLUCONATE 0.12 % MT SOLN
15.0000 mL | Freq: Once | OROMUCOSAL | Status: AC
  Administered 2022-10-10: 15 mL via OROMUCOSAL

## 2022-10-10 SURGICAL SUPPLY — 60 items
ATTUNE PS FEM RT SZ 7 CEM KNEE (Femur) IMPLANT
ATTUNE PSRP INSR SZ7 6 KNEE (Insert) IMPLANT
BAG COUNTER SPONGE SURGICOUNT (BAG) ×1 IMPLANT
BAG DECANTER FOR FLEXI CONT (MISCELLANEOUS) ×2 IMPLANT
BAG SPEC THK2 15X12 ZIP CLS (MISCELLANEOUS) ×1
BAG SPNG CNTER NS LX DISP (BAG) ×1
BAG ZIPLOCK 12X15 (MISCELLANEOUS) ×2 IMPLANT
BASE TIBIAL ATTUNE SZ7 RP REV (Joint) IMPLANT
BLADE SAGITTAL 25.0X1.19X90 (BLADE) ×1 IMPLANT
BLADE SAW SGTL 11.0X1.19X90.0M (BLADE) ×2 IMPLANT
BLADE SURG SZ10 CARB STEEL (BLADE) ×2 IMPLANT
BNDG CMPR 5X62 HK CLSR LF (GAUZE/BANDAGES/DRESSINGS) ×1
BNDG ELASTIC 6INX 5YD STR LF (GAUZE/BANDAGES/DRESSINGS) ×1 IMPLANT
BOOTIES KNEE HIGH SLOAN (MISCELLANEOUS) ×1 IMPLANT
BOWL SMART MIX CTS (DISPOSABLE) ×2 IMPLANT
BSPLAT TIB 7 CMNT REV ROT PLAT (Joint) ×1 IMPLANT
CEMENT HV SMART SET (Cement) ×2 IMPLANT
COVER SURGICAL LIGHT HANDLE (MISCELLANEOUS) ×1 IMPLANT
CUFF TOURN SGL QUICK 34 (TOURNIQUET CUFF) ×1
CUFF TRNQT CYL 34X4.125X (TOURNIQUET CUFF) ×2 IMPLANT
DRAPE TOP 10253 STERILE (DRAPES) ×2 IMPLANT
DRAPE U-SHAPE 47X51 STRL (DRAPES) ×2 IMPLANT
DRESSING AQUACEL AG SP 3.5X10 (GAUZE/BANDAGES/DRESSINGS) IMPLANT
DRSG AQUACEL AG ADV 3.5X10 (GAUZE/BANDAGES/DRESSINGS) ×2 IMPLANT
DRSG AQUACEL AG SP 3.5X10 (GAUZE/BANDAGES/DRESSINGS) ×1
DURAPREP 26ML APPLICATOR (WOUND CARE) ×4 IMPLANT
ELECT REM PT RETURN 15FT ADLT (MISCELLANEOUS) ×2 IMPLANT
GLOVE BIO SURGEON STRL SZ8 (GLOVE) ×4 IMPLANT
GLOVE BIOGEL PI IND STRL 7.0 (GLOVE) ×2 IMPLANT
GLOVE BIOGEL PI IND STRL 8 (GLOVE) ×2 IMPLANT
GLOVE SURG SYN 7.0 (GLOVE) ×1 IMPLANT
GLOVE SURG SYN 7.0 PF PI (GLOVE) ×2 IMPLANT
GOWN SRG XL LVL 4 BRTHBL STRL (GOWNS) ×1 IMPLANT
GOWN STRL NON-REIN XL LVL4 (GOWNS) ×1
GOWN STRL REUS W/ TWL XL LVL3 (GOWN DISPOSABLE) ×4 IMPLANT
GOWN STRL REUS W/TWL XL LVL3 (GOWN DISPOSABLE) ×2
HANDPIECE INTERPULSE COAX TIP (DISPOSABLE) ×1
HOLDER FOLEY CATH W/STRAP (MISCELLANEOUS) IMPLANT
HOOD PEEL AWAY T7 (MISCELLANEOUS) ×3 IMPLANT
KIT TURNOVER KIT A (KITS) IMPLANT
MANIFOLD NEPTUNE II (INSTRUMENTS) ×2 IMPLANT
NS IRRIG 1000ML POUR BTL (IV SOLUTION) ×2 IMPLANT
PACK TOTAL KNEE CUSTOM (KITS) ×2 IMPLANT
PAD ARMBOARD 7.5X6 YLW CONV (MISCELLANEOUS) ×1 IMPLANT
PATELLA MEDIAL ATTUN 35MM KNEE (Knees) IMPLANT
PIN STEINMAN FIXATION KNEE (PIN) IMPLANT
PROTECTOR NERVE ULNAR (MISCELLANEOUS) ×1 IMPLANT
SET HNDPC FAN SPRY TIP SCT (DISPOSABLE) ×1 IMPLANT
SPIKE FLUID TRANSFER (MISCELLANEOUS) ×4 IMPLANT
SUT ETHIBOND NAB CT1 #1 30IN (SUTURE) ×2 IMPLANT
SUT VIC AB 0 CT1 36 (SUTURE) ×1 IMPLANT
SUT VIC AB 2-0 CT1 27 (SUTURE) ×1
SUT VIC AB 2-0 CT1 TAPERPNT 27 (SUTURE) ×1 IMPLANT
SUT VICRYL AB 3-0 FS1 BRD 27IN (SUTURE) ×2 IMPLANT
SUT VLOC 180 0 24IN GS25 (SUTURE) ×1 IMPLANT
TIBIAL BASE ATTUNE SZ7 RP REV (Joint) ×1 IMPLANT
TRAY FOLEY MTR SLVR 16FR STAT (SET/KITS/TRAYS/PACK) IMPLANT
WATER STERILE IRR 1000ML POUR (IV SOLUTION) ×4 IMPLANT
WRAP KNEE MAXI GEL POST OP (GAUZE/BANDAGES/DRESSINGS) ×1 IMPLANT
YANKAUER SUCT BULB TIP NO VENT (SUCTIONS) ×1 IMPLANT

## 2022-10-10 NOTE — Transfer of Care (Signed)
Immediate Anesthesia Transfer of Care Note  Patient: [name]  Procedure(s) Performed: RIGHT TOTAL KNEE ARTHROPLASTY (Right: Knee)  Patient Location: PACU  Anesthesia Type:Spinal  Level of Consciousness: awake, alert , oriented, and patient cooperative  Airway & Oxygen Therapy: Patient Spontanous Breathing and Patient connected to face mask oxygen  Post-op Assessment: Report given to RN, Post -op Vital signs reviewed and stable, and Patient moving all extremities X 4  Post vital signs: Reviewed and stable  Last Vitals:  Vitals Value Taken Time  BP    Temp    Pulse    Resp    SpO2      Last Pain:  Vitals:   10/10/22 0934  TempSrc: Oral  PainSc:       Patients Stated Pain Goal: 4 (10/10/22 0932)  Complications: No notable events documented.

## 2022-10-10 NOTE — Op Note (Signed)
PREOP DIAGNOSIS: DJD RIGHT KNEE POSTOP DIAGNOSIS: same PROCEDURE: RIGHT TKR ANESTHESIA: Spinal and MAC ATTENDING SURGEON: Velna Ochs ASSISTANT: Elodia Florence PA  INDICATIONS FOR PROCEDURE: [name] is a 47 y.o. female who has struggled for a long time with pain due to degenerative arthritis of the right knee.  The patient has failed many conservative non-operative measures and at this point has pain which limits the ability to sleep and walk.  The patient is offered total knee replacement.  Informed operative consent was obtained after discussion of possible risks of anesthesia, infection, neurovascular injury, DVT, and death.  The importance of the post-operative rehabilitation protocol to optimize result was stressed extensively with the patient.  SUMMARY OF FINDINGS AND PROCEDURE:  [name] was taken to the operative suite where under the above anesthesia a right knee replacement was performed.  There were advanced degenerative changes and the bone quality was excellent.  We used the DePuy Attune system and placed size 7 femur, 7revision tibia, 35 mm all polyethylene patella, and a size 6 mm spacer.  Elodia Florence PA-C assisted throughout and was invaluable to the completion of the case in that he helped retract and maintain exposure while I placed components.  He also helped close thereby minimizing OR time.  The patient was admitted for appropriate post-op care to include perioperative antibiotics and mechanical and pharmacologic measures for DVT prophylaxis.  DESCRIPTION OF PROCEDURE:  [name] was taken to the operative suite where the above anesthesia was applied.  The patient was positioned supine and prepped and draped in normal sterile fashion.  An appropriate time out was performed.  After the administration of kefzol pre-op antibiotic the leg was elevated and exsanguinated and a tourniquet inflated. A standard longitudinal incision was made on the anterior knee.   Dissection was carried down to the extensor mechanism.  All appropriate anti-infective measures were used including the pre-operative antibiotic, betadine impregnated drape, and closed hooded exhaust systems for each member of the surgical team.  A medial parapatellar incision was made in the extensor mechanism and the knee cap flipped and the knee flexed.  Some residual meniscal tissues were removed along with any remaining ACL/PCL tissue.  A guide was placed on the tibia and a flat cut was made on it's superior surface.  An intramedullary guide was placed in the femur and was utilized to make anterior and posterior cuts creating an appropriate flexion gap.  A second intramedullary guide was placed in the femur to make a distal cut properly balancing the knee with an extension gap equal to the flexion gap.  The three bones sized to the above mentioned sizes and the appropriate guides were placed and utilized.  A trial reduction was done and the knee easily came to full extension and the patella tracked well on flexion.  The trial components were removed and all bones were cleaned with pulsatile lavage and then dried thoroughly.  Cement was mixed and was pressurized onto the bones followed by placement of the aforementioned components.  Excess cement was trimmed and pressure was held on the components until the cement had hardened.  The tourniquet was deflated and a small amount of bleeding was controlled with cautery and pressure.  The knee was irrigated thoroughly.  The extensor mechanism was re-approximated with #1 ethibond in interrupted fashion.  The knee was flexed and the repair was solid.  The subcutaneous tissues were re-approximated with #0 and #2-0 vicryl and the skin closed with  a subcuticular stitch and steristrips.  A sterile dressing was applied.  Intraoperative fluids, EBL, and tourniquet time can be obtained from anesthesia records.  DISPOSITION:  The patient was taken to recovery room in stable  condition and scheduled to potentially go home same day depending on ability to walk and tolerate liquids.[name] 10/10/2022, 1:48 PM

## 2022-10-10 NOTE — Anesthesia Preprocedure Evaluation (Signed)
Anesthesia Evaluation  Patient identified by MRN, date of birth, ID band Patient awake    Reviewed: Allergy & Precautions, NPO status , Patient's Chart, lab work & pertinent test results  Airway Mallampati: II  TM Distance: >3 FB Neck ROM: Full    Dental no notable dental hx.    Pulmonary PE   Pulmonary exam normal        Cardiovascular hypertension, Pt. on medications + DVT   Rhythm:Regular Rate:Normal     Neuro/Psych   Anxiety     negative neurological ROS     GI/Hepatic negative GI ROS, Neg liver ROS,,,  Endo/Other  negative endocrine ROS    Renal/GU negative Renal ROS  negative genitourinary   Musculoskeletal  (+) Arthritis , Osteoarthritis,    Abdominal Normal abdominal exam  (+)   Peds  Hematology  (+) HIVLab Results      Component                Value               Date                      WBC                      7.4                 09/29/2022                HGB                      14.1                09/29/2022                HCT                      43.2                09/29/2022                MCV                      90.0                09/29/2022                PLT                      311                 09/29/2022              Anesthesia Other Findings   Reproductive/Obstetrics                             Anesthesia Physical Anesthesia Plan  ASA: 3  Anesthesia Plan: MAC   Post-op Pain Management:    Induction: Intravenous  PONV Risk Score and Plan: 2 and Propofol infusion, Treatment may vary due to age or medical condition, Ondansetron, Dexamethasone and Midazolam  Airway Management Planned: Simple Face Mask and Nasal Cannula  Additional Equipment: None  Intra-op Plan:   Post-operative Plan:   Informed Consent: I have reviewed the patients History and Physical, chart, labs and discussed the procedure including the risks, benefits and alternatives for  the  proposed anesthesia with the patient or authorized representative who has indicated his/her understanding and acceptance.     Dental advisory given  Plan Discussed with: CRNA  Anesthesia Plan Comments:        Anesthesia Quick Evaluation

## 2022-10-10 NOTE — Anesthesia Postprocedure Evaluation (Signed)
Anesthesia Post Note  Patient: [name]  Procedure(s) Performed: RIGHT TOTAL KNEE ARTHROPLASTY (Right: Knee)     Patient location during evaluation: PACU Anesthesia Type: MAC, Regional and Spinal Level of consciousness: awake and alert Pain management: pain level controlled Vital Signs Assessment: post-procedure vital signs reviewed and stable Respiratory status: spontaneous breathing, nonlabored ventilation, respiratory function stable and patient connected to nasal cannula oxygen Cardiovascular status: stable and blood pressure returned to baseline Postop Assessment: no apparent nausea or vomiting Anesthetic complications: no   No notable events documented.  Last Vitals:  Vitals:   10/10/22 1530 10/10/22 1545  BP: 101/63 117/74  Pulse:    Resp: 11 14  Temp:    SpO2: 97% 99%    Last Pain:  Vitals:   10/10/22 1530  TempSrc:   PainSc: 0-No pain                 Earl Lites P Ritchard Paragas

## 2022-10-10 NOTE — Anesthesia Procedure Notes (Signed)
Spinal  Patient location during procedure: OR Start time: 10/10/2022 12:05 PM End time: 10/10/2022 12:10 PM Reason for block: surgical anesthesia Staffing Performed: resident/CRNA  Anesthesiologist: Atilano Median, DO Resident/CRNA: Shanon Payor, CRNA Performed by: Shanon Payor, CRNA Authorized by: Atilano Median, DO   Preanesthetic Checklist Completed: patient identified, IV checked, site marked, risks and benefits discussed, surgical consent, monitors and equipment checked, pre-op evaluation and timeout performed Spinal Block Patient position: sitting Prep: ChloraPrep Patient monitoring: heart rate, cardiac monitor, continuous pulse ox and blood pressure Approach: midline Location: L3-4 Injection technique: single-shot Needle Needle type: Pencan  Needle gauge: 24 G Assessment Sensory level: T6

## 2022-10-10 NOTE — Interval H&P Note (Signed)
History and Physical Interval Note:  10/10/2022 11:10 AM  [name]  has presented today for surgery, with the diagnosis of right knee degenerative joint disease.  The various methods of treatment have been discussed with the patient and family. After consideration of risks, benefits and other options for treatment, the patient has consented to  Procedure(s): RIGHT TOTAL KNEE ARTHROPLASTY (Right) as a surgical intervention.  The patient's history has been reviewed, patient examined, no change in status, stable for surgery.  I have reviewed the patient's chart and labs.  Questions were answered to the patient's satisfaction.     Velna Ochs

## 2022-10-10 NOTE — Anesthesia Procedure Notes (Signed)
Anesthesia Regional Block: Adductor canal block   Pre-Anesthetic Checklist: , timeout performed,  Correct Patient, Correct Site, Correct Laterality,  Correct Procedure, Correct Position, site marked,  Risks and benefits discussed,  Surgical consent,  Pre-op evaluation,  At surgeon's request and post-op pain management  Laterality: Right  Prep: Dura Prep       Needles:  Injection technique: Single-shot  Needle Type: Echogenic Stimulator Needle     Needle Length: 10cm  Needle Gauge: 20     Additional Needles:   Procedures:,,,, ultrasound used (permanent image in chart),,    Narrative:  Start time: 10/10/2022 11:30 AM End time: 10/10/2022 11:36 AM Injection made incrementally with aspirations every 5 mL.  Performed by: Personally  Anesthesiologist: Atilano Median, DO  Additional Notes: Patient identified. Risks/Benefits/Options discussed with patient including but not limited to bleeding, infection, nerve damage, failed block, incomplete pain control. Patient expressed understanding and wished to proceed. All questions were answered. Sterile technique was used throughout the entire procedure. Please see nursing notes for vital signs. Aspirated in 5cc intervals with injection for negative confirmation. Patient was given instructions on fall risk and not to get out of bed. All questions and concerns addressed with instructions to call with any issues or inadequate analgesia.

## 2022-10-10 NOTE — Progress Notes (Signed)
PT Note  Patient Details Name: [name] MRN: [id number] DOB: [birth year]   Cancelled Treatment:    Reason Eval/Treat Not Completed: Patient declined due to difficulty with pain management though out the day with pt reporting her pain was now manageable. Pt provided with morphine ~ 30 minutes prior to PT arrival. Pt also had just been set up for dinner. Pt is aware that PT is anticipated to assess pt 10/11/2022 and in agreement. Nursing staff aware.  Johnny Bridge, PT Acute Rehab   Jacqualyn Posey 10/10/2022, 7:06 PM

## 2022-10-11 ENCOUNTER — Encounter (HOSPITAL_COMMUNITY): Payer: Self-pay | Admitting: Orthopaedic Surgery

## 2022-10-11 DIAGNOSIS — M1711 Unilateral primary osteoarthritis, right knee: Secondary | ICD-10-CM | POA: Diagnosis not present

## 2022-10-11 MED ORDER — ENOXAPARIN SODIUM 30 MG/0.3ML IJ SOSY: 30.0000 mg | PREFILLED_SYRINGE | Freq: Two times a day (BID) | INTRAMUSCULAR | 0 refills | Status: DC

## 2022-10-11 MED ORDER — HYDROCODONE-ACETAMINOPHEN 5-325 MG PO TABS: 1.0000 | ORAL_TABLET | Freq: Four times a day (QID) | ORAL | 0 refills | Status: DC | PRN

## 2022-10-11 MED ORDER — TIZANIDINE HCL 4 MG PO TABS: 4.0000 mg | ORAL_TABLET | Freq: Four times a day (QID) | ORAL | 1 refills | Status: DC | PRN

## 2022-10-11 MED ORDER — ENOXAPARIN SODIUM 30 MG/0.3ML IJ SOSY
30.0000 mg | PREFILLED_SYRINGE | Freq: Two times a day (BID) | INTRAMUSCULAR | Status: DC
  Administered 2022-10-11: 30 mg via SUBCUTANEOUS
  Filled 2022-10-11: qty 0.3

## 2022-10-11 NOTE — TOC Transition Note (Signed)
Transition of Care [facility]) - CM/SW Discharge Note  Patient Details  Name: [name] MRN: [id number] Date of Birth: 01/19/1976  Transition of Care [facility]) CM/SW Contact:  Ewing Schlein, LCSW Phone Number: 10/11/2022, 11:26 AM  Clinical Narrative: Patient is expected to discharge after working with PT. CSW met with patient to confirm discharge plan. Patient reported she will do [facility]PP[facility] at the [facility]. Patient has a rolling walker, so there are no DME needs at this time. TOC signing off at this time.    Final next level of care: OP Rehab Barriers to Discharge: No Barriers Identified  Patient Goals and CMS Choice Choice offered to / list presented to : NA  Discharge Plan and Services Additional resources added to the After Visit Summary for        DME Arranged: N/A DME Agency: NA  Social Determinants of Health (SDOH) Interventions SDOH Screenings   Food Insecurity: No Food Insecurity (10/10/2022)  Housing: Low Risk  (10/10/2022)  Transportation Needs: No Transportation Needs (10/10/2022)  Utilities: Not At Risk (10/10/2022)  Depression (PHQ2-9): Low Risk  (04/19/2022)  Tobacco Use: Low Risk  (10/11/2022)   Readmission Risk Interventions     No data to display

## 2022-10-11 NOTE — Progress Notes (Signed)
PT NOTE  10/11/22 1100  PT Visit Information  Last PT Received On 10/11/22  Assistance Needed Pt making good progress, meeting goals; feels ready to d/c with family assist as needed   History of Present Illness 47 yo female presents to therapy s/p R TKA on 10/10/2022 due to failure of conservative measures. Pt PMH includes but is not limited to: R LE DVT, B PE, granulomatous adenopathy, HIV, and R ankle fx.  Subjective Data  Patient Stated Goal home soon  Precautions  Precautions Fall;Knee  Restrictions  Weight Bearing Restrictions No  RLE Weight Bearing WBAT  Pain Assessment  Pain Assessment 0-10  Pain Score 5  Pain Location right knee  Pain Descriptors / Indicators Aching;Grimacing;Guarding;Sore  Pain Intervention(s) Limited activity within patient's tolerance;Monitored during session;Premedicated before session;Repositioned;Ice applied  Cognition  Arousal/Alertness Awake/alert  Behavior During Therapy WFL for tasks assessed/performed  Overall Cognitive Status Within Functional Limits for tasks assessed  Bed Mobility  General bed mobility comments in recliner  Transfers  Overall transfer level Needs assistance  Equipment used Rolling walker (2 wheels)  Transfers Sit to/from Stand  Sit to Stand Supervision  General transfer comment cues for hand placement and RLE position  Ambulation/Gait  Ambulation/Gait assistance Supervision;Modified independent (Device/Increase time)  Gait Distance (Feet) 40 Feet  Assistive device Rolling walker (2 wheels)  Gait Pattern/deviations Step-to pattern  General Gait Details cues for sequence and RW position. good gait stability, no knee buckling; demonstrates carryover from previous session  Stairs Yes  Stairs assistance Min guard;Min assist  Stair Management No rails;Step to pattern;Backwards;With walker  Number of Stairs 1  General stair comments attempted forwards however pt in too much pain; cues for posterior technique; pt performed with  cues for sequence and technique; good stability, pain better controlled with posterior technique  PT - End of Session  Equipment Utilized During Treatment Gait belt  Activity Tolerance Patient tolerated treatment well  Patient left with call bell/phone within reach;with chair alarm set;in chair;with family/visitor present   PT - Assessment/Plan  PT Plan Current plan remains appropriate  PT Visit Diagnosis Other abnormalities of gait and mobility (R26.89);Difficulty in walking, not elsewhere classified (R26.2)  PT Frequency (ACUTE ONLY) 7X/week  Follow Up Recommendations Follow physician's recommendations for discharge plan and follow up therapies  Assistance recommended at discharge Intermittent Supervision/Assistance  Patient can return home with the following A little help with bathing/dressing/bathroom;Assistance with cooking/housework;Help with stairs or ramp for entrance  PT equipment None recommended by PT  AM-PAC PT "6 Clicks" Mobility Outcome Measure (Version 2)  Help needed turning from your back to your side while in a flat bed without using bedrails? 3  Help needed moving from lying on your back to sitting on the side of a flat bed without using bedrails? 3  Help needed moving to and from a bed to a chair (including a wheelchair)? 3  Help needed standing up from a chair using your arms (e.g., wheelchair or bedside chair)? 3  Help needed to walk in hospital room? 3  Help needed climbing 3-5 steps with a railing?  3  6 Click Score 18  Consider Recommendation of Discharge To: Home with [facility]  PT Goal Progression  Progress towards PT goals Progressing toward goals  Acute Rehab PT Goals  PT Goal Formulation With patient  Time For Goal Achievement 10/13/22  Potential to Achieve Goals Good  PT Time Calculation  PT Start Time (ACUTE ONLY) 1049  PT Stop Time (ACUTE ONLY) 1104  PT  Time Calculation (min) (ACUTE ONLY) 15 min  PT General Charges  $$ ACUTE PT VISIT 1 Visit  PT Treatments   $Gait Training 8-22 mins

## 2022-10-11 NOTE — Plan of Care (Signed)
Problem: Education: Goal: Knowledge of the prescribed therapeutic regimen will improve Outcome: Adequate for Discharge   Problem: Activity: Goal: Ability to avoid complications of mobility impairment will improve Outcome: Adequate for Discharge Goal: Range of joint motion will improve Outcome: Adequate for Discharge   Problem: Clinical Measurements: Goal: Postoperative complications will be avoided or minimized Outcome: Adequate for Discharge   Problem: Pain Management: Goal: Pain level will decrease with appropriate interventions Outcome: Adequate for Discharge   Problem: Skin Integrity: Goal: Will show signs of wound healing Outcome: Adequate for Discharge   Problem: Education: Goal: Knowledge of General Education information will improve Description: Including pain rating scale, medication(s)/side effects and non-pharmacologic comfort measures Outcome: Adequate for Discharge   Problem: Health Behavior/Discharge Planning: Goal: Ability to manage health-related needs will improve Outcome: Adequate for Discharge   Problem: Clinical Measurements: Goal: Ability to maintain clinical measurements within normal limits will improve Outcome: Adequate for Discharge Goal: Will remain free from infection Outcome: Adequate for Discharge Goal: Diagnostic test results will improve Outcome: Adequate for Discharge Goal: Respiratory complications will improve Outcome: Adequate for Discharge Goal: Cardiovascular complication will be avoided Outcome: Adequate for Discharge   Problem: Activity: Goal: Risk for activity intolerance will decrease Outcome: Adequate for Discharge   Problem: Coping: Goal: Level of anxiety will decrease Outcome: Adequate for Discharge   Problem: Elimination: Goal: Will not experience complications related to bowel motility Outcome: Adequate for Discharge Goal: Will not experience complications related to urinary retention Outcome: Adequate for  Discharge   Problem: Pain Managment: Goal: General experience of comfort will improve Outcome: Adequate for Discharge   Problem: Safety: Goal: Ability to remain free from injury will improve Outcome: Adequate for Discharge   Problem: Skin Integrity: Goal: Risk for impaired skin integrity will decrease Outcome: Adequate for Discharge   Problem: Education: Goal: Knowledge of the prescribed therapeutic regimen will improve Outcome: Adequate for Discharge   Problem: Activity: Goal: Ability to avoid complications of mobility impairment will improve Outcome: Adequate for Discharge Goal: Range of joint motion will improve Outcome: Adequate for Discharge   Problem: Clinical Measurements: Goal: Postoperative complications will be avoided or minimized Outcome: Adequate for Discharge   Problem: Pain Management: Goal: Pain level will decrease with appropriate interventions Outcome: Adequate for Discharge   Problem: Skin Integrity: Goal: Will show signs of wound healing Outcome: Adequate for Discharge   Problem: Education: Goal: Knowledge of General Education information will improve Description: Including pain rating scale, medication(s)/side effects and non-pharmacologic comfort measures Outcome: Adequate for Discharge   Problem: Health Behavior/Discharge Planning: Goal: Ability to manage health-related needs will improve Outcome: Adequate for Discharge   Problem: Clinical Measurements: Goal: Ability to maintain clinical measurements within normal limits will improve Outcome: Adequate for Discharge Goal: Will remain free from infection Outcome: Adequate for Discharge Goal: Diagnostic test results will improve Outcome: Adequate for Discharge Goal: Respiratory complications will improve Outcome: Adequate for Discharge Goal: Cardiovascular complication will be avoided Outcome: Adequate for Discharge   Problem: Activity: Goal: Risk for activity intolerance will  decrease Outcome: Adequate for Discharge   Problem: Nutrition: Goal: Adequate nutrition will be maintained Outcome: Adequate for Discharge   Problem: Coping: Goal: Level of anxiety will decrease Outcome: Adequate for Discharge   Problem: Elimination: Goal: Will not experience complications related to bowel motility Outcome: Adequate for Discharge Goal: Will not experience complications related to urinary retention Outcome: Adequate for Discharge   Problem: Pain Managment: Goal: General experience of comfort will improve Outcome: Adequate for Discharge  Problem: Safety: Goal: Ability to remain free from injury will improve Outcome: Adequate for Discharge   Problem: Skin Integrity: Goal: Risk for impaired skin integrity will decrease Outcome: Adequate for Discharge

## 2022-10-11 NOTE — Progress Notes (Signed)
Subjective: 1 Day Post-Op Procedure(s) (LRB): RIGHT TOTAL KNEE ARTHROPLASTY (Right)  Patient doing well this morning. Pain is much better controlled. She is hoping to go home today.  Activity level:  wbat Diet tolerance:  ok Voiding:  ok Patient reports pain as mild.    Objective: Vital signs in last 24 hours: Temp:  [97.5 F (36.4 C)-98.6 F (37 C)] 98.2 F (36.8 C) (06/26 0523) Pulse Rate:  [55-87] 87 (06/26 0523) Resp:  [11-20] 18 (06/26 0523) BP: (100-145)/(57-98) 118/72 (06/26 0523) SpO2:  [94 %-100 %] 94 % (06/26 0523) Weight:  [121.1 kg] 121.1 kg (06/25 0932)  Labs: No results for input(s): "HGB" in the last 72 hours. No results for input(s): "WBC", "RBC", "HCT", "PLT" in the last 72 hours. No results for input(s): "NA", "K", "CL", "CO2", "BUN", "CREATININE", "GLUCOSE", "CALCIUM" in the last 72 hours. No results for input(s): "LABPT", "INR" in the last 72 hours.  Physical Exam:  Neurologically intact ABD soft Neurovascular intact Sensation intact distally Intact pulses distally Dorsiflexion/Plantar flexion intact Incision: dressing C/D/I No cellulitis present Compartment soft  Assessment/Plan:  1 Day Post-Op Procedure(s) (LRB): RIGHT TOTAL KNEE ARTHROPLASTY (Right) Advance diet Up with therapy D/C IV fluids Discharge home with home health today if cleared by PT. We will use lovenox injections 30mg  BID for 4 weeks for dvt prevention. Follow up in office 2 weeks post op.   Ginger Organ Emmauel Hallums 10/11/2022, 8:17 AM

## 2022-10-11 NOTE — Evaluation (Signed)
Physical Therapy Evaluation Patient Details Name: [name] MRN: [id number] DOB: [birth year] Today's Date: 10/11/2022  History of Present Illness  47 yo female presents to therapy s/p R TKA on 10/10/2022 due to failure of conservative measures. Pt PMH includes but is not limited to: R LE DVT, B PE, granulomatous adenopathy, HIV, and R ankle fx.  Clinical Impression  Progressing toward goals, will see again for stair review; pt will likely d/c today        Recommendations for follow up therapy are one component of a multi-disciplinary discharge planning process, led by the attending physician.  Recommendations may be updated based on patient status, additional functional criteria and insurance authorization.  Follow Up Recommendations       Assistance Recommended at Discharge    Patient can return home with the following  A little help with bathing/dressing/bathroom;Assistance with cooking/housework;Help with stairs or ramp for entrance    Equipment Recommendations None recommended by PT  Recommendations for Other Services       Functional Status Assessment Patient has had a recent decline in their functional status and demonstrates the ability to make significant improvements in function in a reasonable and predictable amount of time.     Precautions / Restrictions Precautions Precautions: Fall;Knee Restrictions Weight Bearing Restrictions: No RLE Weight Bearing: Weight bearing as tolerated      Mobility  Bed Mobility Overal bed mobility: Needs Assistance Bed Mobility: Supine to Sit     Supine to sit: Supervision     General bed mobility comments: able to self assist LLE with gait belt    Transfers Overall transfer level: Needs assistance Equipment used: Rolling walker (2 wheels) Transfers: Sit to/from Stand Sit to Stand: Min guard           General transfer comment: cues for hand placement and RLE position, min/guard for safety     Ambulation/Gait Ambulation/Gait assistance: Min guard, Supervision Gait Distance (Feet): 85 Feet Assistive device: Rolling walker (2 wheels) Gait Pattern/deviations: Step-to pattern       General Gait Details: cues for sequence and RW position. good gait stability, no knee buckling  Stairs            Wheelchair Mobility    Modified Rankin (Stroke Patients Only)       Balance                                             Pertinent Vitals/Pain Pain Assessment Pain Assessment: Faces Faces Pain Scale: Hurts little more Pain Location: right knee Pain Descriptors / Indicators: Aching, Grimacing, Guarding Pain Intervention(s): Limited activity within patient's tolerance, Monitored during session, Premedicated before session, Repositioned    Home Living Family/patient expects to be discharged to:: Private residence Living Arrangements: Alone Available Help at Discharge: Family Type of Home: Other(Comment) Home Access: Stairs to enter   Secretary/administrator of Steps: curb   Home Layout: One level Home Equipment: Agricultural consultant (2 wheels)      Prior Function Prior Level of Function : Independent/Modified Independent                     Hand Dominance        Extremity/Trunk Assessment   Upper Extremity Assessment Upper Extremity Assessment: Overall WFL for tasks assessed    Lower Extremity Assessment Lower Extremity Assessment: RLE deficits/detail RLE Deficits / Details: ankle  WFL, knee and hip grossly 3/5, limited by post op pain and weakness as anticipated       Communication   Communication: No difficulties  Cognition Arousal/Alertness: Awake/alert Behavior During Therapy: WFL for tasks assessed/performed Overall Cognitive Status: Within Functional Limits for tasks assessed                                          General Comments      Exercises Total Joint Exercises Ankle Circles/Pumps: AROM,  Both, 10 reps Quad Sets: AROM, Both, 10 reps Heel Slides: AAROM, AROM, Right, 10 reps Straight Leg Raises: AROM, AAROM, Right, 10 reps   Assessment/Plan    PT Assessment Patient needs continued PT services  PT Problem List Decreased strength;Decreased range of motion;Decreased activity tolerance;Decreased mobility;Decreased knowledge of precautions;Pain       PT Treatment Interventions DME instruction;Therapeutic exercise;Gait training;Functional mobility training;Therapeutic activities;Patient/family education;Stair training    PT Goals (Current goals can be found in the Care Plan section)  Acute Rehab PT Goals Patient Stated Goal: home soon PT Goal Formulation: With patient Time For Goal Achievement: 10/13/22 Potential to Achieve Goals: Good    Frequency 7X/week     Co-evaluation               AM-PAC PT "6 Clicks" Mobility  Outcome Measure Help needed turning from your back to your side while in a flat bed without using bedrails?: A Little Help needed moving from lying on your back to sitting on the side of a flat bed without using bedrails?: A Little Help needed moving to and from a bed to a chair (including a wheelchair)?: A Little Help needed standing up from a chair using your arms (e.g., wheelchair or bedside chair)?: A Little Help needed to walk in hospital room?: A Little Help needed climbing 3-5 steps with a railing? : A Little 6 Click Score: 18    End of Session Equipment Utilized During Treatment: Gait belt Activity Tolerance: Patient tolerated treatment well Patient left: with call bell/phone within reach;with chair alarm set;in chair;with family/visitor present   PT Visit Diagnosis: Other abnormalities of gait and mobility (R26.89);Difficulty in walking, not elsewhere classified (R26.2)    Time: 1610-9604 PT Time Calculation (min) (ACUTE ONLY): 23 min   Charges:   PT Treatments $Gait Training: 8-22 mins        Delice Bison, PT  Acute Rehab Dept  [facility]) [phone]  10/11/2022   [facility] 10/11/2022, 10:47 AM

## 2022-10-11 NOTE — Discharge Summary (Signed)
Patient ID: [name] MRN: [id number] DOB/AGE: May 14, 1975 47 y.o.  Admit date: 10/10/2022 Discharge date: 10/11/2022  Admission Diagnoses:  Principal Problem:   Primary osteoarthritis of right knee Active Problems:   Primary localized osteoarthritis of right knee   Discharge Diagnoses:  Same  Past Medical History:  Diagnosis Date   Anxiety    Arthritis    Candidal esophagitis (HCC)    Complication of anesthesia 07/2011   in PACU took long time for oxygen sts to come up,2008 tonsillectomy went to ICU   Deep vein blood clot of right lower extremity (HCC)    Family history of adverse reaction to anesthesia    mother H/O n/v   Fracture of right ankle, lateral malleolus    HIV (human immunodeficiency virus infection) (HCC)    Hypertension    MAC (mycobacterium avium-intracellulare complex)    positive culture, treated for 3 months   Pulmonary embolism (HCC)     Surgeries: Procedure(s): RIGHT TOTAL KNEE ARTHROPLASTY on 10/10/2022   Consultants:   Discharged Condition: Improved  Hospital Course: [name] is an 47 y.o. female who was admitted 10/10/2022 for operative treatment ofPrimary osteoarthritis of right knee. Patient has severe unremitting pain that affects sleep, daily activities, and work/hobbies. After pre-op clearance the patient was taken to the operating room on 10/10/2022 and underwent  Procedure(s): RIGHT TOTAL KNEE ARTHROPLASTY.    Patient was given perioperative antibiotics:  Anti-infectives (From admission, onward)    Start     Dose/Rate Route Frequency Ordered Stop   10/11/22 0800  darunavir (PREZISTA) tablet 800 mg        800 mg Oral Daily with breakfast 10/10/22 1621     10/10/22 1800  elvitegravir-cobicistat-emtricitabine-tenofovir (GENVOYA) 150-150-200-10 MG tablet 1 tablet        1 tablet Oral Daily 10/10/22 1621     10/10/22 1730  ceFAZolin (ANCEF) IVPB 2g/100 mL premix        2 g 200 mL/hr over 30 Minutes Intravenous Every 6 hours  10/10/22 1425 10/11/22 0030   10/10/22 0915  ceFAZolin (ANCEF) IVPB 3g/100 mL premix        3 g 200 mL/hr over 30 Minutes Intravenous On call to O.R. 10/10/22 0909 10/10/22 1159        Patient was given sequential compression devices, early ambulation, and chemoprophylaxis to prevent DVT.  Patient benefited maximally from hospital stay and there were no complications.    Recent vital signs: Patient Vitals for the past 24 hrs:  BP Temp Temp src Pulse Resp SpO2 Height Weight  10/11/22 0523 118/72 98.2 F (36.8 C) Oral 87 18 94 % -- --  10/11/22 0027 139/85 98.1 F (36.7 C) Oral (!) 55 16 99 % -- --  10/10/22 2059 135/84 98.4 F (36.9 C) Oral 71 18 97 % -- --  10/10/22 1827 (!) 145/88 98.6 F (37 C) -- 66 17 97 % -- --  10/10/22 1629 134/75 (!) 97.5 F (36.4 C) Oral -- 14 96 % -- --  10/10/22 1545 117/74 -- -- -- 14 99 % -- --  10/10/22 1530 101/63 -- -- -- 11 97 % -- --  10/10/22 1515 121/67 -- -- -- 12 97 % -- --  10/10/22 1500 (!) 117/97 -- -- -- 17 99 % -- --  10/10/22 1445 (!) 100/57 -- -- -- 16 99 % -- --  10/10/22 1430 135/69 97.7 F (36.5 C) -- -- 20 100 % -- --  10/10/22 1415 (!) 116/98 -- -- --  16 100 % -- --  10/10/22 1145 -- -- -- -- 18 -- -- --  10/10/22 0935 -- -- -- 79 18 96 % -- --  10/10/22 0934 (!) 145/69 98 F (36.7 C) Oral 80 18 97 % -- --  10/10/22 0932 -- -- -- -- -- -- 5\' 10"  (1.778 m) 121.1 kg     Recent laboratory studies: No results for input(s): "WBC", "HGB", "HCT", "PLT", "NA", "K", "CL", "CO2", "BUN", "CREATININE", "GLUCOSE", "INR", "CALCIUM" in the last 72 hours.  Invalid input(s): "PT", "2"   Discharge Medications:   Allergies as of 10/11/2022   No Known Allergies      Medication List     TAKE these medications    ALPRAZolam 0.25 MG tablet Commonly known as: XANAX Take 0.25 mg by mouth daily as needed for anxiety.   cetirizine 10 MG tablet Commonly known as: ZYRTEC Take 10 mg by mouth at bedtime as needed for allergies.    clotrimazole-betamethasone cream Commonly known as: LOTRISONE Apply 1 Application topically 2 (two) times daily as needed (rash).   darunavir 800 MG tablet Commonly known as: PREZISTA TAKE 1 TABLET(800 MG) BY MOUTH DAILY   enoxaparin 30 MG/0.3ML injection Commonly known as: LOVENOX Inject 0.3 mLs (30 mg total) into the skin every 12 (twelve) hours.   Genvoya 150-150-200-10 MG Tabs tablet Generic drug: elvitegravir-cobicistat-emtricitabine-tenofovir TAKE 1 TABLET BY MOUTH DAILY   hydrochlorothiazide 12.5 MG capsule Commonly known as: MICROZIDE Take 1 capsule (12.5 mg total) by mouth daily.   HYDROcodone-acetaminophen 5-325 MG tablet Commonly known as: NORCO/VICODIN Take 1-2 tablets by mouth every 6 (six) hours as needed for moderate pain or severe pain (post op pain).   tiZANidine 4 MG tablet Commonly known as: Zanaflex Take 1 tablet (4 mg total) by mouth every 6 (six) hours as needed for muscle spasms.   TYLENOL SINUS+HEADACHE PO Take 2 tablets by mouth daily as needed (congestion).               Durable Medical Equipment  (From admission, onward)           Start     Ordered   10/10/22 1622  DME Walker rolling  Once       Question:  Patient needs a walker to treat with the following condition  Answer:  Primary osteoarthritis of right knee   10/10/22 1621   10/10/22 1622  DME 3 n 1  Once        10/10/22 1621   10/10/22 1622  DME Bedside commode  Once       Question:  Patient needs a bedside commode to treat with the following condition  Answer:  Primary osteoarthritis of right knee   10/10/22 1621            Diagnostic Studies: DG Chest 2 View  Result Date: 10/05/2022 CLINICAL DATA:  Preop EXAM: CHEST - 2 VIEW COMPARISON:  Radiographs 08/28/2018 FINDINGS: The cardiomediastinal contours are normal. The lungs are clear. Pulmonary vasculature is normal. No consolidation, pleural effusion, or pneumothorax. No acute osseous abnormalities are seen.  IMPRESSION: Negative radiographs of the chest. Electronically Signed   By: Narda Rutherford M.D.   On: 10/05/2022 12:29    Disposition: Discharge disposition: 01-Home or Self Care       Discharge Instructions     Call MD / Call 911   Complete by: As directed    If you experience chest pain or shortness of breath, CALL 911 and be transported to  the hospital emergency room.  If you develope a fever above 101 F, pus (white drainage) or increased drainage or redness at the wound, or calf pain, call your surgeon's office.   Constipation Prevention   Complete by: As directed    Drink plenty of fluids.  Prune juice may be helpful.  You may use a stool softener, such as Colace (over the counter) 100 mg twice a day.  Use MiraLax (over the counter) for constipation as needed.   Diet - low sodium heart healthy   Complete by: As directed    Discharge instructions   Complete by: As directed    INSTRUCTIONS AFTER JOINT REPLACEMENT   Remove items at home which could result in a fall. This includes throw rugs or furniture in walking pathways ICE to the affected joint every three hours while awake for 30 minutes at a time, for at least the first 3-5 days, and then as needed for pain and swelling.  Continue to use ice for pain and swelling. You may notice swelling that will progress down to the foot and ankle.  This is normal after surgery.  Elevate your leg when you are not up walking on it.   Continue to use the breathing machine you got in the hospital (incentive spirometer) which will help keep your temperature down.  It is common for your temperature to cycle up and down following surgery, especially at night when you are not up moving around and exerting yourself.  The breathing machine keeps your lungs expanded and your temperature down.   DIET:  As you were doing prior to hospitalization, we recommend a well-balanced diet.  DRESSING / WOUND CARE / SHOWERING  You may shower 3 days after surgery,  but keep the wounds dry during showering.  You may use an occlusive plastic wrap (Press'n Seal for example), NO SOAKING/SUBMERGING IN THE BATHTUB.  If the bandage gets wet, change with a clean dry gauze.  If the incision gets wet, pat the wound dry with a clean towel.  ACTIVITY  Increase activity slowly as tolerated, but follow the weight bearing instructions below.   No driving for 6 weeks or until further direction given by your physician.  You cannot drive while taking narcotics.  No lifting or carrying greater than 10 lbs. until further directed by your surgeon. Avoid periods of inactivity such as sitting longer than an hour when not asleep. This helps prevent blood clots.  You may return to work once you are authorized by your doctor.     WEIGHT BEARING   Weight bearing as tolerated with assist device (walker, cane, etc) as directed, use it as long as suggested by your surgeon or therapist, typically at least 4-6 weeks.   EXERCISES  Results after joint replacement surgery are often greatly improved when you follow the exercise, range of motion and muscle strengthening exercises prescribed by your doctor. Safety measures are also important to protect the joint from further injury. Any time any of these exercises cause you to have increased pain or swelling, decrease what you are doing until you are comfortable again and then slowly increase them. If you have problems or questions, call your caregiver or physical therapist for advice.   Rehabilitation is important following a joint replacement. After just a few days of immobilization, the muscles of the leg can become weakened and shrink (atrophy).  These exercises are designed to build up the tone and strength of the thigh and leg muscles and to  improve motion. Often times heat used for twenty to thirty minutes before working out will loosen up your tissues and help with improving the range of motion but do not use heat for the first two  weeks following surgery (sometimes heat can increase post-operative swelling).   These exercises can be done on a training (exercise) mat, on the floor, on a table or on a bed. Use whatever works the best and is most comfortable for you.    Use music or television while you are exercising so that the exercises are a pleasant break in your day. This will make your life better with the exercises acting as a break in your routine that you can look forward to.   Perform all exercises about fifteen times, three times per day or as directed.  You should exercise both the operative leg and the other leg as well.  Exercises include:   Quad Sets - Tighten up the muscle on the front of the thigh (Quad) and hold for 5-10 seconds.   Straight Leg Raises - With your knee straight (if you were given a brace, keep it on), lift the leg to 60 degrees, hold for 3 seconds, and slowly lower the leg.  Perform this exercise against resistance later as your leg gets stronger.  Leg Slides: Lying on your back, slowly slide your foot toward your buttocks, bending your knee up off the floor (only go as far as is comfortable). Then slowly slide your foot back down until your leg is flat on the floor again.  Angel Wings: Lying on your back spread your legs to the side as far apart as you can without causing discomfort.  Hamstring Strength:  Lying on your back, push your heel against the floor with your leg straight by tightening up the muscles of your buttocks.  Repeat, but this time bend your knee to a comfortable angle, and push your heel against the floor.  You may put a pillow under the heel to make it more comfortable if necessary.   A rehabilitation program following joint replacement surgery can speed recovery and prevent re-injury in the future due to weakened muscles. Contact your doctor or a physical therapist for more information on knee rehabilitation.    CONSTIPATION  Constipation is defined medically as fewer than  three stools per week and severe constipation as less than one stool per week.  Even if you have a regular bowel pattern at home, your normal regimen is likely to be disrupted due to multiple reasons following surgery.  Combination of anesthesia, postoperative narcotics, change in appetite and fluid intake all can affect your bowels.   YOU MUST use at least one of the following options; they are listed in order of increasing strength to get the job done.  They are all available over the counter, and you may need to use some, POSSIBLY even all of these options:    Drink plenty of fluids (prune juice may be helpful) and high fiber foods Colace 100 mg by mouth twice a day  Senokot for constipation as directed and as needed Dulcolax (bisacodyl), take with full glass of water  Miralax (polyethylene glycol) once or twice a day as needed.  If you have tried all these things and are unable to have a bowel movement in the first 3-4 days after surgery call either your surgeon or your primary doctor.    If you experience loose stools or diarrhea, hold the medications until you stool forms back  up.  If your symptoms do not get better within 1 week or if they get worse, check with your doctor.  If you experience "the worst abdominal pain ever" or develop nausea or vomiting, please contact the office immediately for further recommendations for treatment.   ITCHING:  If you experience itching with your medications, try taking only a single pain pill, or even half a pain pill at a time.  You can also use Benadryl over the counter for itching or also to help with sleep.   TED HOSE STOCKINGS:  Use stockings on both legs until for at least 2 weeks or as directed by physician office. They may be removed at night for sleeping.  MEDICATIONS:  See your medication summary on the "After Visit Summary" that nursing will review with you.  You may have some home medications which will be placed on hold until you complete the  course of blood thinner medication.  It is important for you to complete the blood thinner medication as prescribed.  PRECAUTIONS:  If you experience chest pain or shortness of breath - call 911 immediately for transfer to the hospital emergency department.   If you develop a fever greater that 101 F, purulent drainage from wound, increased redness or drainage from wound, foul odor from the wound/dressing, or calf pain - CONTACT YOUR SURGEON.                                                   FOLLOW-UP APPOINTMENTS:  If you do not already have a post-op appointment, please call the office for an appointment to be seen by your surgeon.  Guidelines for how soon to be seen are listed in your "After Visit Summary", but are typically between 1-4 weeks after surgery.  OTHER INSTRUCTIONS:   Knee Replacement:  Do not place pillow under knee, focus on keeping the knee straight while resting. CPM instructions: 0-90 degrees, 2 hours in the morning, 2 hours in the afternoon, and 2 hours in the evening. Place foam block, curve side up under heel at all times except when in CPM or when walking.  DO NOT modify, tear, cut, or change the foam block in any way.  POST-OPERATIVE OPIOID TAPER INSTRUCTIONS: It is important to wean off of your opioid medication as soon as possible. If you do not need pain medication after your surgery it is ok to stop day one. Opioids include: Codeine, Hydrocodone(Norco, Vicodin), Oxycodone(Percocet, oxycontin) and hydromorphone amongst others.  Long term and even short term use of opiods can cause: Increased pain response Dependence Constipation Depression Respiratory depression And more.  Withdrawal symptoms can include Flu like symptoms Nausea, vomiting And more Techniques to manage these symptoms Hydrate well Eat regular healthy meals Stay active Use relaxation techniques(deep breathing, meditating, yoga) Do Not substitute Alcohol to help with tapering If you have been  on opioids for less than two weeks and do not have pain than it is ok to stop all together.  Plan to wean off of opioids This plan should start within one week post op of your joint replacement. Maintain the same interval or time between taking each dose and first decrease the dose.  Cut the total daily intake of opioids by one tablet each day Next start to increase the time between doses. The last dose that should be eliminated is  the evening dose.     MAKE SURE YOU:  Understand these instructions.  Get help right away if you are not doing well or get worse.    Thank you for letting us be a part of your medical care team.  It is a privilege we respect greatly.  We hope these instructions will help you stay on track for a fast and full recovery!   Increase activity slowly as tolerated   Complete by: As directed    Post-operative opioid taper instructions:   Complete by: As directed    POST-OPERATIVE OPIOID TAPER INSTRUCTIONS: It is important to wean off of your opioid medication as soon as possible. If you do not need pain medication after your surgery it is ok to stop day one. Opioids include: Codeine, Hydrocodone(Norco, Vicodin), Oxycodone(Percocet, oxycontin) and hydromorphone amongst others.  Long term and even short term use of opiods can cause: Increased pain response Dependence Constipation Depression Respiratory depression And more.  Withdrawal symptoms can include Flu like symptoms Nausea, vomiting And more Techniques to manage these symptoms Hydrate well Eat regular healthy meals Stay active Use relaxation techniques(deep breathing, meditating, yoga) Do Not substitute Alcohol to help with tapering If you have been on opioids for less than two weeks and do not have pain than it is ok to stop all together.  Plan to wean off of opioids This plan should start within one week post op of your joint replacement. Maintain the same interval or time between taking each  dose and first decrease the dose.  Cut the total daily intake of opioids by one tablet each day Next start to increase the time between doses. The last dose that should be eliminated is the evening dose.           Follow-up Information     Marcene Corning, MD. Schedule an appointment as soon as possible for a visit in 2 week(s).   Specialty: Orthopedic Surgery Contact information: [address] [place] [address] [phone]                  Signed: Ginger Organ Jasiya Markie 10/11/2022, 8:24 AM

## 2022-10-31 ENCOUNTER — Other Ambulatory Visit: Payer: No Typology Code available for payment source

## 2022-11-01 ENCOUNTER — Other Ambulatory Visit: Payer: Self-pay

## 2022-11-01 ENCOUNTER — Other Ambulatory Visit: Payer: No Typology Code available for payment source

## 2022-11-01 DIAGNOSIS — Z113 Encounter for screening for infections with a predominantly sexual mode of transmission: Secondary | ICD-10-CM

## 2022-11-01 DIAGNOSIS — Z79899 Other long term (current) drug therapy: Secondary | ICD-10-CM

## 2022-11-01 DIAGNOSIS — B20 Human immunodeficiency virus [HIV] disease: Secondary | ICD-10-CM

## 2022-11-02 LAB — COMPLETE METABOLIC PANEL WITH GFR
AG Ratio: 1.3 (calc) (ref 1.0–2.5)
AST: 13 U/L (ref 10–35)
BUN: 15 mg/dL (ref 7–25)
CO2: 30 mmol/L (ref 20–32)
Calcium: 9.5 mg/dL (ref 8.6–10.2)
Creat: 0.81 mg/dL (ref 0.50–0.99)
Potassium: 4.4 mmol/L (ref 3.5–5.3)
Total Bilirubin: 0.3 mg/dL (ref 0.2–1.2)
Total Protein: 7.2 g/dL (ref 6.1–8.1)

## 2022-11-02 LAB — LIPID PANEL
HDL: 45 mg/dL — ABNORMAL LOW (ref >=50)
Non-HDL Cholesterol (Calc): 178 mg/dL (calc) — ABNORMAL HIGH (ref <130)

## 2022-11-02 LAB — T-HELPER CELL (CD4) - (RCID CLINIC ONLY)
CD4 % Helper T Cell: 32 % — ABNORMAL LOW (ref 33–65)
CD4 T Cell Abs: 810 /uL (ref 400–1790)

## 2022-11-04 LAB — CBC WITH DIFFERENTIAL/PLATELET
Absolute Monocytes: 724 cells/uL (ref 200–950)
Basophils Absolute: 66 cells/uL (ref 0–200)
Basophils Relative: 0.7 %
Eosinophils Absolute: 320 cells/uL (ref 15–500)
Eosinophils Relative: 3.4 %
HCT: 40.7 % (ref 35.0–45.0)
Hemoglobin: 13.2 g/dL (ref 11.7–15.5)
Lymphs Abs: 2952 cells/uL (ref 850–3900)
MCH: 29.2 pg (ref 27.0–33.0)
MCHC: 32.4 g/dL (ref 32.0–36.0)
MCV: 90 fL (ref 80.0–100.0)
MPV: 10.1 fL (ref 7.5–12.5)
Monocytes Relative: 7.7 %
Neutro Abs: 5339 cells/uL (ref 1500–7800)
Neutrophils Relative %: 56.8 %
Platelets: 398 10*3/uL (ref 140–400)
RBC: 4.52 10*6/uL (ref 3.80–5.10)
RDW: 13.3 % (ref 11.0–15.0)
Total Lymphocyte: 31.4 %
WBC: 9.4 10*3/uL (ref 3.8–10.8)

## 2022-11-04 LAB — COMPLETE METABOLIC PANEL WITH GFR
ALT: 17 U/L (ref 6–29)
Albumin: 4.1 g/dL (ref 3.6–5.1)
Alkaline phosphatase (APISO): 101 U/L (ref 31–125)
Chloride: 100 mmol/L (ref 98–110)
Globulin: 3.1 g/dL (calc) (ref 1.9–3.7)
Glucose, Bld: 92 mg/dL (ref 65–99)
Sodium: 138 mmol/L (ref 135–146)
eGFR: 91 mL/min/{1.73_m2} (ref >=60)

## 2022-11-04 LAB — LIPID PANEL
Cholesterol: 223 mg/dL — ABNORMAL HIGH (ref <200)
LDL Cholesterol (Calc): 153 mg/dL (calc) — ABNORMAL HIGH
Total CHOL/HDL Ratio: 5 (calc) — ABNORMAL HIGH (ref <5.0)
Triglycerides: 125 mg/dL (ref <150)

## 2022-11-04 LAB — HIV-1 RNA QUANT-NO REFLEX-BLD
HIV 1 RNA Quant: NOT DETECTED Copies/mL
HIV-1 RNA Quant, Log: NOT DETECTED Log cps/mL

## 2022-11-04 LAB — RPR: RPR Ser Ql: NONREACTIVE

## 2022-11-14 ENCOUNTER — Telehealth (INDEPENDENT_AMBULATORY_CARE_PROVIDER_SITE_OTHER): Payer: No Typology Code available for payment source | Admitting: Internal Medicine

## 2022-11-14 ENCOUNTER — Encounter: Payer: Self-pay | Admitting: Internal Medicine

## 2022-11-14 ENCOUNTER — Other Ambulatory Visit: Payer: Self-pay

## 2022-11-14 DIAGNOSIS — B20 Human immunodeficiency virus [HIV] disease: Secondary | ICD-10-CM | POA: Diagnosis not present

## 2022-11-14 NOTE — Assessment & Plan Note (Signed)
[name] continues to do well on her salvage regimen and no changes indicated. Rtc in 6 months

## 2022-11-14 NOTE — Progress Notes (Signed)
   Subjective:    Patient ID: [name], female    DOB: 1975-04-24, 47 y.o.   MRN: [id number]  I connected with  EMMILYNN GARLINGER on 11/14/22 by a video enabled telemedicine application and verified that I am speaking with the correct person using two identifiers.   I discussed the limitations of evaluation and management by telemedicine. The patient expressed understanding and agreed to proceed.  Location: Patient - rehab Physician - clinic  Duration of visit: 12 minutes  HPI [name] is connected to  She continues on darunavir and Genvoya with no missed doses. Labs all reassuring.  Recently had a knee replacement and in rehab.     Review of Systems  Constitutional:  Negative for fatigue.  Gastrointestinal:  Negative for nausea.  Skin:  Negative for rash.       Objective:   Physical Exam Eyes:     General: No scleral icterus. Pulmonary:     Effort: Pulmonary effort is normal.  Neurological:     Mental Status: She is alert.   SH: no tobacco        Assessment & Plan:

## 2022-11-23 ENCOUNTER — Other Ambulatory Visit: Payer: Self-pay | Admitting: Internal Medicine

## 2022-11-23 DIAGNOSIS — B20 Human immunodeficiency virus [HIV] disease: Secondary | ICD-10-CM

## 2023-04-25 ENCOUNTER — Other Ambulatory Visit: Payer: Self-pay

## 2023-04-25 ENCOUNTER — Ambulatory Visit: Payer: No Typology Code available for payment source | Admitting: Internal Medicine

## 2023-04-25 DIAGNOSIS — B20 Human immunodeficiency virus [HIV] disease: Secondary | ICD-10-CM

## 2023-04-26 ENCOUNTER — Other Ambulatory Visit: Payer: No Typology Code available for payment source

## 2023-04-26 ENCOUNTER — Other Ambulatory Visit: Payer: Self-pay

## 2023-04-26 DIAGNOSIS — B20 Human immunodeficiency virus [HIV] disease: Secondary | ICD-10-CM

## 2023-04-29 LAB — T-HELPER CELLS (CD4) COUNT (NOT AT ARMC)
Absolute CD4: 500 {cells}/uL (ref 490–1740)
CD4 T Helper %: 31 % (ref 30–61)
Total lymphocyte count: 1597 {cells}/uL (ref 850–3900)

## 2023-04-29 LAB — HIV-1 RNA QUANT-NO REFLEX-BLD
HIV 1 RNA Quant: <20 {copies}/mL — ABNORMAL HIGH
HIV-1 RNA Quant, Log: <1.3 {Log} — ABNORMAL HIGH

## 2023-04-29 LAB — CBC WITH DIFFERENTIAL/PLATELET
Absolute Lymphocytes: 1753 {cells}/uL (ref 850–3900)
Absolute Monocytes: 650 {cells}/uL (ref 200–950)
Basophils Absolute: 71 {cells}/uL (ref 0–200)
Basophils Relative: 0.8 %
Eosinophils Absolute: 329 {cells}/uL (ref 15–500)
Eosinophils Relative: 3.7 %
HCT: 43.8 % (ref 35.0–45.0)
Hemoglobin: 14.4 g/dL (ref 11.7–15.5)
MCH: 30.4 pg (ref 27.0–33.0)
MCHC: 32.9 g/dL (ref 32.0–36.0)
MCV: 92.6 fL (ref 80.0–100.0)
MPV: 10.3 fL (ref 7.5–12.5)
Monocytes Relative: 7.3 %
Neutro Abs: 6097 {cells}/uL (ref 1500–7800)
Neutrophils Relative %: 68.5 %
Platelets: 326 10*3/uL (ref 140–400)
RBC: 4.73 10*6/uL (ref 3.80–5.10)
RDW: 12.9 % (ref 11.0–15.0)
Total Lymphocyte: 19.7 %
WBC: 8.9 10*3/uL (ref 3.8–10.8)

## 2023-04-29 LAB — COMPLETE METABOLIC PANEL WITH GFR
AG Ratio: 1.4 (calc) (ref 1.0–2.5)
ALT: 23 U/L (ref 6–29)
AST: 20 U/L (ref 10–35)
Albumin: 4 g/dL (ref 3.6–5.1)
Alkaline phosphatase (APISO): 96 U/L (ref 31–125)
BUN: 12 mg/dL (ref 7–25)
CO2: 28 mmol/L (ref 20–32)
Calcium: 9.3 mg/dL (ref 8.6–10.2)
Chloride: 103 mmol/L (ref 98–110)
Creat: 0.89 mg/dL (ref 0.50–0.99)
Globulin: 2.9 g/dL (ref 1.9–3.7)
Glucose, Bld: 105 mg/dL — ABNORMAL HIGH (ref 65–99)
Potassium: 4.5 mmol/L (ref 3.5–5.3)
Sodium: 140 mmol/L (ref 135–146)
Total Bilirubin: 0.6 mg/dL (ref 0.2–1.2)
Total Protein: 6.9 g/dL (ref 6.1–8.1)
eGFR: 81 mL/min/{1.73_m2} (ref >=60)

## 2023-04-29 LAB — HEMOGLOBIN A1C
Hgb A1c MFr Bld: 5.3 %{Hb} (ref <5.7)
Mean Plasma Glucose: 105 mg/dL
eAG (mmol/L): 5.8 mmol/L

## 2023-05-24 ENCOUNTER — Encounter: Payer: Self-pay | Admitting: Internal Medicine

## 2023-05-24 ENCOUNTER — Telehealth: Payer: No Typology Code available for payment source | Admitting: Internal Medicine

## 2023-05-24 ENCOUNTER — Other Ambulatory Visit: Payer: Self-pay

## 2023-05-24 DIAGNOSIS — B20 Human immunodeficiency virus [HIV] disease: Secondary | ICD-10-CM | POA: Diagnosis not present

## 2023-05-24 MED ORDER — GENVOYA 150-150-200-10 MG PO TABS
1.0000 | ORAL_TABLET | Freq: Every day | ORAL | 11 refills | Status: AC
Start: 2023-05-24 — End: ?

## 2023-05-24 MED ORDER — DARUNAVIR 800 MG PO TABS
800.0000 mg | ORAL_TABLET | Freq: Every day | ORAL | 11 refills | Status: AC
Start: 2023-05-24 — End: ?

## 2023-05-24 NOTE — Progress Notes (Signed)
   Subjective:    Patient ID: HARTLEIGH EDMONSTON, female    DOB: 08-02-1975, 48 y.o.   MRN: [id number]   I connected with  JAEDA BRUSO on 05/24/23 by a video enabled telemedicine application and verified that I am speaking with the correct person using two identifiers.   I discussed the limitations of evaluation and management by telemedicine. The patient expressed understanding and agreed to proceed.  Location: Patient - home Physician - clinic  Duration of visit:  15 minutes  HPI Keyira is being seen for follow-up of HIV She continues on her regimen of Genvoya  and Prezista  with no issues.  No missed doses and no concerns getting or taking her medication.  Her questions are regarding the change in the CD4 count and the less than 20 viral load compared to previous lab report of not detected.  No new health issues and did get her flu shot and sees her primary care physician for other screening considerations.   Review of Systems  Constitutional:  Negative for fatigue.       Objective:   Physical Exam Pulmonary:     Effort: Pulmonary effort is normal.  Neurological:     Mental Status: She is alert.           Assessment & Plan:

## 2023-06-20 ENCOUNTER — Telehealth: Payer: Self-pay

## 2023-06-20 NOTE — Telephone Encounter (Signed)
 Never say never, but this is should not have any connection to Prezista or Genvoya. There are no such reports with either. Her virus is also undetectable and her immune system remains within normal range.

## 2023-06-20 NOTE — Telephone Encounter (Signed)
 Patient called stating that she has lesion on the back of her head x 3 weeks. Patient stated that her hair stylist noticed it when cutting her hair. Patient went to [facility] yesterday, was told it looks like ring worm and was referred to dermatology. Patient concerned it could be due to [place]. Advised patient I would send a message back to provider and pharmacists but to follow up with dermatology.   Harvin Konicek Lesli Albee, CMA

## 2023-06-20 NOTE — Telephone Encounter (Signed)
Patient aware and voiced her understanding.

## 2023-07-24 ENCOUNTER — Ambulatory Visit (INDEPENDENT_AMBULATORY_CARE_PROVIDER_SITE_OTHER): Payer: No Typology Code available for payment source | Admitting: Otolaryngology

## 2023-07-24 ENCOUNTER — Encounter (INDEPENDENT_AMBULATORY_CARE_PROVIDER_SITE_OTHER): Payer: Self-pay

## 2023-07-24 VITALS — BP 126/81 | HR 83

## 2023-07-24 DIAGNOSIS — J328 Other chronic sinusitis: Secondary | ICD-10-CM | POA: Diagnosis not present

## 2023-07-24 DIAGNOSIS — J3489 Other specified disorders of nose and nasal sinuses: Secondary | ICD-10-CM | POA: Diagnosis not present

## 2023-07-24 DIAGNOSIS — J343 Hypertrophy of nasal turbinates: Secondary | ICD-10-CM

## 2023-07-24 DIAGNOSIS — R0981 Nasal congestion: Secondary | ICD-10-CM | POA: Diagnosis not present

## 2023-07-24 MED ORDER — AZELASTINE HCL 0.1 % NA SOLN: 2.0000 | Freq: Two times a day (BID) | NASAL | 12 refills | Status: AC

## 2023-07-24 NOTE — Patient Instructions (Signed)
 Use flonase two sprays each nostril twice per day; right after, use astelin spray two sprays each nostril twice per dya Lloyd Huger Med Nasal Saline Rinse  - use daily; buy at any drug store, over the counter - start nasal saline rinses with NeilMed Bottle available over the counter    Nasal Saline Irrigation instructions: If you choose to make your own salt water solution, You will need: Salt (kosher, canning, or pickling salt) Baking soda Nasal irrigation bottle (i.e. Lloyd Huger Med Sinus Rinse) Measuring spoon ( teaspoon) Distilled / boiled water   Mix solution Mix 1 teaspoon of salt, 1/2 teaspoon of baking soda and 1 cup of water into irrigation bottle ** May use saline packet instead of homemade recipe for this step if you prefer If medicine was prescribed to be mixed with solution, place this into bottle Examples 2 inches of 2% mupirocin ointment Budesonide solution Position your head: Lean over sink (about 45 degrees) Rotate head (about 45 degrees) so that one nostril is above the other Irrigate Insert tip of irrigation bottle into upper nostril so it forms a comfortable seal Irrigate while breathing through your mouth May remove the straw from the bottle in order to irrigate the entire solution (important if medicine was added) Exhale through nose when finished and blow nose as necessary  Repeat on opposite side with other 1/2 of solution (120 mL) or remake solution if all 240 mL was used on first side Wash irrigation bottle regularly, replace every 3 months

## 2023-07-24 NOTE — Progress Notes (Signed)
 Dear Dr. Alexia Angelucci, Here is my assessment for our mutual patient, [name]. Thank you for allowing me the opportunity to care for your patient. Please do not hesitate to contact me should you have any other questions. Sincerely, Dr. Milon Aloe  Otolaryngology Clinic Note  HISTORY: [name] is a 48 y.o. female kindly referred by Dr. Alexia Angelucci for evaluation of chronic nasal congestion.  Initial visit (07/2023): She reports that she has had chronic sinonasal issues for most of her life. She reports that she has "stayed in the ENT office" growing up. She has had multiple procedures - T&A, Turbinate Reduction, Septoplasty as well (early 2000s/decades ago), and UPPP. Surgeries may have helped some, but she feels like the right side is worse breathing wise than the left -- has always been the case despite the procedures. She feels like one side feels congested - not really obstructed. Sides do alternate. No real rhinorrhea or PND. Goes on year around but worse during allergy season (summer)  Sinusitis is not an issue for her -- she denies frequent episodes of sinus infections ("been a while") which necessitated use of abx/steroids. Maybe 1 sinus infection per year. Symptoms during an infection include discolored drainage, facial pressure (frontal, max). Antibiotics do help.  She has an upcoming sleep study for OSA  She uses flonase and zyrtec and it does help. Does not use anything else Has not tried saline rinses. She has used sudafed years ago. Has not tried astelin  or atrovent.  Allergy testing has not been done  GLP-1: no AP/AC: no  Tobacco: no  PMHx: Prior DVT/PE (no AP/AC), HIV  RADIOGRAPHIC EVALUATION AND INDEPENDENT REVIEW OF OTHER RECORDS:: Dr. Alexia Angelucci (FM) referral notes 05/25/2023 reviewed and uploaded or available in chart in media [id number] chronic sinus issues, on GERD, Reflux wakes up up at night, snoring, wakes up fatigued, nasal congestion; Dx; Nasal congestion, Rx: ref  to ENT Robie Cho PA Referral notes reviewed and uploaded or available in chart 07/12/2023: chronic nasal congestion, OSA, daytime sleepiness; chronic nasal congestion, ref to ENT Labs reviewed: CMP 03/19/2023: BUN/Cr 9/0.8; TSH 1.48 CT Neck 02/21/2018 independently interpreted with respect to sinuses: limited exam but septum relatively midline, dental roots within sinus - query odontogenic cause of b/l max MPT; unable to visualize ethmoids or frontals comprehensively; frontals without significant disease b/l  Past Medical History:  Diagnosis Date   Anxiety    Arthritis    Candidal esophagitis (HCC)    Complication of anesthesia 07/2011   in PACU took long time for oxygen sts to come up,2008 tonsillectomy went to ICU   Deep vein blood clot of right lower extremity (HCC)    Family history of adverse reaction to anesthesia    mother H/O n/v   Fracture of right ankle, lateral malleolus    HIV (human immunodeficiency virus infection) (HCC)    Hypertension    MAC (mycobacterium avium-intracellulare complex)    positive culture, treated for 3 months   Pulmonary embolism [facility])    Past Surgical History:  Procedure Laterality Date   ABDOMINAL HYSTERECTOMY  03/18/2019   ABDOMINAL HYSTERECTOMY N/A 03/18/2019   Procedure: HYSTERECTOMY ABDOMINAL WITH BILATERAL SALPINGECTOMY;  Surgeon: Matt Song, MD;  Location: [facility] OR;  Service: Gynecology;  Laterality: N/A;   ANKLE ARTHROSCOPY WITH OPEN REDUCTION INTERNAL FIXATION (ORIF) Right 10/31/2017   ANTERIOR CRUCIATE LIGAMENT REPAIR     Right and left repair   bilateral rotator cuff repari      LYMPH NODE BIOPSY Left 11/14/2016  Procedure: Left Cervical node I/D (incision and drain) and Biopsy;  Surgeon: Norita Beauvais, MD;  Location: [facility] OR;  Service: Thoracic;  Laterality: Left;   NASAL RECONSTRUCTION  1982   ORIF ANKLE FRACTURE Right 11/01/2017   Procedure: OPEN REDUCTION INTERNAL FIXATION (ORIF) right lateral malleolus fracture;   Surgeon: Amada Backer, MD;  Location: [facility];  Service: Orthopedics;  Laterality: Right;   SUPRACLAVICAL NODE BIOPSY  07/28/2011   Procedure: SUPRACLAVICAL NODE BIOPSY;  Surgeon: Gwendel Lemme, MD;  Location: [facility] OR;  Service: Thoracic;  Laterality: Left;   TONSILLECTOMY     TOTAL KNEE ARTHROPLASTY Right 10/10/2022   Procedure: RIGHT TOTAL KNEE ARTHROPLASTY;  Surgeon: Dayne Even, MD;  Location: [facility] ORS;  Service: Orthopedics;  Laterality: Right;   Family History  Problem Relation Age of Onset   Anesthesia problems Neg Hx    Hypotension Neg Hx    Malignant hyperthermia Neg Hx    Pseudochol deficiency Neg Hx    Social History   Tobacco Use   Smoking status: Never   Smokeless tobacco: Never  Substance Use Topics   Alcohol use: Yes    Comment: socially   No Known Allergies Current Outpatient Medications  Medication Sig Dispense Refill   ALPRAZolam  (XANAX ) 0.25 MG tablet Take 0.25 mg by mouth daily as needed for anxiety.   0   azelastine  (ASTELIN ) 0.1 % nasal spray Place 2 sprays into both nostrils 2 (two) times daily. Use in each nostril as directed 30 mL 12   cetirizine (ZYRTEC) 10 MG tablet Take 10 mg by mouth at bedtime as needed for allergies.     clotrimazole-betamethasone (LOTRISONE) cream Apply 1 Application topically 2 (two) times daily as needed (rash).     darunavir  (PREZISTA ) 800 MG tablet Take 1 tablet (800 mg total) by mouth daily. 30 tablet 11   elvitegravir-cobicistat-emtricitabine -tenofovir  (GENVOYA ) 150-150-200-10 MG TABS tablet Take 1 tablet by mouth daily. 30 tablet 11   hydrochlorothiazide  (MICROZIDE ) 12.5 MG capsule Take 1 capsule (12.5 mg total) by mouth daily. 30 capsule 0   Phenylephrine -Acetaminophen  (TYLENOL  SINUS+HEADACHE PO) Take 2 tablets by mouth daily as needed (congestion).     cephALEXin (KEFLEX) 500 MG capsule Take 500 mg by mouth 2 (two) times daily. (Patient not taking: Reported on 07/24/2023)     enoxaparin  (LOVENOX ) 30 MG/0.3ML  injection Inject 0.3 mLs (30 mg total) into the skin every 12 (twelve) hours. (Patient not taking: Reported on 07/24/2023) 18 mL 0   HYDROcodone -acetaminophen  (NORCO/VICODIN) 5-325 MG tablet Take 1-2 tablets by mouth every 6 (six) hours as needed for moderate pain or severe pain (post op pain). (Patient not taking: Reported on 07/24/2023) 40 tablet 0   tiZANidine  (ZANAFLEX ) 4 MG tablet Take 1 tablet (4 mg total) by mouth every 6 (six) hours as needed for muscle spasms. (Patient not taking: Reported on 07/24/2023) 40 tablet 1   No current facility-administered medications for this visit.   BP 126/81 (BP Location: Right Arm, Patient Position: Sitting, Cuff Size: Large)   Pulse 83   LMP 02/28/2019 (Approximate)   SpO2 97%   PHYSICAL EXAM:  BP 126/81 (BP Location: Right Arm, Patient Position: Sitting, Cuff Size: Large)   Pulse 83   LMP 02/28/2019 (Approximate)   SpO2 97%    Salient findings:  CN II-XII intact Bilateral EAC clear and TM intact with well pneumatized middle ear spaces Nose: Anterior rhinoscopy reveals septum relatively midline, turbinates appear reduced but mild hypertrophy.  Nasal endoscopy was indicated to  better evaluate the nose and paranasal sinuses, given the patient's history and exam findings, and is detailed below. No lesions of oral cavity/oropharynx; dentition relatively poor; evidence of prior UPPP; modified cottle negative, modest tip support No obviously palpable neck masses/lymphadenopathy/thyromegaly No respiratory distress or stridor   PROCEDURE: Diagnostic Nasal Endoscopy Pre-procedure diagnosis: Nasal congestion, concern for structural cause Post-procedure diagnosis: same Indication: See pre-procedure diagnosis and physical exam above Complications: None apparent EBL: 0 mL Anesthesia: Lidocaine  4% and topical decongestant was topically sprayed in each nasal cavity  Description of Procedure:  Patient was identified. A rigid 30 degree endoscope was utilized to  evaluate the sinonasal cavities, mucosa, sinus ostia and turbinates and septum.  Overall, signs of mucosal inflammation are not noted.  No mucopurulence, polyps, or masses noted.   Right Middle meatus: clear Right SE Recess: clear Left MM: clear Left SE Recess: clear  CPT CODE -- 31231 - Mod 25   ASSESSMENT:  48 y.o. with:  1. Nasal congestion   2. Nasal obstruction   3. Hypertrophy of both inferior nasal turbinates   4. Other chronic sinusitis    Primary symptom is nasal congestion, who has undergone multiple prior procedures including septoplasty, turbinate reduction, and UPPP.  No other significant symptoms including CRS symptoms requiring abx/steroids or discolored drainage, significant facial pressure. Modified cottle is negative  PLAN: We've discussed issues and options today.  We reviewed the nasal endoscopy images together.  The risks, benefits and alternatives were discussed and questions answered.  She has elected to proceed with medical management first.  - Trial Breathe right strips - Use astelin  two sprays each nostril BID - Continue BID flonase - Start Daily rinses - f/u 4-6 months; consider NV repair v/s revision turbinate reduction - Not having CRS cardinal symptoms so will hold off on abx/steroids despite CT findings - Agree with sleep management for fatigue and possible OSA  See below regarding exact medications prescribed this encounter including dosages and route: Meds ordered this encounter  Medications   azelastine  (ASTELIN ) 0.1 % nasal spray    Sig: Place 2 sprays into both nostrils 2 (two) times daily. Use in each nostril as directed    Dispense:  30 mL    Refill:  12     Thank you for allowing me the opportunity to care for your patient. Please do not hesitate to contact me should you have any other questions.  Sincerely, Milon Aloe, MD Otolaryngologist (ENT), [facility] Phone: [phone] Fax: [phone]  MDM:  Level 4:  [phone] Complexity/Problems addressed: mod Data complexity: mod - independent interpretation of CT imaging; review of notes and labs - Morbidity: mod  - Prescription Drug prescribed or managed: yes  08/06/2023, 1:01 PM

## 2023-09-03 NOTE — Progress Notes (Signed)
 The 10-year ASCVD risk score (Arnett DK, et al., 2019) is: 3.9%   Values used to calculate the score:     Age: 48 years     Sex: Female     Is Non-Hispanic African American: Yes     Diabetic: No     Tobacco smoker: No     Systolic Blood Pressure: 126 mmHg     Is BP treated: Yes     HDL Cholesterol: 45 mg/dL     Total Cholesterol: 223 mg/dL  Arlon Bergamo, BSN, RN

## 2023-09-06 ENCOUNTER — Other Ambulatory Visit: Payer: Self-pay | Admitting: Orthopaedic Surgery

## 2023-09-07 ENCOUNTER — Telehealth: Payer: Self-pay | Admitting: Pulmonary Disease

## 2023-09-07 NOTE — Telephone Encounter (Signed)
 Fax received from Dr. Dalldorf with [facility] to perform a left total knee arthroplasty on patient.  Patient needs surgery clearance. Surgery is 10/23/23. Patient was seen on 4/;18/25. Office protocol is a risk assessment can be sent to surgeon if patient has been seen in 60 days or less.   Sending to Dr Gaynell Keeler for risk assessment or recommendations if patient needs to be seen in office prior to surgical procedure.   Dr Deanna Expose- the last note mentioned clearance for colonoscopy- can you add assessment for total knee?

## 2023-09-11 NOTE — Telephone Encounter (Signed)
 Appt is scheduled for 09/27/23 with Dr Gaynell Keeler

## 2023-09-11 NOTE — Telephone Encounter (Signed)
 Patient was last seen 08/03/2022  Needs seen in the office. Can be by myself or one of the APP's

## 2023-09-27 ENCOUNTER — Ambulatory Visit (INDEPENDENT_AMBULATORY_CARE_PROVIDER_SITE_OTHER): Admitting: Pulmonary Disease

## 2023-09-27 ENCOUNTER — Encounter: Payer: Self-pay | Admitting: Pulmonary Disease

## 2023-09-27 VITALS — BP 131/82 | HR 68 | Ht 69.0 in | Wt 270.2 lb

## 2023-09-27 DIAGNOSIS — I2699 Other pulmonary embolism without acute cor pulmonale: Secondary | ICD-10-CM

## 2023-09-27 NOTE — Patient Instructions (Signed)
 You are cleared for surgery from a breathing perspective  As long as you are not having any acutely reversible problems prior to surgery, I think you will do great with surgery  Will have notes in the record that states that you are cleared for surgery  Call as needed  I will see you just as needed

## 2023-09-27 NOTE — Progress Notes (Signed)
 [name]    [id number]    [birth year]  Primary Care Physician:Wolters, Genevia Kern, MD  Referring Physician: Olin Bertin, MD [address] [place],  [place] [address]  Chief complaint:   In for preop eval  HPI:  She has been doing well with no significant concerns at present She is having left knee replacement  She had had a right knee replacement previously and tolerated it well  Past history of DVT/PE treated for 3 months in 2019 DVT was provoked by surgery  Has been doing well history of obstructive sleep apnea - Managed surgically - Follow-up study was negative for significant sleep disordered breathing  She has no acutely reversible problems at present  Not limited with activities of daily living No history of asthma, never smoker no significant occupational or predisposition  On antiretrovirals and compliant  Outpatient Encounter Medications as of 09/27/2023  Medication Sig   azelastine  (ASTELIN ) 0.1 % nasal spray Place 2 sprays into both nostrils 2 (two) times daily. Use in each nostril as directed   cetirizine (ZYRTEC) 10 MG tablet Take 10 mg by mouth at bedtime as needed for allergies.   clotrimazole-betamethasone (LOTRISONE) cream Apply 1 Application topically 2 (two) times daily as needed (rash).   darunavir  (PREZISTA ) 800 MG tablet Take 1 tablet (800 mg total) by mouth daily.   elvitegravir-cobicistat-emtricitabine -tenofovir  (GENVOYA ) 150-150-200-10 MG TABS tablet Take 1 tablet by mouth daily.   ALPRAZolam  (XANAX ) 0.25 MG tablet Take 0.25 mg by mouth daily as needed for anxiety.  (Patient not taking: Reported on 09/27/2023)   cephALEXin (KEFLEX) 500 MG capsule Take 500 mg by mouth 2 (two) times daily. (Patient not taking: Reported on 09/27/2023)   enoxaparin  (LOVENOX ) 30 MG/0.3ML injection Inject 0.3 mLs (30 mg total) into the skin every 12 (twelve) hours. (Patient not taking: Reported on 07/24/2023)   hydrochlorothiazide  (MICROZIDE ) 12.5 MG  capsule Take 1 capsule (12.5 mg total) by mouth daily. (Patient not taking: Reported on 09/27/2023)   HYDROcodone -acetaminophen  (NORCO/VICODIN) 5-325 MG tablet Take 1-2 tablets by mouth every 6 (six) hours as needed for moderate pain or severe pain (post op pain). (Patient not taking: Reported on 07/24/2023)   Phenylephrine -Acetaminophen  (TYLENOL  SINUS+HEADACHE PO) Take 2 tablets by mouth daily as needed (congestion). (Patient not taking: Reported on 09/27/2023)   tiZANidine  (ZANAFLEX ) 4 MG tablet Take 1 tablet (4 mg total) by mouth every 6 (six) hours as needed for muscle spasms. (Patient not taking: Reported on 07/24/2023)   No facility-administered encounter medications on file as of 09/27/2023.    Allergies as of 09/27/2023   (No Known Allergies)    Past Medical History:  Diagnosis Date   Anxiety    Arthritis    Candidal esophagitis (HCC)    Complication of anesthesia 07/2011   in PACU took long time for oxygen sts to come up,2008 tonsillectomy went to ICU   Deep vein blood clot of right lower extremity (HCC)    Family history of adverse reaction to anesthesia    mother H/O n/v   Fracture of right ankle, lateral malleolus    HIV (human immunodeficiency virus infection) (HCC)    Hypertension    MAC (mycobacterium avium-intracellulare complex)    positive culture, treated for 3 months   Pulmonary embolism [facility])     Past Surgical History:  Procedure Laterality Date   ABDOMINAL HYSTERECTOMY  03/18/2019   ABDOMINAL HYSTERECTOMY N/A 03/18/2019   Procedure: HYSTERECTOMY ABDOMINAL WITH BILATERAL SALPINGECTOMY;  Surgeon: Matt Song, MD;  Location: [facility] OR;  Service: Gynecology;  Laterality: N/A;   ANKLE ARTHROSCOPY WITH OPEN REDUCTION INTERNAL FIXATION (ORIF) Right 10/31/2017   ANTERIOR CRUCIATE LIGAMENT REPAIR     Right and left repair   bilateral rotator cuff repari      LYMPH NODE BIOPSY Left 11/14/2016   Procedure: Left Cervical node I/D (incision and drain) and Biopsy;  Surgeon:  Norita Beauvais, MD;  Location: [facility] OR;  Service: Thoracic;  Laterality: Left;   NASAL RECONSTRUCTION  1982   ORIF ANKLE FRACTURE Right 11/01/2017   Procedure: OPEN REDUCTION INTERNAL FIXATION (ORIF) right lateral malleolus fracture;  Surgeon: Amada Backer, MD;  Location: [facility];  Service: Orthopedics;  Laterality: Right;   SUPRACLAVICAL NODE BIOPSY  07/28/2011   Procedure: SUPRACLAVICAL NODE BIOPSY;  Surgeon: Gwendel Lemme, MD;  Location: [facility] OR;  Service: Thoracic;  Laterality: Left;   TONSILLECTOMY     TOTAL KNEE ARTHROPLASTY Right 10/10/2022   Procedure: RIGHT TOTAL KNEE ARTHROPLASTY;  Surgeon: Dayne Even, MD;  Location: [facility];  Service: Orthopedics;  Laterality: Right;    Family History  Problem Relation Age of Onset   Anesthesia problems Neg Hx    Hypotension Neg Hx    Malignant hyperthermia Neg Hx    Pseudochol deficiency Neg Hx     Social History   Socioeconomic History   Marital status: Single    Spouse name: Not on file   Number of children: Not on file   Years of education: Not on file   Highest education level: Not on file  Occupational History   Not on file  Tobacco Use   Smoking status: Never   Smokeless tobacco: Never  Vaping Use   Vaping status: Never Used  Substance and Sexual Activity   Alcohol use: Yes    Comment: socially   Drug use: No   Sexual activity: Not Currently    Partners: Female    Birth control/protection: None    Comment: declined condoms  Other Topics Concern   Not on file  Social History Narrative   Not on file   Social Drivers of Health   Financial Resource Strain: Not on file  Food Insecurity: No Food Insecurity (10/10/2022)   Hunger Vital Sign    Worried About Running Out of Food in the Last Year: Never true    Ran Out of Food in the Last Year: Never true  Transportation Needs: No Transportation Needs (10/10/2022)   PRAPARE - Administrator, Civil Service (Medical): No    Lack of  Transportation (Non-Medical): No  Physical Activity: Not on file  Stress: Not on file  Social Connections: Unknown (08/29/2021)   [facility] from [facility]   Social Network    Social Network: Not on file  Intimate Partner Violence: Not At Risk (10/10/2022)   Humiliation, Afraid, Rape, and Kick questionnaire    Fear of Current or Ex-Partner: No    Emotionally Abused: No    Physically Abused: No    Sexually Abused: No    Review of Systems  Constitutional:  Negative for fatigue.  Respiratory:  Negative for shortness of breath.     Vitals:   09/27/23 1037  BP: 131/82  Pulse: 68  SpO2: 96%     Physical Exam Constitutional:      Appearance: She is obese.  HENT:     Head: Normocephalic.     Mouth/Throat:     Mouth: Mucous membranes are  moist.   Eyes:     General: No scleral icterus.   Cardiovascular:     Rate and Rhythm: Normal rate and regular rhythm.     Heart sounds: No murmur heard.    No friction rub.  Pulmonary:     Effort: No respiratory distress.     Breath sounds: No stridor. No wheezing, rhonchi or rales.   Musculoskeletal:     Cervical back: No rigidity or tenderness.   Neurological:     Mental Status: She is alert.   Psychiatric:        Mood and Affect: Mood normal.      Data Reviewed: CT scan from 08/29/2018 was reviewed by myself showing no evidence of pulmonary embolism  CT scan from 11/10/2017 reviewed showing bilateral pulmonary embolism  Assessment:  Preop eval for knee replacement  She is not having any significant problems at the present time  She did have the other knee replaced and tolerated surgery well  History of obstructive sleep apnea that was treated surgically - Follow-up sleep study was negative for significant findings  Treated for pulmonary embolism in 2019 was on anticoagulation for 3 months - Has been stable   Plan/Recommendations: No preclusion to having knee surgery  Risk for complication is low based on  ARISCAT scoring  Not having any respiratory complaints at present  Follow-up as needed  Call us  with significant concerns    Myer Artis MD [facility] 09/27/2023, 10:39 AM  CC: Olin Bertin, MD

## 2023-10-09 NOTE — Patient Instructions (Signed)
 SURGICAL WAITING ROOM VISITATION  Patients having surgery or a procedure may have no more than 2 support people in the waiting area - these visitors may rotate.    Children under the age of 71 must have an adult with them who is not the patient.  Visitors with respiratory illnesses are discouraged from visiting and should remain at home.  If the patient needs to stay at the hospital during part of their recovery, the visitor guidelines for inpatient rooms apply. Pre-op nurse will coordinate an appropriate time for 1 support person to accompany patient in pre-op.  This support person may not rotate.    Please refer to the [facility] website for the visitor guidelines for Inpatients (after your surgery is over and you are in a regular room).       Your procedure is scheduled on: 10/23/23   Report to [facility] Main Entrance    Report to admitting at: 5:15 AM   Call this number if you have problems the morning of surgery [phone]   Do not eat food :After Midnight.   After Midnight you may have the following liquids until : 4:30 AM DAY OF SURGERY  Water  Non-Citrus Juices (without pulp, NO RED-Apple, White grape, White cranberry) Black Coffee (NO MILK/CREAM OR CREAMERS, sugar ok)  Clear Tea (NO MILK/CREAM OR CREAMERS, sugar ok) regular and decaf                             Plain Jell-O (NO RED)                                           Fruit ices (not with fruit pulp, NO RED)                                     Popsicles (NO RED)                                                               Sports drinks like Gatorade (NO RED)   The day of surgery:  Drink ONE (1) Pre-Surgery Clear Ensure at: 4:30 AM the morning of surgery. Drink in one sitting. Do not sip.  This drink was given to you during your hospital  pre-op appointment visit. Nothing else to drink after completing the  Pre-Surgery Clear Ensure or G2.          If you have questions, please contact your surgeon's  office.  FOLLOW ANY ADDITIONAL PRE OP INSTRUCTIONS YOU RECEIVED FROM YOUR SURGEON'S OFFICE!!!   Oral Hygiene is also important to reduce your risk of infection.                                    Remember - BRUSH YOUR TEETH THE MORNING OF SURGERY WITH YOUR REGULAR TOOTHPASTE  DENTURES WILL BE REMOVED PRIOR TO SURGERY PLEASE DO NOT APPLY Poly grip OR ADHESIVES!!!   Do NOT smoke after Midnight   Stop all vitamins and  herbal supplements 7 days before surgery.   Take these medicines the morning of surgery with A SIP OF WATER : darunavir ,elvitegravir.Cetirizine as                              You may not have any metal on your body including hair pins, jewelry, and body piercing             Do not wear make-up, lotions, powders, perfumes/cologne, or deodorant  Do not wear nail polish including gel and S&S, artificial/acrylic nails, or any other type of covering on natural nails including finger and toenails. If you have artificial nails, gel coating, etc. that needs to be removed by a nail salon please have this removed prior to surgery or surgery may need to be canceled/ delayed if the surgeon/ anesthesia feels like they are unable to be safely monitored.   Do not shave  48 hours prior to surgery.    Do not bring valuables to the hospital. [facility] IS NOT             RESPONSIBLE   FOR VALUABLES.   Contacts, glasses, dentures or bridgework may not be worn into surgery.   Bring small overnight bag day of surgery.   DO NOT BRING YOUR HOME MEDICATIONS TO THE HOSPITAL. PHARMACY WILL DISPENSE MEDICATIONS LISTED ON YOUR MEDICATION LIST TO YOU DURING YOUR ADMISSION IN THE HOSPITAL!    Patients discharged on the day of surgery will not be allowed to drive home.  Someone NEEDS to stay with you for the first 24 hours after anesthesia.   Special Instructions: Bring a copy of your healthcare power of attorney and living will documents the day of surgery if you haven't scanned them before.               Please read over the following fact sheets you were given: IF YOU HAVE QUESTIONS ABOUT YOUR PRE-OP INSTRUCTIONS PLEASE CALL [phone]   If you received a COVID test during your pre-op visit  it is requested that you wear a mask when out in public, stay away from anyone that may not be feeling well and notify your surgeon if you develop symptoms. If you test positive for Covid or have been in contact with anyone that has tested positive in the last 10 days please notify you surgeon.      Pre-operative 5 CHG Bath Instructions   You can play a key role in reducing the risk of infection after surgery. Your skin needs to be as free of germs as possible. You can reduce the number of germs on your skin by washing with CHG (chlorhexidine  gluconate) soap before surgery. CHG is an antiseptic soap that kills germs and continues to kill germs even after washing.   DO NOT use if you have an allergy to chlorhexidine /CHG or antibacterial soaps. If your skin becomes reddened or irritated, stop using the CHG and notify one of our RNs at [phone].   Please shower with the CHG soap starting 4 days before surgery using the following schedule:     Please keep in mind the following:  DO NOT shave, including legs and underarms, starting the day of your first shower.   You may shave your face at any point before/day of surgery.  Place clean sheets on your bed the day you start using CHG soap. Use a clean washcloth (not used since being washed) for each shower. DO  NOT sleep with pets once you start using the CHG.   CHG Shower Instructions:  If you choose to wash your hair and private area, wash first with your normal shampoo/soap.  After you use shampoo/soap, rinse your hair and body thoroughly to remove shampoo/soap residue.  Turn the water  OFF and apply about 3 tablespoons (45 ml) of CHG soap to a CLEAN washcloth.  Apply CHG soap ONLY FROM YOUR NECK DOWN TO YOUR TOES (washing for 3-5 minutes)   DO NOT use CHG soap on face, private areas, open wounds, or sores.  Pay special attention to the area where your surgery is being performed.  If you are having back surgery, having someone wash your back for you may be helpful. Wait 2 minutes after CHG soap is applied, then you may rinse off the CHG soap.  Pat dry with a clean towel  Put on clean clothes/pajamas   If you choose to wear lotion, please use ONLY the CHG-compatible lotions on the back of this paper.     Additional instructions for the day of surgery: DO NOT APPLY any lotions, deodorants, cologne, or perfumes.   Put on clean/comfortable clothes.  Brush your teeth.  Ask your nurse before applying any prescription medications to the skin.   CHG Compatible Lotions   Aveeno Moisturizing lotion  Cetaphil Moisturizing Cream  Cetaphil Moisturizing Lotion  Clairol Herbal Essence Moisturizing Lotion, Dry Skin  Clairol Herbal Essence Moisturizing Lotion, Extra Dry Skin  Clairol Herbal Essence Moisturizing Lotion, Normal Skin  Curel Age Defying Therapeutic Moisturizing Lotion with Alpha Hydroxy  Curel Extreme Care Body Lotion  Curel Soothing Hands Moisturizing Hand Lotion  Curel Therapeutic Moisturizing Cream, Fragrance-Free  Curel Therapeutic Moisturizing Lotion, Fragrance-Free  Curel Therapeutic Moisturizing Lotion, Original Formula  Eucerin Daily Replenishing Lotion  Eucerin Dry Skin Therapy Plus Alpha Hydroxy Crme  Eucerin Dry Skin Therapy Plus Alpha Hydroxy Lotion  Eucerin Original Crme  Eucerin Original Lotion  Eucerin Plus Crme Eucerin Plus Lotion  Eucerin TriLipid Replenishing Lotion  Keri Anti-Bacterial Hand Lotion  Keri Deep Conditioning Original Lotion Dry Skin Formula Softly Scented  Keri Deep Conditioning Original Lotion, Fragrance Free Sensitive Skin Formula  Keri Lotion Fast Absorbing Fragrance Free Sensitive Skin Formula  Keri Lotion Fast Absorbing Softly Scented Dry Skin Formula  Keri Original Lotion   Keri Skin Renewal Lotion Keri Silky Smooth Lotion  Keri Silky Smooth Sensitive Skin Lotion  Nivea Body Creamy Conditioning Oil  Nivea Body Extra Enriched Lotion  Nivea Body Original Lotion  Nivea Body Sheer Moisturizing Lotion Nivea Crme  Nivea Skin Firming Lotion  NutraDerm 30 Skin Lotion  NutraDerm Skin Lotion  NutraDerm Therapeutic Skin Cream  NutraDerm Therapeutic Skin Lotion  ProShield Protective Hand Cream  Provon moisturizing lotion   Incentive Spirometer  An incentive spirometer is a tool that can help keep your lungs clear and active. This tool measures how well you are filling your lungs with each breath. Taking long deep breaths may help reverse or decrease the chance of developing breathing (pulmonary) problems (especially infection) following: A long period of time when you are unable to move or be active. BEFORE THE PROCEDURE  If the spirometer includes an indicator to show your best effort, your nurse or respiratory therapist will set it to a desired goal. If possible, sit up straight or lean slightly forward. Try not to slouch. Hold the incentive spirometer in an upright position. INSTRUCTIONS FOR USE  Sit on the edge of your bed if possible, or sit  up as far as you can in bed or on a chair. Hold the incentive spirometer in an upright position. Breathe out normally. Place the mouthpiece in your mouth and seal your lips tightly around it. Breathe in slowly and as deeply as possible, raising the piston or the ball toward the top of the column. Hold your breath for 3-5 seconds or for as long as possible. Allow the piston or ball to fall to the bottom of the column. Remove the mouthpiece from your mouth and breathe out normally. Rest for a few seconds and repeat Steps 1 through 7 at least 10 times every 1-2 hours when you are awake. Take your time and take a few normal breaths between deep breaths. The spirometer may include an indicator to show your best effort. Use the  indicator as a goal to work toward during each repetition. After each set of 10 deep breaths, practice coughing to be sure your lungs are clear. If you have an incision (the cut made at the time of surgery), support your incision when coughing by placing a pillow or rolled up towels firmly against it. Once you are able to get out of bed, walk around indoors and cough well. You may stop using the incentive spirometer when instructed by your caregiver.  RISKS AND COMPLICATIONS Take your time so you do not get dizzy or light-headed. If you are in pain, you may need to take or ask for pain medication before doing incentive spirometry. It is harder to take a deep breath if you are having pain. AFTER USE Rest and breathe slowly and easily. It can be helpful to keep track of a log of your progress. Your caregiver can provide you with a simple table to help with this. If you are using the spirometer at home, follow these instructions: SEEK MEDICAL CARE IF:  You are having difficultly using the spirometer. You have trouble using the spirometer as often as instructed. Your pain medication is not giving enough relief while using the spirometer. You develop fever of 100.5 F (38.1 C) or higher. SEEK IMMEDIATE MEDICAL CARE IF:  You cough up bloody sputum that had not been present before. You develop fever of 102 F (38.9 C) or greater. You develop worsening pain at or near the incision site. MAKE SURE YOU:  Understand these instructions. Will watch your condition. Will get help right away if you are not doing well or get worse. Document Released: 08/14/2006 Document Revised: 06/26/2011 Document Reviewed: 10/15/2006 [facility] Patient Information 2014 [place], [place].   ________________________________________________________________________

## 2023-10-10 ENCOUNTER — Other Ambulatory Visit: Payer: Self-pay

## 2023-10-10 ENCOUNTER — Encounter (HOSPITAL_COMMUNITY)
Admission: RE | Admit: 2023-10-10 | Discharge: 2023-10-10 | Disposition: A | Discharge status: Home / Self Care | Source: Ambulatory Visit | Attending: Orthopaedic Surgery | Admitting: Orthopaedic Surgery

## 2023-10-10 ENCOUNTER — Encounter (HOSPITAL_COMMUNITY): Payer: Self-pay

## 2023-10-10 ENCOUNTER — Ambulatory Visit (HOSPITAL_COMMUNITY)
Admission: RE | Admit: 2023-10-10 | Discharge: 2023-10-10 | Disposition: A | Discharge status: Home / Self Care | Source: Ambulatory Visit | Attending: Orthopaedic Surgery | Admitting: Orthopaedic Surgery

## 2023-10-10 VITALS — BP 133/81 | HR 70 | Temp 98.3°F | Ht 70.0 in | Wt 260.0 lb

## 2023-10-10 DIAGNOSIS — I2699 Other pulmonary embolism without acute cor pulmonale: Secondary | ICD-10-CM

## 2023-10-10 DIAGNOSIS — Z01818 Encounter for other preprocedural examination: Secondary | ICD-10-CM | POA: Insufficient documentation

## 2023-10-10 LAB — CBC
HCT: 41.7 % (ref 36.0–46.0)
Hemoglobin: 13.8 g/dL (ref 12.0–15.0)
MCH: 30.4 pg (ref 26.0–34.0)
MCHC: 33.1 g/dL (ref 30.0–36.0)
MCV: 91.9 fL (ref 80.0–100.0)
Platelets: 282 10*3/uL (ref 150–400)
RBC: 4.54 MIL/uL (ref 3.87–5.11)
RDW: 13.2 % (ref 11.5–15.5)
WBC: 7 10*3/uL (ref 4.0–10.5)
nRBC: 0 % (ref 0.0–0.2)

## 2023-10-10 LAB — BASIC METABOLIC PANEL WITH GFR
Anion gap: 10 (ref 5–15)
BUN: 17 mg/dL (ref 6–20)
CO2: 26 mmol/L (ref 22–32)
Calcium: 8.9 mg/dL (ref 8.9–10.3)
Chloride: 104 mmol/L (ref 98–111)
Creatinine, Ser: 0.71 mg/dL (ref 0.44–1.00)
GFR, Estimated: >60 mL/min (ref >60)
Glucose, Bld: 92 mg/dL (ref 70–99)
Potassium: 3.7 mmol/L (ref 3.5–5.1)
Sodium: 140 mmol/L (ref 135–145)

## 2023-10-10 LAB — SURGICAL PCR SCREEN
MRSA, PCR: NEGATIVE
Staphylococcus aureus: NEGATIVE

## 2023-10-10 NOTE — Progress Notes (Signed)
 For Anesthesia: PCP - Dr. Reena Duck  Cardiologist - N/A Pulmonologist: Neda Jennet LABOR, MD  Bowel Prep reminder:  Chest x-ray - 10/05/22 EKG - 10/10/23 Stress Test -  ECHO - 03/04/18 Cardiac Cath -  Pacemaker/ICD device last checked: Pacemaker orders received: Device Rep notified:  Spinal Cord Stimulator:N/A  Sleep Study - Yes CPAP - No  Fasting Blood Sugar - N/A Checks Blood Sugar _____ times a day Date and result of last Hgb A1c-  Last dose of GLP1 agonist- N/A GLP1 instructions:   Last dose of SGLT-2 inhibitors- N/A SGLT-2 instructions:   Blood Thinner Instructions:N/A Aspirin Instructions: Last Dose:  Activity level: Can go up a flight of stairs and activities of daily living without stopping and without chest pain and/or shortness of breath   Able to exercise without chest pain and/or shortness of breath  Anesthesia review: Hx: PE,HIV,HTN.  Patient denies shortness of breath, fever, cough and chest pain at PAT appointment   Patient verbalized understanding of instructions that were reviewed over the telephone.

## 2023-10-16 NOTE — H&P (Signed)
 TOTAL KNEE ADMISSION H&P  Patient is being admitted for left total knee arthroplasty.  Subjective:  Chief Complaint:left knee pain.  HPI: [name], 48 y.o. female, has a history of pain and functional disability in the left knee due to arthritis and has failed non-surgical conservative treatments for greater than 12 weeks to includeNSAID's and/or analgesics, corticosteriod injections, viscosupplementation injections, flexibility and strengthening excercises, supervised PT with diminished ADL's post treatment, use of assistive devices, weight reduction as appropriate, and activity modification.  Onset of symptoms was gradual, starting 5 years ago with gradually worsening course since that time. The patient noted no past surgery on the left knee(s).  Patient currently rates pain in the left knee(s) at 10 out of 10 with activity. Patient has night pain, worsening of pain with activity and weight bearing, pain that interferes with activities of daily living, crepitus, and joint swelling.  Patient has evidence of subchondral cysts, subchondral sclerosis, periarticular osteophytes, and joint space narrowing by imaging studies. There is no active infection.  Patient Active Problem List   Diagnosis Date Noted   Primary osteoarthritis of right knee 10/10/2022   Primary localized osteoarthritis of right knee 10/10/2022   Postoperative state 03/18/2019   Medication monitoring encounter 03/25/2018   Right leg DVT (HCC) 11/11/2017   Bilateral pulmonary embolism (HCC) 11/10/2017   Granulomatous adenopathy 12/21/2016   Fistula, skin 10/03/2016   Screening examination for venereal disease 08/06/2013   Encounter for long-term (current) use of high-risk medication 08/06/2013   HIV disease (HCC) 12/29/2010   Anxiety 12/29/2010   Past Medical History:  Diagnosis Date   Anxiety    Arthritis    Candidal esophagitis (HCC)    Complication of anesthesia 07/2011   in PACU took long time for oxygen sts  to come up,2008 tonsillectomy went to ICU   Deep vein blood clot of right lower extremity (HCC)    Family history of adverse reaction to anesthesia    mother H/O n/v   Fracture of right ankle, lateral malleolus    HIV (human immunodeficiency virus infection) (HCC)    Hypertension    MAC (mycobacterium avium-intracellulare complex)    positive culture, treated for 3 months   Pulmonary embolism (HCC)     Past Surgical History:  Procedure Laterality Date   ABDOMINAL HYSTERECTOMY  03/18/2019   ABDOMINAL HYSTERECTOMY N/A 03/18/2019   Procedure: HYSTERECTOMY ABDOMINAL WITH BILATERAL SALPINGECTOMY;  Surgeon: Sarrah Rosaline, MD;  Location: [facility] OR;  Service: Gynecology;  Laterality: N/A;   ANKLE ARTHROSCOPY WITH OPEN REDUCTION INTERNAL FIXATION (ORIF) Right 10/31/2017   ANTERIOR CRUCIATE LIGAMENT REPAIR     Right and left repair   bilateral rotator cuff repari      LYMPH NODE BIOPSY Left 11/14/2016   Procedure: Left Cervical node I/D (incision and drain) and Biopsy;  Surgeon: Army Dallas NOVAK, MD;  Location: [facility] OR;  Service: Thoracic;  Laterality: Left;   NASAL RECONSTRUCTION  1982   ORIF ANKLE FRACTURE Right 11/01/2017   Procedure: OPEN REDUCTION INTERNAL FIXATION (ORIF) right lateral malleolus fracture;  Surgeon: Kit Rush, MD;  Location: [facility];  Service: Orthopedics;  Laterality: Right;   SUPRACLAVICAL NODE BIOPSY  07/28/2011   Procedure: SUPRACLAVICAL NODE BIOPSY;  Surgeon: Nancyann SHAUNNA Nam, MD;  Location: [facility] OR;  Service: Thoracic;  Laterality: Left;   TONSILLECTOMY     TOTAL KNEE ARTHROPLASTY Right 10/10/2022   Procedure: RIGHT TOTAL KNEE ARTHROPLASTY;  Surgeon: Sheril Coy, MD;  Location: [facility] ORS;  Service: Orthopedics;  Laterality: Right;    No current facility-administered medications for this encounter.   Current Outpatient Medications  Medication Sig Dispense Refill Last Dose/Taking   cetirizine (ZYRTEC) 10 MG tablet Take 10 mg by mouth daily as needed  for allergies.   Taking As Needed   darunavir  (PREZISTA ) 800 MG tablet Take 1 tablet (800 mg total) by mouth daily. 30 tablet 11 Taking   elvitegravir-cobicistat-emtricitabine -tenofovir  (GENVOYA ) 150-150-200-10 MG TABS tablet Take 1 tablet by mouth daily. 30 tablet 11 Taking   hydrochlorothiazide  (MICROZIDE ) 12.5 MG capsule Take 1 capsule (12.5 mg total) by mouth daily. 30 capsule 0 Taking   meloxicam (MOBIC) 15 MG tablet Take 15 mg by mouth daily.   Taking   Vitamin D, Ergocalciferol, 50000 units CAPS Take 1 capsule by mouth once a week.   Taking   azelastine  (ASTELIN ) 0.1 % nasal spray Place 2 sprays into both nostrils 2 (two) times daily. Use in each nostril as directed (Patient not taking: Reported on 10/05/2023) 30 mL 12 Not Taking   No Known Allergies  Social History   Tobacco Use   Smoking status: Never   Smokeless tobacco: Never  Substance Use Topics   Alcohol use: Yes    Comment: socially    Family History  Problem Relation Age of Onset   Anesthesia problems Neg Hx    Hypotension Neg Hx    Malignant hyperthermia Neg Hx    Pseudochol deficiency Neg Hx      Review of Systems  Musculoskeletal:  Positive for arthralgias.       Left knee  All other systems reviewed and are negative.   Objective:  Physical Exam Constitutional:      Appearance: Normal appearance.  HENT:     Head: Normocephalic and atraumatic.     Nose: Nose normal.     Mouth/Throat:     Pharynx: Oropharynx is clear.   Eyes:     Extraocular Movements: Extraocular movements intact.   Pulmonary:     Effort: Pulmonary effort is normal.  Abdominal:     Palpations: Abdomen is soft.   Musculoskeletal:     Cervical back: Normal range of motion.     Comments: Left knee motion is about 0-110.  She has medial and lateral joint line pain with crepitation.  She has her old healed ACL scar.  Ligaments feel stable.  Hip motion is good   Skin:    General: Skin is warm and dry.   Neurological:     General:  No focal deficit present.     Mental Status: She is alert and oriented to person, place, and time.   Psychiatric:        Mood and Affect: Mood normal.        Behavior: Behavior normal.        Thought Content: Thought content normal.        Judgment: Judgment normal.     Vital signs in last 24 hours:    Labs:   Estimated body mass index is 37.31 kg/m as calculated from the following:   Height as of 10/10/23: 5' 10 (1.778 m).   Weight as of 10/10/23: 117.9 kg.   Imaging Review Plain radiographs demonstrate severe degenerative joint disease of the left knee(s). The overall alignment isneutral. The bone quality appears to be good for age and reported activity level.      Assessment/Plan:  End stage primary arthritis, left knee   The patient history, physical examination, clinical judgment of the provider  and imaging studies are consistent with end stage degenerative joint disease of the left knee(s) and total knee arthroplasty is deemed medically necessary. The treatment options including medical management, injection therapy arthroscopy and arthroplasty were discussed at length. The risks and benefits of total knee arthroplasty were presented and reviewed. The risks due to aseptic loosening, infection, stiffness, patella tracking problems, thromboembolic complications and other imponderables were discussed. The patient acknowledged the explanation, agreed to proceed with the plan and consent was signed. Patient is being admitted for inpatient treatment for surgery, pain control, PT, OT, prophylactic antibiotics, VTE prophylaxis, progressive ambulation and ADL's and discharge planning. The patient is planning to be discharged home with home health services  Patient's anticipated LOS is less than 2 midnights, meeting these requirements: - Younger than 4 - Lives within 1 hour of care - Has a competent adult at home to recover with post-op recover - NO history of  - Chronic pain  requiring opiods  - Diabetes  - Coronary Artery Disease  - Heart failure  - Heart attack  - Stroke  - DVT/VTE  - Cardiac arrhythmia  - Respiratory Failure/COPD  - Renal failure  - Anemia  - Advanced Liver disease

## 2023-10-17 NOTE — Telephone Encounter (Signed)
 09/27/23 ov note containing risk assessment was faxed to [facility]

## 2023-10-23 ENCOUNTER — Ambulatory Visit (HOSPITAL_COMMUNITY): Admitting: Certified Registered Nurse Anesthetist

## 2023-10-23 ENCOUNTER — Encounter (HOSPITAL_COMMUNITY): Payer: Self-pay | Admitting: Orthopaedic Surgery

## 2023-10-23 ENCOUNTER — Observation Stay (HOSPITAL_COMMUNITY)
Admission: RE | Admit: 2023-10-23 | Discharge: 2023-10-24 | Disposition: A | Discharge status: Home / Self Care | Attending: Orthopaedic Surgery | Admitting: Orthopaedic Surgery

## 2023-10-23 ENCOUNTER — Encounter (HOSPITAL_COMMUNITY)
Admission: RE | Disposition: A | Discharge status: Home / Self Care | Payer: Self-pay | Source: Home / Self Care | Attending: Orthopaedic Surgery

## 2023-10-23 ENCOUNTER — Other Ambulatory Visit: Payer: Self-pay

## 2023-10-23 DIAGNOSIS — I10 Essential (primary) hypertension: Secondary | ICD-10-CM | POA: Insufficient documentation

## 2023-10-23 DIAGNOSIS — Z86711 Personal history of pulmonary embolism: Secondary | ICD-10-CM | POA: Insufficient documentation

## 2023-10-23 DIAGNOSIS — M1712 Unilateral primary osteoarthritis, left knee: Principal | ICD-10-CM | POA: Insufficient documentation

## 2023-10-23 DIAGNOSIS — Z79899 Other long term (current) drug therapy: Secondary | ICD-10-CM | POA: Diagnosis not present

## 2023-10-23 DIAGNOSIS — F419 Anxiety disorder, unspecified: Secondary | ICD-10-CM

## 2023-10-23 DIAGNOSIS — Z96651 Presence of right artificial knee joint: Secondary | ICD-10-CM | POA: Diagnosis not present

## 2023-10-23 DIAGNOSIS — I2699 Other pulmonary embolism without acute cor pulmonale: Secondary | ICD-10-CM | POA: Diagnosis not present

## 2023-10-23 DIAGNOSIS — Z86718 Personal history of other venous thrombosis and embolism: Secondary | ICD-10-CM | POA: Diagnosis not present

## 2023-10-23 DIAGNOSIS — B20 Human immunodeficiency virus [HIV] disease: Secondary | ICD-10-CM | POA: Diagnosis not present

## 2023-10-23 HISTORY — PX: TOTAL KNEE ARTHROPLASTY: SHX125

## 2023-10-23 LAB — CBC
HCT: 39.7 % (ref 36.0–46.0)
Hemoglobin: 13.1 g/dL (ref 12.0–15.0)
MCH: 30.4 pg (ref 26.0–34.0)
MCHC: 33 g/dL (ref 30.0–36.0)
MCV: 92.1 fL (ref 80.0–100.0)
Platelets: 262 K/uL (ref 150–400)
RBC: 4.31 MIL/uL (ref 3.87–5.11)
RDW: 13.4 % (ref 11.5–15.5)
WBC: 8 K/uL (ref 4.0–10.5)
nRBC: 0 % (ref 0.0–0.2)

## 2023-10-23 LAB — CREATININE, SERUM
Creatinine, Ser: 0.67 mg/dL (ref 0.44–1.00)
GFR, Estimated: >60 mL/min (ref >60)

## 2023-10-23 SURGERY — ARTHROPLASTY, KNEE, TOTAL
Anesthesia: Regional | Site: Knee | Laterality: Left

## 2023-10-23 MED ORDER — POVIDONE-IODINE 10 % EX SWAB
2.0000 | Freq: Once | CUTANEOUS | Status: AC
Start: 2023-10-23 — End: 2023-10-23
  Administered 2023-10-23: 2 via TOPICAL

## 2023-10-23 MED ORDER — POVIDONE-IODINE 7.5 % EX SOLN: Freq: Once | CUTANEOUS | Status: DC

## 2023-10-23 MED ORDER — MENTHOL 3 MG MT LOZG: 1.0000 | LOZENGE | OROMUCOSAL | Status: DC | PRN

## 2023-10-23 MED ORDER — PROPOFOL 10 MG/ML IV BOLUS
INTRAVENOUS | Status: DC | PRN
  Administered 2023-10-23: 40 mg via INTRAVENOUS

## 2023-10-23 MED ORDER — MORPHINE SULFATE (PF) 2 MG/ML IV SOLN
0.5000 mg | INTRAVENOUS | Status: DC | PRN
  Administered 2023-10-23: 1 mg via INTRAVENOUS
  Filled 2023-10-23: qty 1

## 2023-10-23 MED ORDER — PROPOFOL 1000 MG/100ML IV EMUL
INTRAVENOUS | Status: AC
  Filled 2023-10-23: qty 100

## 2023-10-23 MED ORDER — PHENOL 1.4 % MT LIQD: 1.0000 | OROMUCOSAL | Status: DC | PRN

## 2023-10-23 MED ORDER — BISACODYL 5 MG PO TBEC: 5.0000 mg | DELAYED_RELEASE_TABLET | Freq: Every day | ORAL | Status: DC | PRN

## 2023-10-23 MED ORDER — ELVITEG-COBIC-EMTRICIT-TENOFAF 150-150-200-10 MG PO TABS
1.0000 | ORAL_TABLET | Freq: Every day | ORAL | Status: DC
  Filled 2023-10-23: qty 1

## 2023-10-23 MED ORDER — METHOCARBAMOL 1000 MG/10ML IJ SOLN: 500.0000 mg | Freq: Four times a day (QID) | INTRAMUSCULAR | Status: DC | PRN

## 2023-10-23 MED ORDER — METHOCARBAMOL 500 MG PO TABS
500.0000 mg | ORAL_TABLET | Freq: Four times a day (QID) | ORAL | Status: DC | PRN
  Administered 2023-10-23 – 2023-10-24 (×2): 500 mg via ORAL
  Filled 2023-10-23 (×2): qty 1

## 2023-10-23 MED ORDER — MIDAZOLAM HCL 2 MG/2ML IJ SOLN
INTRAMUSCULAR | Status: AC
  Filled 2023-10-23: qty 2

## 2023-10-23 MED ORDER — HYDROCODONE-ACETAMINOPHEN 5-325 MG PO TABS
1.0000 | ORAL_TABLET | ORAL | Status: DC | PRN
  Administered 2023-10-23 (×2): 2 via ORAL
  Administered 2023-10-24: 1 via ORAL
  Filled 2023-10-23: qty 1
  Filled 2023-10-23 (×3): qty 2

## 2023-10-23 MED ORDER — CEFAZOLIN SODIUM-DEXTROSE 2-4 GM/100ML-% IV SOLN
2.0000 g | Freq: Four times a day (QID) | INTRAVENOUS | Status: AC
  Administered 2023-10-23 (×2): 2 g via INTRAVENOUS
  Filled 2023-10-23 (×2): qty 100

## 2023-10-23 MED ORDER — TRANEXAMIC ACID-NACL 1000-0.7 MG/100ML-% IV SOLN
1000.0000 mg | INTRAVENOUS | Status: AC
  Administered 2023-10-23: 1000 mg via INTRAVENOUS
  Filled 2023-10-23: qty 100

## 2023-10-23 MED ORDER — FENTANYL CITRATE PF 50 MCG/ML IJ SOSY: 25.0000 ug | PREFILLED_SYRINGE | INTRAMUSCULAR | Status: DC | PRN

## 2023-10-23 MED ORDER — LACTATED RINGERS IV SOLN: INTRAVENOUS | Status: DC

## 2023-10-23 MED ORDER — LORATADINE 10 MG PO TABS
10.0000 mg | ORAL_TABLET | Freq: Every day | ORAL | Status: DC
  Administered 2023-10-24: 10 mg via ORAL
  Filled 2023-10-23: qty 1

## 2023-10-23 MED ORDER — BUPIVACAINE-EPINEPHRINE (PF) 0.5% -1:200000 IJ SOLN
INTRAMUSCULAR | Status: DC | PRN
Start: 2023-10-23 — End: 2023-10-23
  Administered 2023-10-23: 30 mL via PERINEURAL

## 2023-10-23 MED ORDER — 0.9 % SODIUM CHLORIDE (POUR BTL) OPTIME
TOPICAL | Status: DC | PRN
  Administered 2023-10-23: 1000 mL

## 2023-10-23 MED ORDER — ACETAMINOPHEN 325 MG PO TABS: 325.0000 mg | ORAL_TABLET | Freq: Four times a day (QID) | ORAL | Status: DC | PRN

## 2023-10-23 MED ORDER — AMISULPRIDE (ANTIEMETIC) 5 MG/2ML IV SOLN: 10.0000 mg | Freq: Once | INTRAVENOUS | Status: DC | PRN

## 2023-10-23 MED ORDER — OXYCODONE HCL 5 MG/5ML PO SOLN: 5.0000 mg | Freq: Once | ORAL | Status: DC | PRN

## 2023-10-23 MED ORDER — ENOXAPARIN SODIUM 30 MG/0.3ML IJ SOSY
30.0000 mg | PREFILLED_SYRINGE | Freq: Two times a day (BID) | INTRAMUSCULAR | Status: DC
  Administered 2023-10-24: 30 mg via SUBCUTANEOUS
  Filled 2023-10-23: qty 0.3

## 2023-10-23 MED ORDER — KETOROLAC TROMETHAMINE 15 MG/ML IJ SOLN
15.0000 mg | Freq: Four times a day (QID) | INTRAMUSCULAR | Status: AC
  Administered 2023-10-23 – 2023-10-24 (×4): 15 mg via INTRAVENOUS
  Filled 2023-10-23 (×4): qty 1

## 2023-10-23 MED ORDER — ORAL CARE MOUTH RINSE: 15.0000 mL | Freq: Once | OROMUCOSAL | Status: AC

## 2023-10-23 MED ORDER — BUPIVACAINE IN DEXTROSE 0.75-8.25 % IT SOLN
INTRATHECAL | Status: DC | PRN
  Administered 2023-10-23: 1.7 mL via INTRATHECAL

## 2023-10-23 MED ORDER — ELVITEG-COBIC-EMTRICIT-TENOFAF 150-150-200-10 MG PO TABS
1.0000 | ORAL_TABLET | Freq: Every day | ORAL | Status: DC
  Administered 2023-10-23: 1 via ORAL
  Filled 2023-10-23 (×2): qty 1

## 2023-10-23 MED ORDER — PROPOFOL 500 MG/50ML IV EMUL
INTRAVENOUS | Status: AC
  Filled 2023-10-23: qty 50

## 2023-10-23 MED ORDER — ONDANSETRON HCL 4 MG/2ML IJ SOLN: 4.0000 mg | Freq: Four times a day (QID) | INTRAMUSCULAR | Status: DC | PRN

## 2023-10-23 MED ORDER — SODIUM CHLORIDE 0.9 % IR SOLN
Status: DC | PRN
  Administered 2023-10-23: 3000 mL

## 2023-10-23 MED ORDER — DEXAMETHASONE SODIUM PHOSPHATE 10 MG/ML IJ SOLN
INTRAMUSCULAR | Status: DC | PRN
  Administered 2023-10-23: 10 mg via INTRAVENOUS

## 2023-10-23 MED ORDER — SODIUM CHLORIDE 0.9 % IV SOLN
INTRAVENOUS | Status: DC | PRN
  Administered 2023-10-23: 80 mL

## 2023-10-23 MED ORDER — BUPIVACAINE-EPINEPHRINE (PF) 0.5% -1:200000 IJ SOLN
INTRAMUSCULAR | Status: AC
  Filled 2023-10-23: qty 30

## 2023-10-23 MED ORDER — PROPOFOL 500 MG/50ML IV EMUL
INTRAVENOUS | Status: DC | PRN
  Administered 2023-10-23: 100 ug/kg/min via INTRAVENOUS

## 2023-10-23 MED ORDER — ACETAMINOPHEN 500 MG PO TABS
500.0000 mg | ORAL_TABLET | Freq: Four times a day (QID) | ORAL | Status: AC
  Administered 2023-10-24: 500 mg via ORAL
  Filled 2023-10-23 (×2): qty 1

## 2023-10-23 MED ORDER — DARUNAVIR 800 MG PO TABS
800.0000 mg | ORAL_TABLET | Freq: Every day | ORAL | Status: DC
  Administered 2023-10-23: 800 mg via ORAL

## 2023-10-23 MED ORDER — FENTANYL CITRATE (PF) 100 MCG/2ML IJ SOLN
INTRAMUSCULAR | Status: AC
  Filled 2023-10-23: qty 2

## 2023-10-23 MED ORDER — TRANEXAMIC ACID-NACL 1000-0.7 MG/100ML-% IV SOLN
1000.0000 mg | Freq: Once | INTRAVENOUS | Status: AC
  Administered 2023-10-23: 1000 mg via INTRAVENOUS
  Filled 2023-10-23: qty 100

## 2023-10-23 MED ORDER — HYDROCHLOROTHIAZIDE 12.5 MG PO TABS
12.5000 mg | ORAL_TABLET | Freq: Every day | ORAL | Status: DC
  Administered 2023-10-24: 12.5 mg via ORAL
  Filled 2023-10-23: qty 1

## 2023-10-23 MED ORDER — METOCLOPRAMIDE HCL 5 MG PO TABS: 5.0000 mg | ORAL_TABLET | Freq: Three times a day (TID) | ORAL | Status: DC | PRN

## 2023-10-23 MED ORDER — HYDROCODONE-ACETAMINOPHEN 7.5-325 MG PO TABS: 1.0000 | ORAL_TABLET | ORAL | Status: DC | PRN

## 2023-10-23 MED ORDER — AZELASTINE HCL 0.1 % NA SOLN
2.0000 | Freq: Two times a day (BID) | NASAL | Status: DC
  Administered 2023-10-24: 2 via NASAL
  Filled 2023-10-23: qty 30

## 2023-10-23 MED ORDER — SODIUM CHLORIDE (PF) 0.9 % IJ SOLN
INTRAMUSCULAR | Status: AC
  Filled 2023-10-23: qty 30

## 2023-10-23 MED ORDER — DOCUSATE SODIUM 100 MG PO CAPS
100.0000 mg | ORAL_CAPSULE | Freq: Two times a day (BID) | ORAL | Status: DC
  Administered 2023-10-23 – 2023-10-24 (×2): 100 mg via ORAL
  Filled 2023-10-23 (×2): qty 1

## 2023-10-23 MED ORDER — DEXAMETHASONE SODIUM PHOSPHATE 10 MG/ML IJ SOLN
INTRAMUSCULAR | Status: AC
  Filled 2023-10-23: qty 1

## 2023-10-23 MED ORDER — METOCLOPRAMIDE HCL 5 MG/ML IJ SOLN: 5.0000 mg | Freq: Three times a day (TID) | INTRAMUSCULAR | Status: DC | PRN

## 2023-10-23 MED ORDER — ACETAMINOPHEN 10 MG/ML IV SOLN
1000.0000 mg | Freq: Once | INTRAVENOUS | Status: DC | PRN
Start: 2023-10-23 — End: 2023-10-23

## 2023-10-23 MED ORDER — TRANEXAMIC ACID 1000 MG/10ML IV SOLN
2000.0000 mg | Freq: Once | INTRAVENOUS | Status: AC
  Administered 2023-10-23: 2000 mg via TOPICAL
  Filled 2023-10-23: qty 20

## 2023-10-23 MED ORDER — ONDANSETRON HCL 4 MG/2ML IJ SOLN
INTRAMUSCULAR | Status: DC | PRN
  Administered 2023-10-23: 4 mg via INTRAVENOUS

## 2023-10-23 MED ORDER — FENTANYL CITRATE (PF) 100 MCG/2ML IJ SOLN
INTRAMUSCULAR | Status: DC | PRN
  Administered 2023-10-23 (×2): 50 ug via INTRAVENOUS

## 2023-10-23 MED ORDER — OXYCODONE HCL 5 MG PO TABS: 5.0000 mg | ORAL_TABLET | Freq: Once | ORAL | Status: DC | PRN

## 2023-10-23 MED ORDER — MIDAZOLAM HCL 5 MG/5ML IJ SOLN
INTRAMUSCULAR | Status: DC | PRN
  Administered 2023-10-23: 2 mg via INTRAVENOUS

## 2023-10-23 MED ORDER — ONDANSETRON HCL 4 MG PO TABS: 4.0000 mg | ORAL_TABLET | Freq: Four times a day (QID) | ORAL | Status: DC | PRN

## 2023-10-23 MED ORDER — CHLORHEXIDINE GLUCONATE 0.12 % MT SOLN
15.0000 mL | Freq: Once | OROMUCOSAL | Status: AC
  Administered 2023-10-23: 15 mL via OROMUCOSAL

## 2023-10-23 MED ORDER — ALUM & MAG HYDROXIDE-SIMETH 200-200-20 MG/5ML PO SUSP: 30.0000 mL | ORAL | Status: DC | PRN

## 2023-10-23 MED ORDER — BUPIVACAINE LIPOSOME 1.3 % IJ SUSP
INTRAMUSCULAR | Status: AC
  Filled 2023-10-23: qty 20

## 2023-10-23 MED ORDER — ONDANSETRON HCL 4 MG/2ML IJ SOLN
INTRAMUSCULAR | Status: AC
  Filled 2023-10-23: qty 2

## 2023-10-23 MED ORDER — DIPHENHYDRAMINE HCL 12.5 MG/5ML PO ELIX: 12.5000 mg | ORAL_SOLUTION | ORAL | Status: DC | PRN

## 2023-10-23 MED ORDER — CEFAZOLIN SODIUM-DEXTROSE 2-4 GM/100ML-% IV SOLN
2.0000 g | INTRAVENOUS | Status: AC
  Administered 2023-10-23: 2 g via INTRAVENOUS
  Filled 2023-10-23: qty 100

## 2023-10-23 SURGICAL SUPPLY — 50 items
ATTUNE PS FEM LT SZ 7 CEM KNEE (Femur) IMPLANT
ATTUNE PSRP INSR SZ7 6 KNEE (Insert) IMPLANT
BAG COUNTER SPONGE SURGICOUNT (BAG) ×1 IMPLANT
BAG DECANTER FOR FLEXI CONT (MISCELLANEOUS) ×2 IMPLANT
BAG ZIPLOCK 12X15 (MISCELLANEOUS) ×2 IMPLANT
BLADE SAGITTAL 25.0X1.19X90 (BLADE) ×2 IMPLANT
BLADE SAW SGTL 11.0X1.19X90.0M (BLADE) ×1 IMPLANT
BLADE SURG SZ10 CARB STEEL (BLADE) ×2 IMPLANT
BNDG ELASTIC 6INX 5YD STR LF (GAUZE/BANDAGES/DRESSINGS) ×2 IMPLANT
BOOTIES KNEE HIGH SLOAN (MISCELLANEOUS) ×1 IMPLANT
BOWL SMART MIX CTS (DISPOSABLE) ×2 IMPLANT
CEMENT HV SMART SET (Cement) ×2 IMPLANT
COVER SURGICAL LIGHT HANDLE (MISCELLANEOUS) ×1 IMPLANT
CUFF TRNQT CYL 34X4.125X (TOURNIQUET CUFF) ×2 IMPLANT
DRAPE TOP 10253 STERILE (DRAPES) ×2 IMPLANT
DRAPE U-SHAPE 47X51 STRL (DRAPES) ×2 IMPLANT
DRSG AQUACEL AG ADV 3.5X10 (GAUZE/BANDAGES/DRESSINGS) ×2 IMPLANT
DURAPREP 26ML APPLICATOR (WOUND CARE) ×4 IMPLANT
ELECT PENCIL ROCKER SW 15FT (MISCELLANEOUS) ×2 IMPLANT
ELECT REM PT RETURN 15FT ADLT (MISCELLANEOUS) ×1 IMPLANT
GLOVE BIO SURGEON STRL SZ8 (GLOVE) ×2 IMPLANT
GLOVE BIOGEL PI IND STRL 7.0 (GLOVE) ×1 IMPLANT
GLOVE BIOGEL PI IND STRL 8 (GLOVE) ×4 IMPLANT
GLOVE SURG SYN 7.0 (GLOVE) ×1 IMPLANT
GLOVE SURG SYN 7.0 PF PI (GLOVE) ×1 IMPLANT
GOWN SRG XL LVL 4 BRTHBL STRL (GOWNS) ×2 IMPLANT
GOWN STRL REUS W/ TWL XL LVL3 (GOWN DISPOSABLE) ×4 IMPLANT
HOLDER FOLEY CATH W/STRAP (MISCELLANEOUS) IMPLANT
HOOD PEEL AWAY T7 (MISCELLANEOUS) ×6 IMPLANT
INSERT TIB CMT ATTUNE RP SZ8 (Knees) IMPLANT
KIT TURNOVER KIT A (KITS) ×1 IMPLANT
MANIFOLD NEPTUNE II (INSTRUMENTS) ×2 IMPLANT
NS IRRIG 1000ML POUR BTL (IV SOLUTION) ×1 IMPLANT
PACK TOTAL KNEE CUSTOM (KITS) ×2 IMPLANT
PAD ARMBOARD POSITIONER FOAM (MISCELLANEOUS) ×1 IMPLANT
PATELLA MEDIAL ATTUN 35MM KNEE (Knees) IMPLANT
PIN STEINMAN FIXATION KNEE (PIN) IMPLANT
PROTECTOR NERVE ULNAR (MISCELLANEOUS) ×1 IMPLANT
SET HNDPC FAN SPRY TIP SCT (DISPOSABLE) ×1 IMPLANT
SPIKE FLUID TRANSFER (MISCELLANEOUS) ×4 IMPLANT
SUT ETHIBOND NAB CT1 #1 30IN (SUTURE) ×2 IMPLANT
SUT VIC AB 0 CT1 36 (SUTURE) ×2 IMPLANT
SUT VIC AB 2-0 CT1 TAPERPNT 27 (SUTURE) ×2 IMPLANT
SUT VICRYL AB 3-0 FS1 BRD 27IN (SUTURE) ×1 IMPLANT
SUTURE STRATFX 0 PDS 27 VIOLET (SUTURE) ×2 IMPLANT
TRAY FOLEY MTR SLVR 14FR STAT (SET/KITS/TRAYS/PACK) IMPLANT
TRAY FOLEY MTR SLVR 16FR STAT (SET/KITS/TRAYS/PACK) IMPLANT
WATER STERILE IRR 1000ML POUR (IV SOLUTION) ×4 IMPLANT
WRAP KNEE MAXI GEL POST OP (GAUZE/BANDAGES/DRESSINGS) ×2 IMPLANT
YANKAUER SUCT BULB TIP NO VENT (SUCTIONS) ×1 IMPLANT

## 2023-10-23 NOTE — Progress Notes (Signed)
 Orthopedic Tech Progress Note Patient Details:  [name] [birth year] [id number]  CPM Left Knee CPM Left Knee: Off Left Knee Flexion (Degrees): 90 Left Knee Extension (Degrees): 0  Post Interventions Patient Tolerated: Well Instructions Provided: Adjustment of device, Care of device  Waylan Thom Loving 10/23/2023, 2:45 PM

## 2023-10-23 NOTE — Anesthesia Procedure Notes (Signed)
 Anesthesia Regional Block: Adductor canal block   Pre-Anesthetic Checklist: , timeout performed,  Correct Patient, Correct Site, Correct Laterality,  Correct Procedure,, site marked,  Risks and benefits discussed,  Surgical consent,  Pre-op evaluation,  At surgeon's request and post-op pain management  Laterality: Left  Prep: chloraprep       Needles:  Injection technique: Single-shot  Needle Type: Echogenic Stimulator Needle     Needle Length: 10cm  Needle Gauge: 20     Additional Needles:   Procedures:,,,, ultrasound used (permanent image in chart),,    Narrative:  Start time: 10/23/2023 7:00 AM End time: 10/23/2023 7:10 AM Injection made incrementally with aspirations every 5 mL.  Performed by: Personally  Anesthesiologist: Patrisha Bernardino SQUIBB, MD  Additional Notes: Functioning IV was confirmed and monitors were applied. A time-out was performed. Hand hygiene and sterile gloves were used. The thigh was placed in a frog-leg position and prepped in a sterile fashion. A 20ga Bbraun echogenic stimulator needle was placed using ultrasound guidance.  Negative aspiration and negative test dose prior to incremental administration of local anesthetic. The patient tolerated the procedure well.

## 2023-10-23 NOTE — Interval H&P Note (Signed)
 History and Physical Interval Note:  10/23/2023 7:18 AM  [name]  has presented today for surgery, with the diagnosis of LEFT KNEE DEGENERATIVE JOINT DISEASE.  The various methods of treatment have been discussed with the patient and family. After consideration of risks, benefits and other options for treatment, the patient has consented to  Procedure(s): ARTHROPLASTY, KNEE, TOTAL (Left) as a surgical intervention.  The patient's history has been reviewed, patient examined, no change in status, stable for surgery.  I have reviewed the patient's chart and labs.  Questions were answered to the patient's satisfaction.     Kimberely Mccannon G Demetric Parslow

## 2023-10-23 NOTE — Transfer of Care (Signed)
 Immediate Anesthesia Transfer of Care Note  Patient: [name]  Procedure(s) Performed: ARTHROPLASTY, KNEE, TOTAL (Left: Knee)  Patient Location: PACU  Anesthesia Type:Spinal  Level of Consciousness: awake, alert , and oriented  Airway & Oxygen Therapy: Patient Spontanous Breathing and Patient connected to face mask oxygen  Post-op Assessment: Report given to RN and Post -op Vital signs reviewed and stable  Post vital signs: Reviewed and stable  Last Vitals:  Vitals Value Taken Time  BP    Temp    Pulse 64 10/23/23 09:54  Resp 12 10/23/23 09:54  SpO2 100 % 10/23/23 09:54  Vitals shown include unfiled device data.  Last Pain:  Vitals:   10/23/23 0551  TempSrc: Oral  PainSc: 0-No pain      Patients Stated Pain Goal: 4 (10/23/23 0551)  Complications: No notable events documented.

## 2023-10-23 NOTE — Anesthesia Procedure Notes (Signed)
 Spinal  Patient location during procedure: OR Start time: 10/23/2023 7:40 AM End time: 10/23/2023 7:45 AM Reason for block: surgical anesthesia Staffing Performed: anesthesiologist  Anesthesiologist: Patrisha Bernardino SQUIBB, MD Performed by: Patrisha Bernardino SQUIBB, MD Authorized by: Patrisha Bernardino SQUIBB, MD   Preanesthetic Checklist Completed: patient identified, IV checked, risks and benefits discussed, surgical consent, monitors and equipment checked, pre-op evaluation and timeout performed Spinal Block Patient position: sitting Prep: DuraPrep Patient monitoring: cardiac monitor, continuous pulse ox and blood pressure Approach: midline Location: L4-5 Injection technique: single-shot Needle Needle type: Pencan  Needle gauge: 24 G Needle length: 9 cm Assessment Sensory level: T10 Events: CSF return Additional Notes Functioning IV was confirmed and monitors were applied. Sterile prep and drape, including hand hygiene and sterile gloves were used. The patient was positioned and the spine was prepped. The skin was anesthetized with lidocaine .  Free flow of clear CSF was obtained prior to injecting local anesthetic into the CSF.  The spinal needle aspirated freely following injection.  The needle was carefully withdrawn.  The patient tolerated the procedure well.

## 2023-10-23 NOTE — Plan of Care (Signed)
  Problem: Education: Goal: Knowledge of General Education information will improve Description: Including pain rating scale, medication(s)/side effects and non-pharmacologic comfort measures Outcome: Progressing   Problem: Health Behavior/Discharge Planning: Goal: Ability to manage health-related needs will improve Outcome: Progressing   Problem: Clinical Measurements: Goal: Ability to maintain clinical measurements within normal limits will improve Outcome: Progressing Goal: Will remain free from infection Outcome: Progressing Goal: Diagnostic test results will improve Outcome: Progressing Goal: Respiratory complications will improve Outcome: Progressing Goal: Cardiovascular complication will be avoided Outcome: Progressing   Problem: Activity: Goal: Risk for activity intolerance will decrease Outcome: Adequate for Discharge   Problem: Nutrition: Goal: Adequate nutrition will be maintained Outcome: Completed/Met   Problem: Coping: Goal: Level of anxiety will decrease Outcome: Progressing   Problem: Elimination: Goal: Will not experience complications related to bowel motility Outcome: Progressing Goal: Will not experience complications related to urinary retention Outcome: Progressing   Problem: Pain Managment: Goal: General experience of comfort will improve and/or be controlled Outcome: Progressing   Problem: Safety: Goal: Ability to remain free from injury will improve Outcome: Progressing   Problem: Skin Integrity: Goal: Risk for impaired skin integrity will decrease Outcome: Progressing   Problem: Education: Goal: Knowledge of the prescribed therapeutic regimen will improve Outcome: Progressing Goal: Individualized Educational Video(s) Outcome: Completed/Met   Problem: Activity: Goal: Ability to avoid complications of mobility impairment will improve Outcome: Progressing Goal: Range of joint motion will improve Outcome: Adequate for Discharge    Problem: Clinical Measurements: Goal: Postoperative complications will be avoided or minimized Outcome: Progressing   Problem: Pain Management: Goal: Pain level will decrease with appropriate interventions Outcome: Adequate for Discharge   Problem: Skin Integrity: Goal: Will show signs of wound healing Outcome: Progressing

## 2023-10-23 NOTE — Progress Notes (Signed)
 Patient arrived to 1340. VS stable, patient oriented to room and call bell in reach.

## 2023-10-23 NOTE — Op Note (Signed)
 PREOP DIAGNOSIS: DJD LEFT KNEE POSTOP DIAGNOSIS:  same PROCEDURE: LEFT TKR ANESTHESIA: Spinal and MAC ATTENDING SURGEON: Maude KANDICE Herald ASSISTANT: Prentice Earl PA  INDICATIONS FOR PROCEDURE: [name] is a 48 y.o. female who has struggled for a long time with pain due to degenerative arthritis of the left knee.  The patient has failed many conservative non-operative measures and at this point has pain which limits the ability to sleep and walk.  The patient is offered total knee replacement.  Informed operative consent was obtained after discussion of possible risks of anesthesia, infection, neurovascular injury, DVT, and death.  The importance of the post-operative rehabilitation protocol to optimize result was stressed extensively with the patient.  SUMMARY OF FINDINGS AND PROCEDURE:  [name] was taken to the operative suite where under the above anesthesia a left knee replacement was performed.  There were advanced degenerative changes and the bone quality was excellent.  We used the DePuyAttune system and placed size 7 femur, 8 revision stem tibia, 35 mm all polyethylene patella, and a size 6 mm spacer.  Prentice Earl PA-C assisted throughout and was invaluable to the completion of the case in that he helped retract and maintain exposure while I placed the components.  He also helped close thereby minimizing OR time.  The patient was admitted for appropriate post-op care to include perioperative antibiotics and mechanical and pharmacologic measures for DVT prophylaxis.  DESCRIPTION OF PROCEDURE:  [name] was taken to the operative suite where the above anesthesia was applied.  The patient was positioned supine and prepped and draped in normal sterile fashion.  An appropriate time out was performed.  After the administration of kefzol  pre-op antibiotic the leg was elevated and exsanguinated and a tourniquet inflated.  A standard longitudinal incision was made on the anterior  knee.  Dissection was carried down to the extensor mechanism.  All appropriate anti-infective measures were used including the pre-operative antibiotic, betadine  impregnated drape, and closed hooded exhaust systems for each member of the surgical team.  A medial parapatellar incision was made in the extensor mechanism and the knee cap flipped and the knee flexed.  Some residual meniscal tissues were removed along with any remaining ACL/PCL tissue.  A guide was placed on the tibia and a flat cut was made on it's superior surface.  An intramedullary guide was placed in the femur and was utilized to make anterior and posterior cuts creating an appropriate flexion gap.  A second intramedullary guide was placed in the femur to make a distal cut properly balancing the knee with an extension gap equal to the flexion gap.  The three bones sized to the above mentioned sizes and the appropriate guides were placed and utilized.  A trial reduction was done and the knee easily came to full extension and the patella tracked well on flexion.  The trial components were removed and all bones were cleaned with pulsatile lavage and then dried thoroughly.  Cement was mixed and was pressurized onto the bones followed by placement of the aforementioned components.  Excess cement was trimmed and pressure was held on the components until the cement had hardened.  The tourniquet was deflated and a small amount of bleeding was controlled with cautery and pressure.  The knee was irrigated thoroughly.  The extensor mechanism was re-approximated with #1 ethibond in interrupted fashion.  The knee was flexed and the repair was solid.  The subcutaneous tissues were re-approximated with #0 and #2-0 vicryl and  the skin closed with a subcuticular stitch and steristrips.  A sterile dressing was applied.  Intraoperative fluids, EBL, and tourniquet time can be obtained from anesthesia records.  DISPOSITION:  The patient was taken to recovery room in  stable condition and scheduled to potentially go home same day depending on ability to walk and tolerate liquids.  Maude MATSU Gar Glance 10/23/2023, 9:18 AM

## 2023-10-23 NOTE — Anesthesia Preprocedure Evaluation (Addendum)
 Anesthesia Evaluation  Patient identified by MRN, date of birth, ID band Patient awake    Reviewed: Allergy & Precautions, NPO status , Patient's Chart, lab work & pertinent test results  Airway Mallampati: III  TM Distance: >3 FB Neck ROM: Full    Dental no notable dental hx.    Pulmonary PE   Pulmonary exam normal        Cardiovascular hypertension, Pt. on medications Normal cardiovascular exam     Neuro/Psych   Anxiety     negative neurological ROS     GI/Hepatic negative GI ROS, Neg liver ROS,,,  Endo/Other  negative endocrine ROS    Renal/GU negative Renal ROS     Musculoskeletal  (+) Arthritis , Osteoarthritis,    Abdominal  (+) + obese  Peds  Hematology  (+) HIV  Anesthesia Other Findings LEFT KNEE DEGENERATIVE JOINT DISEASE  Reproductive/Obstetrics                              Anesthesia Physical Anesthesia Plan  ASA: 3  Anesthesia Plan: Spinal and Regional   Post-op Pain Management: Regional block*   Induction:   PONV Risk Score and Plan: 2 and Ondansetron , Dexamethasone , Propofol  infusion, Midazolam  and Treatment may vary due to age or medical condition  Airway Management Planned: Simple Face Mask  Additional Equipment:   Intra-op Plan:   Post-operative Plan:   Informed Consent: I have reviewed the patients History and Physical, chart, labs and discussed the procedure including the risks, benefits and alternatives for the proposed anesthesia with the patient or authorized representative who has indicated his/her understanding and acceptance.     Dental advisory given  Plan Discussed with: CRNA  Anesthesia Plan Comments:         Anesthesia Quick Evaluation

## 2023-10-23 NOTE — Anesthesia Postprocedure Evaluation (Signed)
 Anesthesia Post Note  Patient: [name]  Procedure(s) Performed: ARTHROPLASTY, KNEE, TOTAL (Left: Knee)     Patient location during evaluation: PACU Anesthesia Type: Regional and Spinal Level of consciousness: awake Pain management: pain level controlled Vital Signs Assessment: post-procedure vital signs reviewed and stable Respiratory status: spontaneous breathing, nonlabored ventilation and respiratory function stable Cardiovascular status: blood pressure returned to baseline and stable Postop Assessment: no apparent nausea or vomiting Anesthetic complications: no   No notable events documented.  Last Vitals:  Vitals:   10/23/23 1324 10/23/23 1738  BP: 139/81 130/76  Pulse: 82 84  Resp: 17 16  Temp: 36.8 C (!) 36.4 C  SpO2: 97% 96%    Last Pain:  Vitals:   10/23/23 1710  TempSrc:   PainSc: 6                  Arjan Strohm P Marlise Fahr

## 2023-10-23 NOTE — Progress Notes (Signed)
 Orthopedic Tech Progress Note Patient Details:  [name] 05-02-1975 [id number]  CPM Left Knee CPM Left Knee: On Left Knee Flexion (Degrees): 90 Left Knee Extension (Degrees): 0  Post Interventions Patient Tolerated: Well Instructions Provided: Adjustment of device, Care of device  Waylan Thom Loving 10/23/2023, 10:23 AM

## 2023-10-23 NOTE — Evaluation (Signed)
 Physical Therapy Evaluation Patient Details Name: [name] MRN: [id number] DOB: 06-04-1975 Today's Date: 10/23/2023  History of Present Illness  Pt is 48 yo female s/p L TKA on 10/23/23.  Pt with hx including but is not limited to: R TKA, R LE DVT, B PE, granulomatous adenopathy, HIV, and R ankle fx.  Clinical Impression  Pt is s/p TKA resulting in the deficits listed below (see PT Problem List). At baseline, pt independent and lives alone.  She has arranged for her mother to stay with her at d/c and has DME.  Today, pt with good pain control and quad activation. She was able to ambulate 79' with RW and CGA.  Expected to progress well.  Pt will benefit from acute skilled PT to increase their independence and safety with mobility to allow discharge.          If plan is discharge home, recommend the following: A little help with walking and/or transfers;A little help with bathing/dressing/bathroom;Assistance with cooking/housework;Help with stairs or ramp for entrance   Can travel by private vehicle        Equipment Recommendations None recommended by PT  Recommendations for Other Services       Functional Status Assessment Patient has had a recent decline in their functional status and demonstrates the ability to make significant improvements in function in a reasonable and predictable amount of time.     Precautions / Restrictions Precautions Precautions: Fall;Knee Restrictions Weight Bearing Restrictions Per Provider Order: Yes LLE Weight Bearing Per Provider Order: Weight bearing as tolerated      Mobility  Bed Mobility Overal bed mobility: Needs Assistance Bed Mobility: Supine to Sit     Supine to sit: Min assist          Transfers Overall transfer level: Needs assistance Equipment used: Rolling walker (2 wheels) Transfers: Sit to/from Stand Sit to Stand: Contact guard assist           General transfer comment: Stood from bed (elevated ) and BSC; cues  for L LE management    Ambulation/Gait Ambulation/Gait assistance: Contact guard assist Gait Distance (Feet): 60 Feet Assistive device: Rolling walker (2 wheels) Gait Pattern/deviations: Step-to pattern, Decreased stride length, Decreased weight shift to left Gait velocity: decreased     General Gait Details: cues for pushign RW  Stairs            Wheelchair Mobility     Tilt Bed    Modified Rankin (Stroke Patients Only)       Balance Overall balance assessment: Needs assistance Sitting-balance support: No upper extremity supported Sitting balance-Leahy Scale: Good     Standing balance support: Bilateral upper extremity supported, Reliant on assistive device for balance Standing balance-Leahy Scale: Poor Standing balance comment: steady wtih RW                             Pertinent Vitals/Pain Pain Assessment Pain Assessment: 0-10 Pain Score: 3  Pain Location: L TKA Pain Descriptors / Indicators: Discomfort Pain Intervention(s): Limited activity within patient's tolerance, Monitored during session, Premedicated before session, Repositioned    Home Living Family/patient expects to be discharged to:: Private residence Living Arrangements: Alone Available Help at Discharge: Family;Available 24 hours/day (mother going to stay) Type of Home: Other(Comment) (condo) Home Access: Other (comment) (curb)       Home Layout: One level Home Equipment: Shower seat;Grab bars - tub/shower;Rolling Walker (2 wheels);Cane - single point  Prior Function Prior Level of Function : Independent/Modified Independent;Working/employed;Driving             Mobility Comments: could ambulate in community wihtout AD ADLs Comments: independent adls and iadls     Extremity/Trunk Assessment   Upper Extremity Assessment Upper Extremity Assessment: Overall WFL for tasks assessed    Lower Extremity Assessment Lower Extremity Assessment: LLE  deficits/detail;RLE deficits/detail RLE Deficits / Details: ROM WFL; MMT 5/5 LLE Deficits / Details: Expected post op changes; ROM: knee 5 to 60 degrees; MMT: ankle 5/5, knee 3/5, hip 3/5    Cervical / Trunk Assessment Cervical / Trunk Assessment: Normal  Communication        Cognition Arousal: Alert Behavior During Therapy: WFL for tasks assessed/performed   PT - Cognitive impairments: No apparent impairments                                 Cueing       General Comments General comments (skin integrity, edema, etc.): vss    Exercises     Assessment/Plan    PT Assessment Patient needs continued PT services  PT Problem List Decreased strength;Decreased range of motion;Decreased activity tolerance;Decreased balance;Decreased mobility;Decreased knowledge of use of DME;Pain       PT Treatment Interventions DME instruction;Therapeutic exercise;Gait training;Stair training;Functional mobility training;Therapeutic activities;Patient/family education;Modalities;Balance training    PT Goals (Current goals can be found in the Care Plan section)  Acute Rehab PT Goals Patient Stated Goal: return home PT Goal Formulation: With patient Time For Goal Achievement: 11/06/23 Potential to Achieve Goals: Good    Frequency 7X/week     Co-evaluation               AM-PAC PT 6 Clicks Mobility  Outcome Measure Help needed turning from your back to your side while in a flat bed without using bedrails?: A Little Help needed moving from lying on your back to sitting on the side of a flat bed without using bedrails?: A Little Help needed moving to and from a bed to a chair (including a wheelchair)?: A Little Help needed standing up from a chair using your arms (e.g., wheelchair or bedside chair)?: A Little Help needed to walk in hospital room?: A Little Help needed climbing 3-5 steps with a railing? : A Little 6 Click Score: 18    End of Session Equipment Utilized  During Treatment: Gait belt Activity Tolerance: Patient tolerated treatment well Patient left: with call bell/phone within reach;in chair (knows to call for assist) Nurse Communication: Mobility status PT Visit Diagnosis: Other abnormalities of gait and mobility (R26.89);Muscle weakness (generalized) (M62.81)    Time: 8569-8541 PT Time Calculation (min) (ACUTE ONLY): 28 min   Charges:   PT Evaluation $PT Eval Low Complexity: 1 Low PT Treatments $Gait Training: 8-22 mins PT General Charges $$ ACUTE PT VISIT: 1 Visit         Anna, PT Acute Rehab [facility] Rehab [phone]   [name] 10/23/2023, 4:24 PM

## 2023-10-24 ENCOUNTER — Encounter (HOSPITAL_COMMUNITY): Payer: Self-pay | Admitting: Orthopaedic Surgery

## 2023-10-24 ENCOUNTER — Other Ambulatory Visit (HOSPITAL_COMMUNITY): Payer: Self-pay

## 2023-10-24 ENCOUNTER — Other Ambulatory Visit: Payer: Self-pay

## 2023-10-24 DIAGNOSIS — M1712 Unilateral primary osteoarthritis, left knee: Secondary | ICD-10-CM | POA: Diagnosis not present

## 2023-10-24 MED ORDER — TIZANIDINE HCL 4 MG PO TABS: 4.0000 mg | ORAL_TABLET | Freq: Four times a day (QID) | ORAL | 1 refills | Status: DC | PRN

## 2023-10-24 MED ORDER — ENOXAPARIN SODIUM 30 MG/0.3ML IJ SOSY: 30.0000 mg | PREFILLED_SYRINGE | Freq: Two times a day (BID) | INTRAMUSCULAR | 0 refills | Status: DC

## 2023-10-24 MED ORDER — HYDROCODONE-ACETAMINOPHEN 5-325 MG PO TABS
1.0000 | ORAL_TABLET | Freq: Four times a day (QID) | ORAL | 0 refills | Status: AC | PRN
Start: 2023-10-24 — End: ?

## 2023-10-24 MED ORDER — ENOXAPARIN SODIUM 30 MG/0.3ML IJ SOSY
30.0000 mg | PREFILLED_SYRINGE | Freq: Two times a day (BID) | INTRAMUSCULAR | 0 refills | Status: DC
  Filled 2023-10-24: qty 3, 5d supply, fill #0
  Filled 2023-10-24: qty 15, 25d supply, fill #0

## 2023-10-24 NOTE — TOC Transition Note (Signed)
 Transition of Care [facility]) - Discharge Note   Patient Details  Name: [name] MRN: [id number] Date of Birth: 10/06/1975  [facility] CM/SW Contact:  Alfonse JONELLE Rex, RN Phone Number: 10/24/2023, 10:41 AM   Clinical Narrative:   Met with patient at bedside to review dc therapy and home equipment needs. Patient unsure of therapy follow up at discharge. Text sent to Prentice Earl, Ortho PA, confirmed OPPT. NCM called to Ortho office to confirm OPPT location, await call back. Patient confirmed she has a RW. No further TOC needs.     Final next level of care: OP Rehab     Patient Goals and CMS Choice Patient states their goals for this hospitalization and ongoing recovery are:: return home          Discharge Placement                       Discharge Plan and Services Additional resources added to the After Visit Summary for                                       Social Drivers of Health (SDOH) Interventions SDOH Screenings   Food Insecurity: No Food Insecurity (10/23/2023)  Housing: Low Risk  (10/23/2023)  Transportation Needs: No Transportation Needs (10/23/2023)  Utilities: Not At Risk (10/23/2023)  Depression (PHQ2-9): Low Risk  (05/24/2023)  Social Connections: Unknown (08/29/2021)   Received from [facility]  Tobacco Use: Low Risk  (10/23/2023)     Readmission Risk Interventions     No data to display

## 2023-10-24 NOTE — Plan of Care (Signed)
  Problem: Education: Goal: Knowledge of General Education information will improve Description: Including pain rating scale, medication(s)/side effects and non-pharmacologic comfort measures 10/24/2023 0825 by Alaina Dozier PARAS, RN Outcome: Adequate for Discharge 10/24/2023 0816 by Alaina Dozier PARAS, RN Outcome: Progressing   Problem: Health Behavior/Discharge Planning: Goal: Ability to manage health-related needs will improve 10/24/2023 0825 by Alaina Dozier PARAS, RN Outcome: Adequate for Discharge 10/24/2023 0816 by Alaina Dozier PARAS, RN Outcome: Progressing   Problem: Clinical Measurements: Goal: Ability to maintain clinical measurements within normal limits will improve 10/24/2023 0825 by Alaina Dozier PARAS, RN Outcome: Adequate for Discharge 10/24/2023 0816 by Alaina Dozier PARAS, RN Outcome: Progressing Goal: Will remain free from infection 10/24/2023 0825 by Alaina Dozier PARAS, RN Outcome: Adequate for Discharge 10/24/2023 [phone] by Alaina Dozier PARAS, RN Outcome: Progressing Goal: Diagnostic test results will improve 10/24/2023 0825 by Alaina Dozier PARAS, RN Outcome: Adequate for Discharge 10/24/2023 0816 by Alaina Dozier PARAS, RN Outcome: Progressing Goal: Respiratory complications will improve Outcome: Adequate for Discharge Goal: Cardiovascular complication will be avoided Outcome: Adequate for Discharge   Problem: Activity: Goal: Risk for activity intolerance will decrease Outcome: Adequate for Discharge   Problem: Coping: Goal: Level of anxiety will decrease Outcome: Adequate for Discharge   Problem: Elimination: Goal: Will not experience complications related to bowel motility Outcome: Adequate for Discharge Goal: Will not experience complications related to urinary retention Outcome: Adequate for Discharge   Problem: Pain Managment: Goal: General experience of comfort will improve and/or be controlled Outcome: Adequate for Discharge    Problem: Safety: Goal: Ability to remain free from injury will improve Outcome: Adequate for Discharge   Problem: Skin Integrity: Goal: Risk for impaired skin integrity will decrease Outcome: Adequate for Discharge   Problem: Education: Goal: Knowledge of the prescribed therapeutic regimen will improve Outcome: Adequate for Discharge   Problem: Activity: Goal: Ability to avoid complications of mobility impairment will improve Outcome: Adequate for Discharge Goal: Range of joint motion will improve Outcome: Adequate for Discharge   Problem: Clinical Measurements: Goal: Postoperative complications will be avoided or minimized Outcome: Adequate for Discharge   Problem: Pain Management: Goal: Pain level will decrease with appropriate interventions Outcome: Adequate for Discharge   Problem: Skin Integrity: Goal: Will show signs of wound healing Outcome: Adequate for Discharge

## 2023-10-24 NOTE — Plan of Care (Signed)

## 2023-10-24 NOTE — Progress Notes (Signed)
 Subjective: 1 Day Post-Op Procedure(s) (LRB): ARTHROPLASTY, KNEE, TOTAL (Left)  Patient doing well. Hoping to go home today.  Activity level:  wbat Diet tolerance:  ok Voiding:  ok Patient reports pain as mild.    Objective: Vital signs in last 24 hours: Temp:  [96.8 F (36 C)-98.2 F (36.8 C)] 97.7 F (36.5 C) (07/09 0549) Pulse Rate:  [55-84] 78 (07/09 0549) Resp:  [12-25] 18 (07/09 0549) BP: (105-139)/(63-81) 134/77 (07/09 0549) SpO2:  [92 %-100 %] 99 % (07/09 0549)  Labs: Recent Labs    10/23/23 1234  HGB 13.1   Recent Labs    10/23/23 1234  WBC 8.0  RBC 4.31  HCT 39.7  PLT 262   Recent Labs    10/23/23 1234  CREATININE 0.67   No results for input(s): LABPT, INR in the last 72 hours.  Physical Exam:  Neurologically intact ABD soft Neurovascular intact Sensation intact distally Intact pulses distally Dorsiflexion/Plantar flexion intact Incision: dressing C/D/I and no drainage No cellulitis present Compartment soft  Assessment/Plan:  1 Day Post-Op Procedure(s) (LRB): ARTHROPLASTY, KNEE, TOTAL (Left) Advance diet Up with therapy D/C IV fluids D/C home today if cleared by PT and doing well. Follow up in office 2 weeks post op.   [name] 10/24/2023, 8:09 AM

## 2023-10-24 NOTE — Discharge Summary (Signed)
 Patient ID: [name] MRN: [id number] DOB/AGE: [birth year] 48 y.o.  Admit date: 10/23/2023 Discharge date: 10/24/2023  Admission Diagnoses:  Principal Problem:   Primary localized osteoarthritis of left knee   Discharge Diagnoses:  Same  Past Medical History:  Diagnosis Date   Anxiety    Arthritis    Candidal esophagitis (HCC)    Complication of anesthesia 07/2011   in PACU took long time for oxygen sts to come up,2008 tonsillectomy went to ICU   Deep vein blood clot of right lower extremity (HCC)    Family history of adverse reaction to anesthesia    mother H/O n/v   Fracture of right ankle, lateral malleolus    HIV (human immunodeficiency virus infection) (HCC)    Hypertension    MAC (mycobacterium avium-intracellulare complex)    positive culture, treated for 3 months   Pulmonary embolism (HCC)     Surgeries: Procedure(s): ARTHROPLASTY, KNEE, TOTAL on 10/23/2023   Consultants:   Discharged Condition: Improved  Hospital Course: [name] is an 48 y.o. female who was admitted 10/23/2023 for operative treatment ofPrimary localized osteoarthritis of left knee. Patient has severe unremitting pain that affects sleep, daily activities, and work/hobbies. After pre-op clearance the patient was taken to the operating room on 10/23/2023 and underwent  Procedure(s): ARTHROPLASTY, KNEE, TOTAL.    Patient was given perioperative antibiotics:  Anti-infectives (From admission, onward)    Start     Dose/Rate Route Frequency Ordered Stop   10/23/23 2000  darunavir  (PREZISTA ) tablet 800 mg        800 mg Oral Daily 10/23/23 1127     10/23/23 2000  elvitegravir-cobicistat-emtricitabine -tenofovir  (GENVOYA ) 150-150-200-10 MG tablet 1 tablet        1 tablet Oral Daily 10/23/23 1233     10/23/23 1400  elvitegravir-cobicistat-emtricitabine -tenofovir  (GENVOYA ) 150-150-200-10 MG tablet 1 tablet  Status:  Discontinued        1 tablet Oral Daily 10/23/23 1127 10/23/23 1233   10/23/23  1400  ceFAZolin  (ANCEF ) IVPB 2g/100 mL premix        2 g 200 mL/hr over 30 Minutes Intravenous Every 6 hours 10/23/23 1127 10/23/23 2150   10/23/23 0600  ceFAZolin  (ANCEF ) IVPB 2g/100 mL premix        2 g 200 mL/hr over 30 Minutes Intravenous On call to O.R. 10/23/23 0533 10/23/23 0746        Patient was given sequential compression devices, early ambulation, and chemoprophylaxis to prevent DVT.  Patient benefited maximally from hospital stay and there were no complications.    Recent vital signs: Patient Vitals for the past 24 hrs:  BP Temp Temp src Pulse Resp SpO2  10/24/23 0549 134/77 97.7 F (36.5 C) Oral 78 18 99 %  10/24/23 0118 127/74 98 F (36.7 C) -- 66 18 94 %  10/23/23 2211 136/63 98 F (36.7 C) Oral 80 18 96 %  10/23/23 1738 130/76 (!) 97.5 F (36.4 C) -- 84 16 96 %  10/23/23 1324 139/81 98.2 F (36.8 C) Oral 82 17 97 %  10/23/23 1129 113/77 97.6 F (36.4 C) Oral 64 14 92 %  10/23/23 1100 117/72 97.6 F (36.4 C) -- 62 12 94 %  10/23/23 1050 -- -- -- (!) 55 15 97 %  10/23/23 1045 125/78 -- -- (!) 56 12 96 %  10/23/23 1030 108/69 -- -- 65 19 95 %  10/23/23 1028 -- -- -- (!) 59 19 94 %  10/23/23 1015 125/75 -- -- 60 16 100 %  10/23/23 1006 -- -- -- 61 (!) 25 100 %  10/23/23 1000 117/72 -- -- (!) 59 20 100 %  10/23/23 0956 105/69 (!) 96.8 F (36 C) -- 63 (!) 24 100 %     Recent laboratory studies:  Recent Labs    10/23/23 1234  WBC 8.0  HGB 13.1  HCT 39.7  PLT 262  CREATININE 0.67     Discharge Medications:   Allergies as of 10/24/2023   No Known Allergies      Medication List     STOP taking these medications    meloxicam 15 MG tablet Commonly known as: MOBIC       TAKE these medications    azelastine  0.1 % nasal spray Commonly known as: ASTELIN  Place 2 sprays into both nostrils 2 (two) times daily. Use in each nostril as directed   cetirizine 10 MG tablet Commonly known as: ZYRTEC Take 10 mg by mouth daily as needed for  allergies.   darunavir  800 MG tablet Commonly known as: PREZISTA  Take 1 tablet (800 mg total) by mouth daily.   enoxaparin  30 MG/0.3ML injection Commonly known as: LOVENOX  Inject 0.3 mLs (30 mg total) into the skin every 12 (twelve) hours.   Genvoya  150-150-200-10 MG Tabs tablet Generic drug: elvitegravir-cobicistat-emtricitabine -tenofovir  Take 1 tablet by mouth daily.   hydrochlorothiazide  12.5 MG capsule Commonly known as: MICROZIDE  Take 1 capsule (12.5 mg total) by mouth daily.   HYDROcodone -acetaminophen  5-325 MG tablet Commonly known as: NORCO/VICODIN Take 1-2 tablets by mouth every 6 (six) hours as needed for moderate pain (pain score 4-6) or severe pain (pain score 7-10) (post op pain).   tiZANidine  4 MG tablet Commonly known as: Zanaflex  Take 1 tablet (4 mg total) by mouth every 6 (six) hours as needed for muscle spasms.   Vitamin D (Ergocalciferol) 50000 units Caps Take 1 capsule by mouth once a week.               Durable Medical Equipment  (From admission, onward)           Start     Ordered   10/23/23 1128  DME Walker rolling  Once       Question:  Patient needs a walker to treat with the following condition  Answer:  Primary osteoarthritis of left knee   10/23/23 1127   10/23/23 1128  DME 3 n 1  Once        10/23/23 1127   10/23/23 1128  DME Bedside commode  Once       Question:  Patient needs a bedside commode to treat with the following condition  Answer:  Primary osteoarthritis of left knee   10/23/23 1127            Diagnostic Studies: DG Chest 2 View Result Date: 10/10/2023 CLINICAL DATA:  Preop left total knee replacement. EXAM: CHEST - 2 VIEW COMPARISON:  09/29/2022 FINDINGS: The cardiomediastinal contours are normal. The lungs are clear. Pulmonary vasculature is normal. No consolidation, pleural effusion, or pneumothorax. No acute osseous abnormalities are seen. IMPRESSION: No active cardiopulmonary disease. Electronically Signed   By:  Andrea Gasman M.D.   On: 10/10/2023 15:02    Disposition: Discharge disposition: 01-Home or Self Care       Discharge Instructions     Call MD / Call 911   Complete by: As directed    If you experience chest pain or shortness of breath, CALL 911 and be transported to the hospital emergency room.  If  you develope a fever above 101 F, pus (white drainage) or increased drainage or redness at the wound, or calf pain, call your surgeon's office.   Constipation Prevention   Complete by: As directed    Drink plenty of fluids.  Prune juice may be helpful.  You may use a stool softener, such as Colace (over the counter) 100 mg twice a day.  Use MiraLax (over the counter) for constipation as needed.   Diet - low sodium heart healthy   Complete by: As directed    Discharge instructions   Complete by: As directed    INSTRUCTIONS AFTER JOINT REPLACEMENT   Remove items at home which could result in a fall. This includes throw rugs or furniture in walking pathways ICE to the affected joint every three hours while awake for 30 minutes at a time, for at least the first 3-5 days, and then as needed for pain and swelling.  Continue to use ice for pain and swelling. You may notice swelling that will progress down to the foot and ankle.  This is normal after surgery.  Elevate your leg when you are not up walking on it.   Continue to use the breathing machine you got in the hospital (incentive spirometer) which will help keep your temperature down.  It is common for your temperature to cycle up and down following surgery, especially at night when you are not up moving around and exerting yourself.  The breathing machine keeps your lungs expanded and your temperature down.   DIET:  As you were doing prior to hospitalization, we recommend a well-balanced diet.  DRESSING / WOUND CARE / SHOWERING  You may change your dressing 3-5 days after surgery.  Then change the dressing every day with sterile gauze.   Please use good hand washing techniques before changing the dressing.  Do not use any lotions or creams on the incision until instructed by your surgeon.  ACTIVITY  Increase activity slowly as tolerated, but follow the weight bearing instructions below.   No driving for 6 weeks or until further direction given by your physician.  You cannot drive while taking narcotics.  No lifting or carrying greater than 10 lbs. until further directed by your surgeon. Avoid periods of inactivity such as sitting longer than an hour when not asleep. This helps prevent blood clots.  You may return to work once you are authorized by your doctor.     WEIGHT BEARING   Weight bearing as tolerated with assist device (walker, cane, etc) as directed, use it as long as suggested by your surgeon or therapist, typically at least 4-6 weeks.   EXERCISES  Results after joint replacement surgery are often greatly improved when you follow the exercise, range of motion and muscle strengthening exercises prescribed by your doctor. Safety measures are also important to protect the joint from further injury. Any time any of these exercises cause you to have increased pain or swelling, decrease what you are doing until you are comfortable again and then slowly increase them. If you have problems or questions, call your caregiver or physical therapist for advice.   Rehabilitation is important following a joint replacement. After just a few days of immobilization, the muscles of the leg can become weakened and shrink (atrophy).  These exercises are designed to build up the tone and strength of the thigh and leg muscles and to improve motion. Often times heat used for twenty to thirty minutes before working out will loosen up  your tissues and help with improving the range of motion but do not use heat for the first two weeks following surgery (sometimes heat can increase post-operative swelling).   These exercises can be done on a  training (exercise) mat, on the floor, on a table or on a bed. Use whatever works the best and is most comfortable for you.    Use music or television while you are exercising so that the exercises are a pleasant break in your day. This will make your life better with the exercises acting as a break in your routine that you can look forward to.   Perform all exercises about fifteen times, three times per day or as directed.  You should exercise both the operative leg and the other leg as well.  Exercises include:   Quad Sets - Tighten up the muscle on the front of the thigh (Quad) and hold for 5-10 seconds.   Straight Leg Raises - With your knee straight (if you were given a brace, keep it on), lift the leg to 60 degrees, hold for 3 seconds, and slowly lower the leg.  Perform this exercise against resistance later as your leg gets stronger.  Leg Slides: Lying on your back, slowly slide your foot toward your buttocks, bending your knee up off the floor (only go as far as is comfortable). Then slowly slide your foot back down until your leg is flat on the floor again.  Angel Wings: Lying on your back spread your legs to the side as far apart as you can without causing discomfort.  Hamstring Strength:  Lying on your back, push your heel against the floor with your leg straight by tightening up the muscles of your buttocks.  Repeat, but this time bend your knee to a comfortable angle, and push your heel against the floor.  You may put a pillow under the heel to make it more comfortable if necessary.   A rehabilitation program following joint replacement surgery can speed recovery and prevent re-injury in the future due to weakened muscles. Contact your doctor or a physical therapist for more information on knee rehabilitation.    CONSTIPATION  Constipation is defined medically as fewer than three stools per week and severe constipation as less than one stool per week.  Even if you have a regular bowel  pattern at home, your normal regimen is likely to be disrupted due to multiple reasons following surgery.  Combination of anesthesia, postoperative narcotics, change in appetite and fluid intake all can affect your bowels.   YOU MUST use at least one of the following options; they are listed in order of increasing strength to get the job done.  They are all available over the counter, and you may need to use some, POSSIBLY even all of these options:    Drink plenty of fluids (prune juice may be helpful) and high fiber foods Colace 100 mg by mouth twice a day  Senokot for constipation as directed and as needed Dulcolax (bisacodyl ), take with full glass of water   Miralax (polyethylene glycol) once or twice a day as needed.  If you have tried all these things and are unable to have a bowel movement in the first 3-4 days after surgery call either your surgeon or your primary doctor.    If you experience loose stools or diarrhea, hold the medications until you stool forms back up.  If your symptoms do not get better within 1 week or if they get worse,  check with your doctor.  If you experience the worst abdominal pain ever or develop nausea or vomiting, please contact the office immediately for further recommendations for treatment.   ITCHING:  If you experience itching with your medications, try taking only a single pain pill, or even half a pain pill at a time.  You can also use Benadryl  over the counter for itching or also to help with sleep.   TED HOSE STOCKINGS:  Use stockings on both legs until for at least 2 weeks or as directed by physician office. They may be removed at night for sleeping.  MEDICATIONS:  See your medication summary on the After Visit Summary that nursing will review with you.  You may have some home medications which will be placed on hold until you complete the course of blood thinner medication.  It is important for you to complete the blood thinner medication as  prescribed.  PRECAUTIONS:  If you experience chest pain or shortness of breath - call 911 immediately for transfer to the hospital emergency department.   If you develop a fever greater that 101 F, purulent drainage from wound, increased redness or drainage from wound, foul odor from the wound/dressing, or calf pain - CONTACT YOUR SURGEON.                                                   FOLLOW-UP APPOINTMENTS:  If you do not already have a post-op appointment, please call the office for an appointment to be seen by your surgeon.  Guidelines for how soon to be seen are listed in your After Visit Summary, but are typically between 1-4 weeks after surgery.  OTHER INSTRUCTIONS:   Knee Replacement:  Do not place pillow under knee, focus on keeping the knee straight while resting. CPM instructions: 0-90 degrees, 2 hours in the morning, 2 hours in the afternoon, and 2 hours in the evening. Place foam block, curve side up under heel at all times except when in CPM or when walking.  DO NOT modify, tear, cut, or change the foam block in any way.  POST-OPERATIVE OPIOID TAPER INSTRUCTIONS: It is important to wean off of your opioid medication as soon as possible. If you do not need pain medication after your surgery it is ok to stop day one. Opioids include: Codeine, Hydrocodone (Norco, Vicodin), Oxycodone (Percocet, oxycontin ) and hydromorphone  amongst others.  Long term and even short term use of opiods can cause: Increased pain response Dependence Constipation Depression Respiratory depression And more.  Withdrawal symptoms can include Flu like symptoms Nausea, vomiting And more Techniques to manage these symptoms Hydrate well Eat regular healthy meals Stay active Use relaxation techniques(deep breathing, meditating, yoga) Do Not substitute Alcohol to help with tapering If you have been on opioids for less than two weeks and do not have pain than it is ok to stop all together.  Plan to  wean off of opioids This plan should start within one week post op of your joint replacement. Maintain the same interval or time between taking each dose and first decrease the dose.  Cut the total daily intake of opioids by one tablet each day Next start to increase the time between doses. The last dose that should be eliminated is the evening dose.     MAKE SURE YOU:  Understand these instructions.  Get help  right away if you are not doing well or get worse.    Thank you for letting us  be a part of your medical care team.  It is a privilege we respect greatly.  We hope these instructions will help you stay on track for a fast and full recovery!   Increase activity slowly as tolerated   Complete by: As directed    Post-operative opioid taper instructions:   Complete by: As directed    POST-OPERATIVE OPIOID TAPER INSTRUCTIONS: It is important to wean off of your opioid medication as soon as possible. If you do not need pain medication after your surgery it is ok to stop day one. Opioids include: Codeine, Hydrocodone (Norco, Vicodin), Oxycodone (Percocet, oxycontin ) and hydromorphone  amongst others.  Long term and even short term use of opiods can cause: Increased pain response Dependence Constipation Depression Respiratory depression And more.  Withdrawal symptoms can include Flu like symptoms Nausea, vomiting And more Techniques to manage these symptoms Hydrate well Eat regular healthy meals Stay active Use relaxation techniques(deep breathing, meditating, yoga) Do Not substitute Alcohol to help with tapering If you have been on opioids for less than two weeks and do not have pain than it is ok to stop all together.  Plan to wean off of opioids This plan should start within one week post op of your joint replacement. Maintain the same interval or time between taking each dose and first decrease the dose.  Cut the total daily intake of opioids by one tablet each day Next  start to increase the time between doses. The last dose that should be eliminated is the evening dose.           Follow-up Information     Sheril Coy, MD. Schedule an appointment as soon as possible for a visit in 2 week(s).   Specialty: Orthopedic Surgery Contact information: [address] [place] [address] [phone]                  Signed: Prentice Mt Jaymeson Mengel 10/24/2023, 8:20 AM

## 2023-10-24 NOTE — Progress Notes (Signed)
 Physical Therapy Treatment Patient Details Name: [name] MRN: [id number] DOB: October 02, 1975 Today's Date: 10/24/2023   History of Present Illness Pt is 48 yo female s/p L TKA on 10/23/23.  Pt with hx including but is not limited to: R TKA, R LE DVT, B PE, granulomatous adenopathy, HIV, and R ankle fx.    PT Comments  POD # 1 am session AxO x 3 pleasant and knowledgable from prior TKR. Pt self able to get OOB and amb a great distance in hallway.  General stair comments: Pt has a curb step performed up backward with walker <25% VC's on proper tech/safety.  Practiced twice. Then returned to room to perform some TE's following HEP handout.  Instructed on proper tech, freq as well as use of ICE.   Addressed all mobility questions, discussed appropriate activity, educated on use of ICE.  Pt ready for D/C to home.    If plan is discharge home, recommend the following: A little help with walking and/or transfers;A little help with bathing/dressing/bathroom;Assistance with cooking/housework;Help with stairs or ramp for entrance   Can travel by private vehicle        Equipment Recommendations  None recommended by PT    Recommendations for Other Services       Precautions / Restrictions Precautions Precautions: Fall;Knee Precaution/Restrictions Comments: no pillow under Restrictions Weight Bearing Restrictions Per Provider Order: No LLE Weight Bearing Per Provider Order: Weight bearing as tolerated     Mobility  Bed Mobility Overal bed mobility: Modified Independent             General bed mobility comments: self able using her strap L LE    Transfers Overall transfer level: Needs assistance Equipment used: Rolling walker (2 wheels) Transfers: Sit to/from Stand Sit to Stand: Supervision           General transfer comment: self able and good safety cognition    Ambulation/Gait Ambulation/Gait assistance: Supervision Gait Distance (Feet): 85 Feet Assistive device:  Rolling walker (2 wheels) Gait Pattern/deviations: Step-to pattern, Decreased stride length, Decreased weight shift to left Gait velocity: decreased     General Gait Details: tolerated a functional distance with good use of walker and no VC's correction.   Stairs Stairs: Yes Stairs assistance: Supervision, Contact guard assist Stair Management: No rails, Step to pattern, Backwards, With walker Number of Stairs: 1 General stair comments: Pt has a curb step performed up backward with walker <25% VC's on proper tech/safety.  Practiced twice.   Wheelchair Mobility     Tilt Bed    Modified Rankin (Stroke Patients Only)       Balance                                            Communication    Cognition Arousal: Alert Behavior During Therapy: WFL for tasks assessed/performed   PT - Cognitive impairments: No apparent impairments                       PT - Cognition Comments: AxO x 3 pleasant and knowledgable from prior TKR. Following commands: Intact      Cueing    Exercises  Total Knee Replacement TE's following HEP handout 10 reps B LE ankle pumps 05 reps towel squeezes 05 reps knee presses 05 reps heel slides  05 reps SAQ's 05 reps SLR's 05 reps  ABD Educated on use of gait belt to assist with TE's Followed by ICE     General Comments        Pertinent Vitals/Pain Pain Assessment Pain Assessment: 0-10 Pain Score: 4  Pain Location: L knee Pain Descriptors / Indicators: Operative site guarding, Grimacing, Tightness Pain Intervention(s): Monitored during session, Premedicated before session, Repositioned, Ice applied    Home Living                          Prior Function            PT Goals (current goals can now be found in the care plan section) Progress towards PT goals: Progressing toward goals    Frequency    7X/week      PT Plan      Co-evaluation              AM-PAC PT 6 Clicks  Mobility   Outcome Measure  Help needed turning from your back to your side while in a flat bed without using bedrails?: A Little Help needed moving from lying on your back to sitting on the side of a flat bed without using bedrails?: A Little Help needed moving to and from a bed to a chair (including a wheelchair)?: A Little Help needed standing up from a chair using your arms (e.g., wheelchair or bedside chair)?: A Little Help needed to walk in hospital room?: A Little Help needed climbing 3-5 steps with a railing? : A Little 6 Click Score: 18    End of Session Equipment Utilized During Treatment: Gait belt Activity Tolerance: Patient tolerated treatment well Patient left: in chair;with call bell/phone within reach Nurse Communication: Mobility status PT Visit Diagnosis: Other abnormalities of gait and mobility (R26.89);Muscle weakness (generalized) (M62.81)     Time: 8947-8879 PT Time Calculation (min) (ACUTE ONLY): 28 min  Charges:    $Gait Training: 8-22 mins $Therapeutic Exercise: 8-22 mins PT General Charges $$ ACUTE PT VISIT: 1 Visit                     Katheryn Leap  PTA Acute  Rehabilitation Services Office M-F          [phone]

## 2023-10-25 ENCOUNTER — Other Ambulatory Visit: Payer: Self-pay

## 2023-10-29 ENCOUNTER — Other Ambulatory Visit (HOSPITAL_COMMUNITY): Payer: Self-pay

## 2023-11-01 ENCOUNTER — Emergency Department (HOSPITAL_COMMUNITY)

## 2023-11-01 ENCOUNTER — Emergency Department (HOSPITAL_COMMUNITY)
Admission: EM | Admit: 2023-11-01 | Discharge: 2023-11-01 | Disposition: A | Discharge status: Home / Self Care | Attending: Emergency Medicine | Admitting: Emergency Medicine

## 2023-11-01 DIAGNOSIS — M7989 Other specified soft tissue disorders: Secondary | ICD-10-CM | POA: Insufficient documentation

## 2023-11-01 DIAGNOSIS — Z96652 Presence of left artificial knee joint: Secondary | ICD-10-CM | POA: Insufficient documentation

## 2023-11-01 DIAGNOSIS — R0789 Other chest pain: Secondary | ICD-10-CM | POA: Insufficient documentation

## 2023-11-01 DIAGNOSIS — R911 Solitary pulmonary nodule: Secondary | ICD-10-CM | POA: Insufficient documentation

## 2023-11-01 LAB — CBC
HCT: 38.4 % (ref 36.0–46.0)
Hemoglobin: 12.4 g/dL (ref 12.0–15.0)
MCH: 30 pg (ref 26.0–34.0)
MCHC: 32.3 g/dL (ref 30.0–36.0)
MCV: 92.8 fL (ref 80.0–100.0)
Platelets: 330 K/uL (ref 150–400)
RBC: 4.14 MIL/uL (ref 3.87–5.11)
RDW: 13.2 % (ref 11.5–15.5)
WBC: 10.1 K/uL (ref 4.0–10.5)
nRBC: 0 % (ref 0.0–0.2)

## 2023-11-01 LAB — BASIC METABOLIC PANEL WITH GFR
Anion gap: 9 (ref 5–15)
BUN: 18 mg/dL (ref 6–20)
CO2: 26 mmol/L (ref 22–32)
Calcium: 9.3 mg/dL (ref 8.9–10.3)
Chloride: 101 mmol/L (ref 98–111)
Creatinine, Ser: 0.84 mg/dL (ref 0.44–1.00)
GFR, Estimated: >60 mL/min (ref >60)
Glucose, Bld: 109 mg/dL — ABNORMAL HIGH (ref 70–99)
Potassium: 4.4 mmol/L (ref 3.5–5.1)
Sodium: 136 mmol/L (ref 135–145)

## 2023-11-01 LAB — TROPONIN I (HIGH SENSITIVITY): Troponin I (High Sensitivity): <2 ng/L (ref <18)

## 2023-11-01 MED ORDER — IOHEXOL 350 MG/ML SOLN
75.0000 mL | Freq: Once | INTRAVENOUS | Status: AC | PRN
  Administered 2023-11-01: 75 mL via INTRAVENOUS

## 2023-11-01 NOTE — ED Provider Notes (Signed)
 [place] EMERGENCY DEPARTMENT AT [facility] Provider Note   CSN: [id number] Arrival date & time: 11/01/23  1554     Patient presents with: Chest Pain and Knee Pain   [name] is a 48 y.o. female.   Patient here with chest pain shortness of breath for the last day or 2.  Just had recent left knee replacement.  She is on Lovenox  shots.  She has a history of blood clots.  She has been compliant with her medications.  She denies any pain in her surgical site.  She has got some bruising over the anterior surgical site but denies any fever or drainage.  She does not have any active symptoms now.  She was sent here to rule out blood clots.  The history is provided by the patient.       Prior to Admission medications   Medication Sig Start Date End Date Taking? Authorizing Provider  azelastine  (ASTELIN ) 0.1 % nasal spray Place 2 sprays into both nostrils 2 (two) times daily. Use in each nostril as directed 07/24/23   Tobie Eldora NOVAK, MD  cetirizine (ZYRTEC) 10 MG tablet Take 10 mg by mouth daily as needed for allergies.    [provider]  darunavir  (PREZISTA ) 800 MG tablet Take 1 tablet (800 mg total) by mouth daily. 05/24/23   Efrain Lamar ORN, MD  elvitegravir-cobicistat-emtricitabine -tenofovir  (GENVOYA ) 150-150-200-10 MG TABS tablet Take 1 tablet by mouth daily. 05/24/23   Efrain Lamar ORN, MD  enoxaparin  (LOVENOX ) 30 MG/0.3ML injection Inject 0.3 mLs (30 mg total) into the skin every 12 (twelve) hours. 10/24/23   Nida, Andrew, PA-C  hydrochlorothiazide  (MICROZIDE ) 12.5 MG capsule Take 1 capsule (12.5 mg total) by mouth daily. 11/13/17   Briana Elgin LABOR, MD  HYDROcodone -acetaminophen  (NORCO/VICODIN) 5-325 MG tablet Take 1-2 tablets by mouth every 6 (six) hours as needed for moderate pain (pain score 4-6) or severe pain (pain score 7-10) (post op pain). 10/24/23   Nida, Andrew, PA-C  tiZANidine  (ZANAFLEX ) 4 MG tablet Take 1 tablet (4 mg total) by mouth every 6 (six) hours  as needed for muscle spasms. 10/24/23 10/23/24  Lenis Barter, PA-C  Vitamin D, Ergocalciferol, 50000 units CAPS Take 1 capsule by mouth once a week. 03/19/23   [provider]    Allergies: Patient has no known allergies.    Review of Systems  Updated Vital Signs BP (!) 108/92   Pulse 81   Temp 98.4 F (36.9 C) (Oral)   Resp 18   LMP 02/28/2019 (Approximate)   SpO2 94%   Physical Exam Vitals and nursing note reviewed.  Constitutional:      General: She is not in acute distress.    Appearance: She is well-developed. She is not ill-appearing.  HENT:     Head: Normocephalic and atraumatic.  Eyes:     Extraocular Movements: Extraocular movements intact.     Conjunctiva/sclera: Conjunctivae normal.     Pupils: Pupils are equal, round, and reactive to light.  Cardiovascular:     Rate and Rhythm: Normal rate and regular rhythm.     Pulses:          Radial pulses are 2+ on the right side and 2+ on the left side.     Heart sounds: No murmur heard. Pulmonary:     Effort: Pulmonary effort is normal. No respiratory distress.     Breath sounds: Normal breath sounds.  Abdominal:     Palpations: Abdomen is soft.  Tenderness: There is no abdominal tenderness.  Musculoskeletal:        General: No swelling. Normal range of motion.     Cervical back: Normal range of motion and neck supple.     Comments: Surgical site in the left knee is clean dry and intact.  There is a little bit of bruising anteriorly but there is no major swelling when compared to the right otherwise  Skin:    General: Skin is warm and dry.     Capillary Refill: Capillary refill takes less than 2 seconds.  Neurological:     Mental Status: She is alert.  Psychiatric:        Mood and Affect: Mood normal.     (all labs ordered are listed, but only abnormal results are displayed) Labs Reviewed  BASIC METABOLIC PANEL WITH GFR - Abnormal; Notable for the following components:      Result Value   Glucose, Bld  109 (*)    All other components within normal limits  CBC  TROPONIN I (HIGH SENSITIVITY)    EKG: EKG Interpretation Date/Time:  Thursday November 01 2023 16:02:21 EDT Ventricular Rate:  94 PR Interval:  123 QRS Duration:  89 QT Interval:  348 QTC Calculation: 436 R Axis:   41  Text Interpretation: Sinus rhythm Confirmed by Ruthe Cornet [phone]) on 11/01/2023 6:07:11 PM  Radiology: CT Angio Chest PE W and/or Wo Contrast Result Date: 11/01/2023 CLINICAL DATA:  PE suspected chest pain, recent knee replacement * Tracking Code: BO * EXAM: CT ANGIOGRAPHY CHEST WITH CONTRAST TECHNIQUE: Multidetector CT imaging of the chest was performed using the standard protocol during bolus administration of intravenous contrast. Multiplanar CT image reconstructions and MIPs were obtained to evaluate the vascular anatomy. RADIATION DOSE REDUCTION: This exam was performed according to the departmental dose-optimization program which includes automated exposure control, adjustment of the mA and/or kV according to patient size and/or use of iterative reconstruction technique. CONTRAST:  75mL OMNIPAQUE  IOHEXOL  350 MG/ML SOLN COMPARISON:  08/29/2018 FINDINGS: Cardiovascular: Satisfactory opacification of the pulmonary arteries to the segmental level. No evidence of pulmonary embolism. Normal heart size. No pericardial effusion. 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 Mediastinum/Nodes: No enlarged mediastinal, hilar, or axillary lymph nodes. Benign granulomatous calcified AP window lymph node (series 5, image 61). Thyroid  gland, trachea, and esophagus demonstrate no significant findings. Lungs/Pleura: New rounded nodule in the dependent left lower lobe measuring 1.1 x 1.1 cm (series 13, image 74). Diffuse bilateral bronchial wall thickening. Mild mosaic attenuation of the airspaces. No pleural effusion or pneumothorax. Upper Abdomen: No acute abnormality. Musculoskeletal: No chest  wall abnormality. No acute osseous findings. Review of the MIP images confirms the above findings. IMPRESSION: 1. Negative examination for pulmonary embolism. 2. New rounded nodule in the dependent left lower lobe measuring 1.1 x 1.1 cm. This is nonspecific and may be infectious or inflammatory, however malignancy is not excluded. Consider one of the following in 3 months for both low-risk and high-risk individuals: (a) repeat chest CT, (b) follow-up PET-CT, or (c) tissue sampling. This recommendation follows the consensus statement: Guidelines for Management of Incidental Pulmonary Nodules Detected on CT Images: From the Fleischner Society 2017; Radiology 2017; 284:228-243. 3. Diffuse bilateral bronchial wall thickening and mild mosaic attenuation of the airspaces, consistent with nonspecific infectious or inflammatory bronchitis. Electronically Signed   By: Marolyn JONETTA Jaksch M.D.   On: 11/01/2023 21:21   DG Chest Port 1 View Result Date: 11/01/2023 CLINICAL DATA:  Shortness  of breath EXAM: PORTABLE CHEST 1 VIEW COMPARISON:  Chest x-ray 10/10/2023 FINDINGS: The heart size and mediastinal contours are within normal limits. Both lungs are clear. The visualized skeletal structures are unremarkable. IMPRESSION: No active disease. Electronically Signed   By: Greig Pique M.D.   On: 11/01/2023 17:39   DG Knee Complete 4 Views Left Result Date: 11/01/2023 CLINICAL DATA:  Knee pain EXAM: LEFT KNEE - COMPLETE 4+ VIEW COMPARISON:  None Available. FINDINGS: There is anterior knee soft tissue swelling. Left knee arthroplasty is present in anatomic alignment. No evidence for hardware loosening or acute fracture. IMPRESSION: Anterior knee soft tissue swelling. No acute fracture or dislocation. Electronically Signed   By: Greig Pique M.D.   On: 11/01/2023 17:36     Procedures   Medications Ordered in the ED  iohexol  (OMNIPAQUE ) 350 MG/ML injection 75 mL (75 mLs Intravenous Contrast Given 11/01/23 2100)                                     Medical Decision Making Amount and/or Complexity of Data Reviewed Radiology: ordered.  Risk Prescription drug management.   [name] is here with chest pain shortness of breath.  Recent left knee replacement.  She has a history of blood clots and she has been on Lovenox  shots since surgery.  Has not missed any doses.  She was sent here for blood clot workup by her orthopedic.  Surgical site overall appears well.  It is after hours and I do not have the opportunity to get a DVT scan.  Will get this done tomorrow but have low suspicion for clots otherwise given that she is anticoagulated she does not have any symptoms.  She has normal vitals.  Will get a CT scan of her chest.  She is already had CBC BMP troponin EKG done that per my review and interpretation are unremarkable.  Troponin normal.  EKG shows no evidence of ischemia.  Have no concern for ACS.  No pneumonia seen on chest x-ray.  Will get CT scan to see if there is a blood clot there.  If unremarkable we will have her come back for DVT study.  She is neurovascular neuromuscular intact otherwise.  I do suspect this could be reflux related process or may be MSK related process.  Overall lab work showed no significant leukocytosis or anemia.  CT scan showed no PE.  Did show pulmonary nodule.  Will follow-up with her primary care doctor about this.  She did have some nonspecific inflammation in the lungs but she does not have any major symptoms from that standpoint.  Have no concern for infectious process.  She will come back tomorrow for DVT study.  She will continue her Lovenox .  Discharge.  This chart was dictated using voice recognition software.  Despite best efforts to proofread,  errors can occur which can change the documentation meaning.      Final diagnoses:  Atypical chest pain  Leg swelling  Pulmonary nodule    ED Discharge Orders          Ordered    LE VENOUS        11/01/23 1816                Ruthe Cornet, DO 11/01/23 2139

## 2023-11-01 NOTE — ED Triage Notes (Addendum)
 Patient in today reporting chest pain that started post surgery of total left knee. Lovenox .

## 2023-11-01 NOTE — ED Provider Triage Note (Signed)
 Emergency Medicine Provider Triage Evaluation Note  [name] , a 48 y.o. female  was evaluated in triage.  Pt complains of 2d of CP, SOB and s/p left knee replacement. On Lovenox  not missed dose. Hx of PE/DVT. Dr Dalldorf for ortho.   Review of Systems  Positive: See above Negative: fever  Physical Exam  BP (!) 151/92 (BP Location: Right Arm)   Pulse 91   Temp 98.6 F (37 C) (Oral)   Resp 18   LMP 02/28/2019 (Approximate)   SpO2 98%  Gen:   Awake, no distress   Resp:  Normal effort  MSK:   Moves extremities without difficulty, Left leg swelling and incision over left knee c/d/i Other:    Medical Decision Making  Medically screening exam initiated at 4:23 PM.  Appropriate orders placed.  [name] was informed that the remainder of the evaluation will be completed by another provider, this initial triage assessment does not replace that evaluation, and the importance of remaining in the ED until their evaluation is complete.  Basic labs ordered.    Shermon Warren SAILOR, PA-C 11/01/23 1624

## 2023-11-01 NOTE — Discharge Instructions (Signed)
 Continue your Lovenox .  Return to [facility] tomorrow for your DVT study.  Discussed pulmonary nodule with your primary care doctor.

## 2023-11-02 ENCOUNTER — Ambulatory Visit (HOSPITAL_COMMUNITY)
Admission: RE | Admit: 2023-11-02 | Discharge: 2023-11-02 | Disposition: A | Discharge status: Home / Self Care | Source: Ambulatory Visit | Attending: Emergency Medicine | Admitting: Emergency Medicine

## 2023-11-02 DIAGNOSIS — R0602 Shortness of breath: Secondary | ICD-10-CM | POA: Diagnosis not present

## 2023-11-02 DIAGNOSIS — R079 Chest pain, unspecified: Secondary | ICD-10-CM | POA: Insufficient documentation

## 2023-11-02 DIAGNOSIS — M7989 Other specified soft tissue disorders: Secondary | ICD-10-CM | POA: Diagnosis present

## 2023-11-02 DIAGNOSIS — Z96662 Presence of left artificial ankle joint: Secondary | ICD-10-CM | POA: Insufficient documentation

## 2023-11-02 DIAGNOSIS — Z471 Aftercare following joint replacement surgery: Secondary | ICD-10-CM | POA: Insufficient documentation

## 2023-11-02 DIAGNOSIS — Z7901 Long term (current) use of anticoagulants: Secondary | ICD-10-CM | POA: Diagnosis not present

## 2023-11-02 DIAGNOSIS — M79605 Pain in left leg: Secondary | ICD-10-CM | POA: Diagnosis present

## 2023-11-13 ENCOUNTER — Other Ambulatory Visit: Payer: Self-pay

## 2023-11-13 ENCOUNTER — Other Ambulatory Visit: Payer: No Typology Code available for payment source

## 2023-11-13 DIAGNOSIS — Z79899 Other long term (current) drug therapy: Secondary | ICD-10-CM

## 2023-11-13 DIAGNOSIS — B20 Human immunodeficiency virus [HIV] disease: Secondary | ICD-10-CM

## 2023-11-13 DIAGNOSIS — Z113 Encounter for screening for infections with a predominantly sexual mode of transmission: Secondary | ICD-10-CM

## 2023-11-14 ENCOUNTER — Other Ambulatory Visit: Payer: Self-pay | Admitting: Family Medicine

## 2023-11-14 ENCOUNTER — Other Ambulatory Visit: Payer: Self-pay

## 2023-11-14 ENCOUNTER — Other Ambulatory Visit

## 2023-11-14 ENCOUNTER — Other Ambulatory Visit (HOSPITAL_COMMUNITY)
Admission: RE | Admit: 2023-11-14 | Discharge: 2023-11-14 | Disposition: A | Discharge status: Home / Self Care | Source: Ambulatory Visit | Attending: Infectious Diseases | Admitting: Infectious Diseases

## 2023-11-14 ENCOUNTER — Ambulatory Visit
Admission: RE | Admit: 2023-11-14 | Discharge: 2023-11-14 | Disposition: A | Discharge status: Home / Self Care | Source: Ambulatory Visit | Attending: Family Medicine | Admitting: Family Medicine

## 2023-11-14 DIAGNOSIS — Z113 Encounter for screening for infections with a predominantly sexual mode of transmission: Secondary | ICD-10-CM

## 2023-11-14 DIAGNOSIS — J9601 Acute respiratory failure with hypoxia: Secondary | ICD-10-CM

## 2023-11-14 DIAGNOSIS — Z79899 Other long term (current) drug therapy: Secondary | ICD-10-CM

## 2023-11-14 DIAGNOSIS — B20 Human immunodeficiency virus [HIV] disease: Secondary | ICD-10-CM

## 2023-11-15 LAB — URINE CYTOLOGY ANCILLARY ONLY
Chlamydia: NEGATIVE
Comment: NEGATIVE
Comment: NORMAL
Neisseria Gonorrhea: NEGATIVE

## 2023-11-16 LAB — COMPLETE METABOLIC PANEL WITHOUT GFR
AG Ratio: 1.3 (calc) (ref 1.0–2.5)
ALT: 29 U/L (ref 6–29)
AST: 21 U/L (ref 10–35)
Albumin: 3.9 g/dL (ref 3.6–5.1)
Alkaline phosphatase (APISO): 99 U/L (ref 31–125)
BUN: 15 mg/dL (ref 7–25)
CO2: 31 mmol/L (ref 20–32)
Calcium: 9.5 mg/dL (ref 8.6–10.2)
Chloride: 104 mmol/L (ref 98–110)
Creat: 0.79 mg/dL (ref 0.50–0.99)
Globulin: 2.9 g/dL (ref 1.9–3.7)
Glucose, Bld: 88 mg/dL (ref 65–99)
Potassium: 4.7 mmol/L (ref 3.5–5.3)
Sodium: 139 mmol/L (ref 135–146)
Total Bilirubin: 0.3 mg/dL (ref 0.2–1.2)
Total Protein: 6.8 g/dL (ref 6.1–8.1)

## 2023-11-16 LAB — CBC WITH DIFFERENTIAL/PLATELET
Absolute Lymphocytes: 1589 {cells}/uL (ref 850–3900)
Absolute Monocytes: 418 {cells}/uL (ref 200–950)
Basophils Absolute: 41 {cells}/uL (ref 0–200)
Basophils Relative: 0.7 %
Eosinophils Absolute: 348 {cells}/uL (ref 15–500)
Eosinophils Relative: 6 %
HCT: 38.7 % (ref 35.0–45.0)
Hemoglobin: 12.4 g/dL (ref 11.7–15.5)
MCH: 30.2 pg (ref 27.0–33.0)
MCHC: 32 g/dL (ref 32.0–36.0)
MCV: 94.4 fL (ref 80.0–100.0)
MPV: 9.6 fL (ref 7.5–12.5)
Monocytes Relative: 7.2 %
Neutro Abs: 3405 {cells}/uL (ref 1500–7800)
Neutrophils Relative %: 58.7 %
Platelets: 355 Thousand/uL (ref 140–400)
RBC: 4.1 Million/uL (ref 3.80–5.10)
RDW: 13 % (ref 11.0–15.0)
Total Lymphocyte: 27.4 %
WBC: 5.8 Thousand/uL (ref 3.8–10.8)

## 2023-11-16 LAB — LIPID PANEL
Cholesterol: 204 mg/dL — ABNORMAL HIGH (ref <200)
HDL: 49 mg/dL — ABNORMAL LOW (ref >=50)
LDL Cholesterol (Calc): 137 mg/dL — ABNORMAL HIGH
Non-HDL Cholesterol (Calc): 155 mg/dL — ABNORMAL HIGH (ref <130)
Total CHOL/HDL Ratio: 4.2 (calc) (ref <5.0)
Triglycerides: 83 mg/dL (ref <150)

## 2023-11-16 LAB — T-HELPER CELL (CD4) - (RCID CLINIC ONLY)
CD4 % Helper T Cell: 36 % (ref 33–65)
CD4 T Cell Abs: 525 /uL (ref 400–1790)

## 2023-11-16 LAB — RPR: RPR Ser Ql: NONREACTIVE

## 2023-11-16 LAB — HIV-1 RNA QUANT-NO REFLEX-BLD
HIV 1 RNA Quant: NOT DETECTED {copies}/mL
HIV-1 RNA Quant, Log: NOT DETECTED {Log_copies}/mL

## 2023-11-27 ENCOUNTER — Encounter: Payer: Self-pay | Admitting: Infectious Diseases

## 2023-11-27 ENCOUNTER — Ambulatory Visit: Admitting: Infectious Diseases

## 2023-11-27 ENCOUNTER — Telehealth: Payer: Self-pay | Admitting: Internal Medicine

## 2023-11-27 ENCOUNTER — Other Ambulatory Visit: Payer: Self-pay

## 2023-11-27 VITALS — BP 108/76 | HR 81 | Temp 97.8°F | Ht 70.0 in | Wt 263.0 lb

## 2023-11-27 DIAGNOSIS — Z79899 Other long term (current) drug therapy: Secondary | ICD-10-CM | POA: Diagnosis not present

## 2023-11-27 DIAGNOSIS — B20 Human immunodeficiency virus [HIV] disease: Secondary | ICD-10-CM | POA: Diagnosis not present

## 2023-11-27 DIAGNOSIS — E785 Hyperlipidemia, unspecified: Secondary | ICD-10-CM | POA: Diagnosis not present

## 2023-11-27 DIAGNOSIS — Z113 Encounter for screening for infections with a predominantly sexual mode of transmission: Secondary | ICD-10-CM

## 2023-11-27 DIAGNOSIS — Z0000 Encounter for general adult medical examination without abnormal findings: Secondary | ICD-10-CM | POA: Insufficient documentation

## 2023-11-27 NOTE — Progress Notes (Addendum)
 [address], [place], [place], [address]                                                                  Phn. [phone]; Fax: [phone]                                                                             Date: 11/27/23  Reason for Visit: Routine HIV care.    HPI: [name] is a 48 y.o.old female with a history of HTN, h/o MAC/lymphadenopathy s/p tx, Candida esophagitis, DVT/PE on AC, OA s/p B/l knee arthroplasty, HIV who is here for regular fu. Patient previously followed by Dr Efrain and was last seen Feb 2025.  Interval hx/current visit: Reports compliance with Genvoya  and Prezista  without missed doses or any concerns. She reports needing to establish care with another PCP as her PCP has moved.  Discussed about colon cancer, breast cancer and cervical cancer screening. She follows with dentist.  She reports following healthy diet and but not able to exercise due to her recent left knee replacement. No complaints.   ROS: As stated in above HPI; all other systems were reviewed and are otherwise negative unless noted below  No reported fever / chills, night sweats, unintentional weight loss, acute visual change, odynophagia, chest pain/pressure, new or worsened SOB or WOB, nausea, vomiting, diarrhea, dysuria, GU discharge, syncope, seizures, red/hot swollen joints, hallucinations / delusions, rashes, new allergies, unusual / excessive bleeding, swollen lymph nodes, or new hospitalizations/ED visits/Urgent Care visits since the pt was last seen.  PMH/ PSH/ FamHx / Social Hx , medications and allergies reviewed and updated as appropriate; please see corresponding tab in EHR / prior notes                                        Current Outpatient Medications on File Prior to Visit   Medication Sig Dispense Refill   azelastine  (ASTELIN ) 0.1 % nasal spray Place 2 sprays into both nostrils 2 (two) times daily. Use in each nostril as directed 30 mL 12   cetirizine (ZYRTEC) 10 MG tablet Take 10 mg by mouth daily as needed for allergies.     darunavir  (PREZISTA ) 800 MG tablet Take 1 tablet (800 mg total) by mouth daily. 30 tablet 11   elvitegravir-cobicistat-emtricitabine -tenofovir  (GENVOYA ) 150-150-200-10 MG TABS tablet Take 1 tablet by mouth daily. 30 tablet 11   enoxaparin  (LOVENOX ) 30 MG/0.3ML injection Inject 0.3 mLs (30 mg total) into the skin every 12 (twelve) hours. 18 mL 0  hydrochlorothiazide  (MICROZIDE ) 12.5 MG capsule Take 1 capsule (12.5 mg total) by mouth daily. 30 capsule 0   HYDROcodone -acetaminophen  (NORCO/VICODIN) 5-325 MG tablet Take 1-2 tablets by mouth every 6 (six) hours as needed for moderate pain (pain score 4-6) or severe pain (pain score 7-10) (post op pain). 40 tablet 0   tiZANidine  (ZANAFLEX ) 4 MG tablet Take 1 tablet (4 mg total) by mouth every 6 (six) hours as needed for muscle spasms. 40 tablet 1   Vitamin D, Ergocalciferol, 50000 units CAPS Take 1 capsule by mouth once a week.     No current facility-administered medications on file prior to visit.    No Known Allergies  Past Medical History:  Diagnosis Date   Anxiety    Arthritis    Candidal esophagitis (HCC)    Complication of anesthesia 07/2011   in PACU took long time for oxygen sts to come up,2008 tonsillectomy went to ICU   Deep vein blood clot of right lower extremity (HCC)    Family history of adverse reaction to anesthesia    mother H/O n/v   Fracture of right ankle, lateral malleolus    HIV (human immunodeficiency virus infection) (HCC)    Hypertension    MAC (mycobacterium avium-intracellulare complex)    positive culture, treated for 3 months   Pulmonary embolism [facility])     Past Surgical History:  Procedure Laterality Date   ABDOMINAL HYSTERECTOMY  03/18/2019    ABDOMINAL HYSTERECTOMY N/A 03/18/2019   Procedure: HYSTERECTOMY ABDOMINAL WITH BILATERAL SALPINGECTOMY;  Surgeon: Sarrah Browning, MD;  Location: [facility] OR;  Service: Gynecology;  Laterality: N/A;   ANKLE ARTHROSCOPY WITH OPEN REDUCTION INTERNAL FIXATION (ORIF) Right 10/31/2017   ANTERIOR CRUCIATE LIGAMENT REPAIR     Right and left repair   bilateral rotator cuff repari      LYMPH NODE BIOPSY Left 11/14/2016   Procedure: Left Cervical node I/D (incision and drain) and Biopsy;  Surgeon: Army Dallas NOVAK, MD;  Location: [facility] OR;  Service: Thoracic;  Laterality: Left;   NASAL RECONSTRUCTION  1982   ORIF ANKLE FRACTURE Right 11/01/2017   Procedure: OPEN REDUCTION INTERNAL FIXATION (ORIF) right lateral malleolus fracture;  Surgeon: Kit Rush, MD;  Location: [facility];  Service: Orthopedics;  Laterality: Right;   SUPRACLAVICAL NODE BIOPSY  07/28/2011   Procedure: SUPRACLAVICAL NODE BIOPSY;  Surgeon: Nancyann SHAUNNA Nam, MD;  Location: [facility] OR;  Service: Thoracic;  Laterality: Left;   TONSILLECTOMY     TOTAL KNEE ARTHROPLASTY Right 10/10/2022   Procedure: RIGHT TOTAL KNEE ARTHROPLASTY;  Surgeon: Sheril Coy, MD;  Location: [facility] ORS;  Service: Orthopedics;  Laterality: Right;   TOTAL KNEE ARTHROPLASTY Left 10/23/2023   Procedure: ARTHROPLASTY, KNEE, TOTAL;  Surgeon: Sheril Coy, MD;  Location: [facility] ORS;  Service: Orthopedics;  Laterality: Left;   Social History   Socioeconomic History   Marital status: Single    Spouse name: Not on file   Number of children: Not on file   Years of education: Not on file   Highest education level: Not on file  Occupational History   Not on file  Tobacco Use   Smoking status: Never    Passive exposure: Never   Smokeless tobacco: Never  Vaping Use   Vaping status: Never Used  Substance and Sexual Activity   Alcohol use: Yes    Comment: socially   Drug use: No   Sexual activity: Not Currently    Partners: Female    Birth control/protection:  None  Comment: declined condoms  Other Topics Concern   Not on file  Social History Narrative   Not on file   Social Drivers of Health   Financial Resource Strain: Not on file  Food Insecurity: No Food Insecurity (10/23/2023)   Hunger Vital Sign    Worried About Running Out of Food in the Last Year: Never true    Ran Out of Food in the Last Year: Never true  Transportation Needs: No Transportation Needs (10/23/2023)   PRAPARE - Administrator, Civil Service (Medical): No    Lack of Transportation (Non-Medical): No  Physical Activity: Not on file  Stress: Not on file  Social Connections: Unknown (08/29/2021)   [facility] from [facility]   Social Network    Social Network: Not on file  Intimate Partner Violence: Not At Risk (10/23/2023)   Humiliation, Afraid, Rape, and Kick questionnaire    Fear of Current or Ex-Partner: No    Emotionally Abused: No    Physically Abused: No    Sexually Abused: No      LMP 02/28/2019 (Approximate)    Examination  Gen: no acute distress HEENT: [place]/AT, no scleral icterus, no pale conjunctivae, hearing normal, oral mucosa moist Neck: Supple Cardio: Regular rate and rhythm Resp: Pulmonary effort normal in room air GI: nondistended GU: Musc: Extremities: No cyanosis, clubbing, or edema Skin: No rashes, lesions, or ecchymoses Neuro: grossly non focal , awake, alert and oriented * 3  Psych: Calm, cooperative    Lab Results HIV 1 RNA Quant  Date Value  11/14/2023 NOT DETECTED copies/mL  04/26/2023 <20 Copies/mL (H)  11/01/2022 Not Detected Copies/mL   CD4 T Cell Abs (/uL)  Date Value  11/14/2023 525  11/01/2022 810  03/20/2022 524   Lab Results  Component Value Date   HIV1GENOSEQ  RT, Seq^REPORT 05/01/2013   Lab Results  Component Value Date   WBC 5.8 11/14/2023   HGB 12.4 11/14/2023   HCT 38.7 11/14/2023   MCV 94.4 11/14/2023   PLT 355 11/14/2023    Lab Results  Component Value Date   CREATININE 0.79  11/14/2023   BUN 15 11/14/2023   NA 139 11/14/2023   K 4.7 11/14/2023   CL 104 11/14/2023   CO2 31 11/14/2023   Lab Results  Component Value Date   ALT 29 11/14/2023   AST 21 11/14/2023   ALKPHOS 66 03/18/2019   BILITOT 0.3 11/14/2023    Lab Results  Component Value Date   CHOL 204 (H) 11/14/2023   TRIG 83 11/14/2023   HDL 49 (L) 11/14/2023   LDLCALC 137 (H) 11/14/2023   Lab Results  Component Value Date   HAV NEG 12/29/2010   Lab Results  Component Value Date   HEPBSAG NEGATIVE 12/29/2010   HEPBSAB POS (A) 12/29/2010   Lab Results  Component Value Date   HCVAB NEGATIVE 12/29/2010   Lab Results  Component Value Date   CHLAMYDIAWP Negative 11/14/2023   N Negative 11/14/2023   No results found for: GCPROBEAPT No results found for: QUANTGOLD    Health Maintenance: Immunization History  Administered Date(s) Administered   Influenza Split 12/29/2010, 03/01/2012   Influenza,inj,Quad PF,6+ Mos 01/16/2013, 01/07/2014, 01/05/2015, 03/05/2015, 01/25/2017, 12/16/2017, 03/06/2019   Influenza-Unspecified 01/16/2016, 01/16/2019, 01/16/2020, 01/07/2021, 12/30/2021, 01/16/2023   Meningococcal Mcv4o 07/06/2016, 06/04/2018   PFIZER(Purple Top)SARS-COV-2 Vaccination 07/02/2019, 07/24/2019   PPD Test 04/19/2016   Pfizer(Comirnaty)Fall Seasonal Vaccine 12 years and older 01/09/2023   Pneumococcal Conjugate-13 12/13/2017   Pneumococcal Polysaccharide-23 12/29/2010,  03/06/2019   Tdap 01/08/2020       Assessment/Plan: # HIV - continue Genvoya  and Prezista , refill sent - 7/30 labs reviewed and discussed, undetectable HIV viral load with CD4 525, normal creatinine  - Follow-up in 5 to 6 months  # STD screening - Urine GC and RPR negative  # Hyperlipidemia  - Discussed about healthy diet and exercise as possible with her knee replacement.  - Discussion about potentially need for adding statin. She will fu with PCP  # Immunization  - deferred   #Health  maintenance - Follows with dentist - Discussed about colon cancer, breast cancer and cervical cancer screening.  She will follow-up with her PCP.  Patient's labs were reviewed as well as his previous records. Patients questions were addressed and answered. Safe sex counseling done.   I spent 36 minutes involved in face-to-face and non-face-to-face activities for this patient on the day of the visit. Professional time spent includes the following activities: Preparing to see the patient (review of tests), Obtaining and reviewing separately obtained history (ED 7/17, discharge note 7/9,  Dr Efrain Notes), Performing a medically appropriate examination and evaluation, ordering labs, Documenting clinical information in the EMR, Independently interpreting results (not separately reported), Communicating results to the patient, Counseling and educating the patient and Care coordination (not separately reported).   Of note, portions of this note may have been created with voice recognition software. While this note has been edited for accuracy, occasional wrong-word or 'sound-a-like' substitutions may have occurred due to the inherent limitations of voice recognition software.   Electronically signed by:  Annalee Orem, MD Infectious Disease Physician [facility] [address] [place], [place] [address] Phone: [phone]  Fax: [phone]

## 2023-12-26 ENCOUNTER — Ambulatory Visit (INDEPENDENT_AMBULATORY_CARE_PROVIDER_SITE_OTHER): Admitting: Otolaryngology

## 2024-01-16 ENCOUNTER — Other Ambulatory Visit: Payer: Self-pay | Admitting: Family Medicine

## 2024-01-16 DIAGNOSIS — R911 Solitary pulmonary nodule: Secondary | ICD-10-CM

## 2024-02-14 ENCOUNTER — Ambulatory Visit
Admission: RE | Admit: 2024-02-14 | Discharge: 2024-02-14 | Disposition: A | Discharge status: Home / Self Care | Source: Ambulatory Visit | Attending: Family Medicine | Admitting: Family Medicine

## 2024-02-14 DIAGNOSIS — R911 Solitary pulmonary nodule: Secondary | ICD-10-CM

## 2024-02-14 MED ORDER — IOPAMIDOL (ISOVUE-300) INJECTION 61%
75.0000 mL | Freq: Once | INTRAVENOUS | Status: AC | PRN
  Administered 2024-02-14: 75 mL via INTRAVENOUS

## 2024-02-20 ENCOUNTER — Emergency Department (HOSPITAL_COMMUNITY)
Admission: EM | Admit: 2024-02-20 | Discharge: 2024-02-21 | Disposition: A | Discharge status: Home / Self Care | Attending: Emergency Medicine | Admitting: Emergency Medicine

## 2024-02-20 ENCOUNTER — Other Ambulatory Visit: Payer: Self-pay

## 2024-02-20 ENCOUNTER — Emergency Department (HOSPITAL_COMMUNITY)

## 2024-02-20 ENCOUNTER — Encounter (HOSPITAL_COMMUNITY): Payer: Self-pay | Admitting: Emergency Medicine

## 2024-02-20 DIAGNOSIS — R0789 Other chest pain: Secondary | ICD-10-CM | POA: Insufficient documentation

## 2024-02-20 DIAGNOSIS — I10 Essential (primary) hypertension: Secondary | ICD-10-CM | POA: Diagnosis not present

## 2024-02-20 DIAGNOSIS — Z21 Asymptomatic human immunodeficiency virus [HIV] infection status: Secondary | ICD-10-CM | POA: Insufficient documentation

## 2024-02-20 DIAGNOSIS — R079 Chest pain, unspecified: Secondary | ICD-10-CM

## 2024-02-20 DIAGNOSIS — Z79899 Other long term (current) drug therapy: Secondary | ICD-10-CM | POA: Diagnosis not present

## 2024-02-20 NOTE — ED Triage Notes (Signed)
 Patient presents with right side [persistent chest pain that began yesterday afternoon. Pain does not radiate. She described it as pressure in nature. Patient denies shortness of breath and nausea.

## 2024-02-21 ENCOUNTER — Emergency Department (HOSPITAL_COMMUNITY)

## 2024-02-21 LAB — BASIC METABOLIC PANEL WITH GFR
Anion gap: 8 (ref 5–15)
BUN: 15 mg/dL (ref 6–20)
CO2: 26 mmol/L (ref 22–32)
Calcium: 9.4 mg/dL (ref 8.9–10.3)
Chloride: 104 mmol/L (ref 98–111)
Creatinine, Ser: 0.81 mg/dL (ref 0.44–1.00)
GFR, Estimated: >60 mL/min (ref >60)
Glucose, Bld: 93 mg/dL (ref 70–99)
Potassium: 4.2 mmol/L (ref 3.5–5.1)
Sodium: 138 mmol/L (ref 135–145)

## 2024-02-21 LAB — CBC
HCT: 42.5 % (ref 36.0–46.0)
Hemoglobin: 13.4 g/dL (ref 12.0–15.0)
MCH: 27.9 pg (ref 26.0–34.0)
MCHC: 31.5 g/dL (ref 30.0–36.0)
MCV: 88.5 fL (ref 80.0–100.0)
Platelets: 301 K/uL (ref 150–400)
RBC: 4.8 MIL/uL (ref 3.87–5.11)
RDW: 14.2 % (ref 11.5–15.5)
WBC: 9.3 K/uL (ref 4.0–10.5)
nRBC: 0 % (ref 0.0–0.2)

## 2024-02-21 LAB — TROPONIN T, HIGH SENSITIVITY
Troponin T High Sensitivity: <15 ng/L (ref 0–19)
Troponin T High Sensitivity: <15 ng/L (ref 0–19)

## 2024-02-21 LAB — D-DIMER, QUANTITATIVE: D-Dimer, Quant: 1.11 ug{FEU}/mL — ABNORMAL HIGH (ref 0.00–0.50)

## 2024-02-21 MED ORDER — IOHEXOL 350 MG/ML SOLN
75.0000 mL | Freq: Once | INTRAVENOUS | Status: AC | PRN
  Administered 2024-02-21: 75 mL via INTRAVENOUS

## 2024-02-21 MED ORDER — MORPHINE SULFATE (PF) 2 MG/ML IV SOLN
2.0000 mg | Freq: Once | INTRAVENOUS | Status: AC
  Administered 2024-02-21: 2 mg via INTRAVENOUS
  Filled 2024-02-21: qty 1

## 2024-02-21 MED ORDER — ONDANSETRON HCL 4 MG/2ML IJ SOLN
4.0000 mg | Freq: Once | INTRAMUSCULAR | Status: AC
  Administered 2024-02-21: 4 mg via INTRAVENOUS
  Filled 2024-02-21: qty 2

## 2024-02-21 NOTE — ED Provider Notes (Addendum)
 [facility] AT [facility] Provider Note   CSN: [id number] Arrival date & time: 02/20/24  2242     Patient presents with: Chest Pain   [name] is a 48 y.o. female.  Past medical history significant for HIV, PE, hypertension presents today for right sided chest pain that began yesterday afternoon.  Patient describes the pain as pressure.  Patient denies shortness of breath, nausea, vomiting, dizziness, abdominal pain, fever, chills, cough, congestion, any other complaints at this time.  Patient is most recent HIV labs were undetectable.  Patient reports compliance with medications.  Patient has a past medical history significant for DVT and PE not currently anticoagulated.    Chest Pain      Prior to Admission medications   Medication Sig Start Date End Date Taking? Authorizing Provider  azelastine  (ASTELIN ) 0.1 % nasal spray Place 2 sprays into both nostrils 2 (two) times daily. Use in each nostril as directed 07/24/23   Tobie Eldora NOVAK, MD  cetirizine (ZYRTEC) 10 MG tablet Take 10 mg by mouth daily as needed for allergies.    [provider]  darunavir  (PREZISTA ) 800 MG tablet Take 1 tablet (800 mg total) by mouth daily. 05/24/23   Efrain Lamar ORN, MD  elvitegravir-cobicistat-emtricitabine -tenofovir  (GENVOYA ) 150-150-200-10 MG TABS tablet Take 1 tablet by mouth daily. 05/24/23   Efrain Lamar ORN, MD  enoxaparin  (LOVENOX ) 30 MG/0.3ML injection Inject 0.3 mLs (30 mg total) into the skin every 12 (twelve) hours. 10/24/23   Nida, Andrew, PA-C  hydrochlorothiazide  (MICROZIDE ) 12.5 MG capsule Take 1 capsule (12.5 mg total) by mouth daily. 11/13/17   Briana Elgin LABOR, MD  HYDROcodone -acetaminophen  (NORCO/VICODIN) 5-325 MG tablet Take 1-2 tablets by mouth every 6 (six) hours as needed for moderate pain (pain score 4-6) or severe pain (pain score 7-10) (post op pain). 10/24/23   Lenis Barter, PA-C  tiZANidine  (ZANAFLEX ) 4 MG tablet Take 1 tablet (4 mg total)  by mouth every 6 (six) hours as needed for muscle spasms. 10/24/23 10/23/24  Lenis Barter, PA-C  Vitamin D, Ergocalciferol, 50000 units CAPS Take 1 capsule by mouth once a week. 03/19/23   [provider]    Allergies: Patient has no known allergies.    Review of Systems  Cardiovascular:  Positive for chest pain.    Updated Vital Signs BP 133/79 (BP Location: Left Arm)   Pulse 68   Temp 98.4 F (36.9 C)   Resp 18   LMP 02/28/2019 (Approximate)   SpO2 98%   Physical Exam Vitals and nursing note reviewed.  Constitutional:      General: She is not in acute distress.    Appearance: She is well-developed.  HENT:     Head: Normocephalic and atraumatic.  Eyes:     Conjunctiva/sclera: Conjunctivae normal.  Cardiovascular:     Rate and Rhythm: Normal rate and regular rhythm.     Heart sounds: No murmur heard. Pulmonary:     Effort: Pulmonary effort is normal. No respiratory distress.     Breath sounds: Normal breath sounds.  Abdominal:     Palpations: Abdomen is soft.     Tenderness: There is no abdominal tenderness.  Musculoskeletal:        General: No swelling.     Cervical back: Neck supple.  Skin:    General: Skin is warm and dry.     Capillary Refill: Capillary refill takes less than 2 seconds.  Neurological:     Mental Status: She is alert.  Psychiatric:        Mood and Affect: Mood normal.     (all labs ordered are listed, but only abnormal results are displayed) Labs Reviewed  D-DIMER, QUANTITATIVE - Abnormal; Notable for the following components:      Result Value   D-Dimer, Quant 1.11 (*)    All other components within normal limits  BASIC METABOLIC PANEL WITH GFR  CBC  TROPONIN T, HIGH SENSITIVITY  TROPONIN T, HIGH SENSITIVITY    EKG: None  Radiology: CT Angio Chest PE W and/or Wo Contrast Result Date: 02/21/2024 EXAM: CTA CHEST 02/21/2024 06:29:44 AM TECHNIQUE: CTA of the chest was performed after the administration of 75 mL iohexol   (OMNIPAQUE ) 350 MG/ML injection. Multiplanar reformatted images are provided for review. MIP images are provided for review. Automated exposure control, iterative reconstruction, and/or weight based adjustment of the mA/kV was utilized to reduce the radiation dose to as low as reasonably achievable. COMPARISON: Chest radiographs yesterday, CTA chest 11/01/2023, follow up chest CT 02/14/2024. CLINICAL HISTORY: 48 year old female with suspected pulmonary embolism, low to intermediate probability, and positive D-dimer. FINDINGS: PULMONARY ARTERIES: Suboptimal but adequate pulmonary artery contrast timing. Respiratory motion artifact at the lung bases obscures detail of the subsegmental middle and lower lobe arteries. No obvious calcified atherosclerosis. No acute pulmonary embolus. Main pulmonary artery is normal in caliber. MEDIASTINUM: The heart and pericardium demonstrate no acute abnormality. No pericardial effusion. There is no acute abnormality of the thoracic aorta. Aberrant origin of the right subclavian artery, normal variant. LYMPH NODES: No mediastinal, hilar or axillary lymphadenopathy. LUNGS AND PLEURA: Solid posterior basal segment left lower lobe pulmonary nodule persists, left costophrenic angle, up to 11 mm in diameter and indeterminate. See recommendations on recent follow up chest CT 02/14/2024. Lower lung volumes compared to CT last month. Major airways remain patent. No new pulmonary opacity. No evidence of pleural effusion or pneumothorax. UPPER ABDOMEN: Diverticulosis of the transverse colon and splenic flexure otherwise negative visible mostly non-contrast upper abdominal viscera. SOFT TISSUES AND BONES: Age advanced thoracic spine endplate degeneration. No acute bone or soft tissue abnormality. IMPRESSION: 1. No acute pulmonary embolism identified, but subsegmental middle and lower lobes artery detail is limited by suboptimal contrast timing and motion artifact. 2. Indeterminate solid pulmonary  nodule in the posterior basal left lower lobe (11 mm). See recommendations on chest CT last month. Electronically signed by: Helayne Hurst MD 02/21/2024 06:43 AM EST RP Workstation: [id number]   DG Chest 2 View Result Date: 02/20/2024 EXAM: 2 VIEW(S) XRAY OF THE CHEST 02/20/2024 11:34:00 PM COMPARISON: Chest x-ray 11/14/2023. CLINICAL HISTORY: pain FINDINGS: LUNGS AND PLEURA: No focal pulmonary opacity. No pulmonary edema. No pleural effusion. No pneumothorax. HEART AND MEDIASTINUM: No acute abnormality of the cardiac and mediastinal silhouettes. BONES AND SOFT TISSUES: No acute osseous abnormality. IMPRESSION: 1. No acute process. Electronically signed by: Greig Pique MD 02/20/2024 11:38 PM EST RP Workstation: [id number]     Procedures   Medications Ordered in the ED  morphine  (PF) 2 MG/ML injection 2 mg (2 mg Intravenous Given 02/21/24 0457)  ondansetron  (ZOFRAN ) injection 4 mg (4 mg Intravenous Given 02/21/24 0456)  iohexol  (OMNIPAQUE ) 350 MG/ML injection 75 mL (75 mLs Intravenous Contrast Given 02/21/24 0619)    Clinical Course as of 02/21/24 0651  Thu Feb 21, 2024  0638 Hx of HIV, undetectable on recent labs. Hx of previous PE / Dimer. In with chest pain, positive dimer. Pending CTA. [CP]  [id number] Agree as presented. [CC]  Clinical Course User Index [CC] Jerral Meth, MD [CP] Rosan Sherlean DEL, PA-C                                 Medical Decision Making Amount and/or Complexity of Data Reviewed Labs: ordered. Radiology: ordered.  Risk Prescription drug management.   This patient presents to the ED for concern of chest pain, this involves an extensive number of treatment options, and is a complaint that carries with it a high risk of complications and morbidity.  The differential diagnosis includes STEMI, NSTEMI, arrhythmia, anemia, electrolyte abnormality, GERD, costochondritis, musculoskeletal pain, hypertensive emergency   Co morbidities / Chronic conditions that  complicate the patient evaluation  HIV, hypertension   Lab Tests:  I Ordered, and personally interpreted labs.  The pertinent results include: BMP unremarkable, CBC unremarkable, delta troponin less than 15, elevated D-dimer at 1.11   Imaging Studies ordered:  I ordered imaging studies including chest x-ray I independently visualized and interpreted imaging which showed no acute process I agree with the radiologist interpretation CT angio PE which showed no acute pulmonary embolism identified, but subsegmental middle and lower lobes artery detail is limited by suboptimal contrast timing and motion artifact.  Indeterminate solid pulmonary nodule in the posterior basal left lower lobe 11 mm.   Cardiac Monitoring: / EKG:  The patient was maintained on a cardiac monitor.  I personally viewed and interpreted the cardiac monitored which showed an underlying rhythm of: Sinus rhythm   Problem List / ED Course / Critical interventions / Medication management I ordered medication including morphine  and Zofran  I have reviewed the patients home medicines and have made adjustments as needed  Considered for admission or further workup however patient's vital signs, physical exam, labs, and imaging are reassuring.  Patient informed of incidental finding of pulmonary nodule and radiologist recommendations.  Patient's pain has improved and patient is advised to alternate Tylenol  Motrin  as needed for pain.  Patient stable for discharge at this time.    Final diagnoses:  Chest pain, unspecified type    ED Discharge Orders     None          Francis Ileana LOISE DEVONNA 02/21/24 [id number]    Jerral Meth, MD 02/21/24 0644    Francis Ileana LOISE, PA-C 02/21/24 9348    Jerral Meth, MD 02/21/24 2300

## 2024-02-21 NOTE — Discharge Instructions (Addendum)
 You were seen for chest pain.  Your workup on the emergency department was reassuring.  You did have an incidental finding of a left pulmonary nodule that will require follow-up from your primary care physician.  You may alternate Tylenol  and Motrin  as needed for your chest pain.  Thank you for letting us  treat you today. After reviewing your labs and imaging, I feel you are safe to go home. Please follow up with your PCP in the next several days and provide them with your records from this visit. Return to the Emergency Room if pain becomes severe or symptoms worsen.

## 2024-03-10 ENCOUNTER — Other Ambulatory Visit (HOSPITAL_COMMUNITY): Payer: Self-pay | Admitting: Family Medicine

## 2024-03-10 DIAGNOSIS — E7800 Pure hypercholesterolemia, unspecified: Secondary | ICD-10-CM

## 2024-03-20 ENCOUNTER — Ambulatory Visit (HOSPITAL_COMMUNITY): Admission: RE | Admit: 2024-03-20 | Discharge: 2024-03-20 | Payer: Self-pay | Attending: Family Medicine

## 2024-03-20 DIAGNOSIS — E7800 Pure hypercholesterolemia, unspecified: Secondary | ICD-10-CM | POA: Insufficient documentation

## 2024-04-01 ENCOUNTER — Other Ambulatory Visit: Payer: Self-pay | Admitting: Pulmonary Disease

## 2024-04-01 ENCOUNTER — Encounter: Payer: Self-pay | Admitting: Pulmonary Disease

## 2024-04-01 DIAGNOSIS — R911 Solitary pulmonary nodule: Secondary | ICD-10-CM

## 2024-04-03 ENCOUNTER — Ambulatory Visit: Admitting: Nurse Practitioner

## 2024-04-03 VITALS — BP 120/82 | HR 84 | Ht 70.0 in | Wt 277.8 lb

## 2024-04-03 DIAGNOSIS — E785 Hyperlipidemia, unspecified: Secondary | ICD-10-CM

## 2024-04-03 DIAGNOSIS — G4733 Obstructive sleep apnea (adult) (pediatric): Secondary | ICD-10-CM | POA: Diagnosis not present

## 2024-04-03 DIAGNOSIS — I2510 Atherosclerotic heart disease of native coronary artery without angina pectoris: Secondary | ICD-10-CM | POA: Diagnosis not present

## 2024-04-03 DIAGNOSIS — R072 Precordial pain: Secondary | ICD-10-CM

## 2024-04-03 DIAGNOSIS — I10 Essential (primary) hypertension: Secondary | ICD-10-CM

## 2024-04-03 DIAGNOSIS — Z86711 Personal history of pulmonary embolism: Secondary | ICD-10-CM

## 2024-04-03 DIAGNOSIS — R931 Abnormal findings on diagnostic imaging of heart and coronary circulation: Secondary | ICD-10-CM

## 2024-04-03 LAB — BASIC METABOLIC PANEL WITH GFR
BUN/Creatinine Ratio: 19 (ref 9–23)
BUN: 14 mg/dL (ref 6–24)
CO2: 23 mmol/L (ref 20–29)
Calcium: 9.6 mg/dL (ref 8.7–10.2)
Chloride: 103 mmol/L (ref 96–106)
Creatinine, Ser: 0.75 mg/dL (ref 0.57–1.00)
Glucose: 89 mg/dL (ref 70–99)
Potassium: 4.8 mmol/L (ref 3.5–5.2)
Sodium: 139 mmol/L (ref 134–144)
eGFR: 99 mL/min/1.73 (ref >59)

## 2024-04-03 MED ORDER — METOPROLOL TARTRATE 100 MG PO TABS: 100.0000 mg | ORAL_TABLET | Freq: Once | ORAL | 0 refills | Status: DC

## 2024-04-03 NOTE — Progress Notes (Unsigned)
 Office Visit    Patient Name: [name] Date of Encounter: 04/03/2024  Primary Care Provider:  Espinoza, Alejandra, DO Primary Cardiologist:  None  Chief Complaint    48 year old female with [name] history of coronary artery calcification, PE, DVT, hypertension, hyperlipidemia,  Mycobacterium avium intracellulare complex, HIV, arthritis, anxiety, pulmonary nodule, and OSA who presents for heart first clinic new patient evaluation.  Past Medical History    Past Medical History:  Diagnosis Date   Anxiety    Arthritis    Candidal esophagitis (HCC)    Complication of anesthesia 07/2011   in PACU took long time for oxygen sts to come up,2008 tonsillectomy went to ICU   Deep vein blood clot of right lower extremity (HCC)    Family history of adverse reaction to anesthesia    mother H/O n/v   Fracture of right ankle, lateral malleolus    HIV (human immunodeficiency virus infection) (HCC)    Hypertension    MAC (mycobacterium avium-intracellulare complex)    positive culture, treated for [name] months   Pulmonary embolism [facility])    Past Surgical History:  Procedure Laterality Date   ABDOMINAL HYSTERECTOMY  03/18/2019   ABDOMINAL HYSTERECTOMY N/[name] 03/18/2019   Procedure: HYSTERECTOMY ABDOMINAL WITH BILATERAL SALPINGECTOMY;  Surgeon: Sarrah Rosaline, MD;  Location: [facility] OR;  Service: Gynecology;  Laterality: N/[name];   ANKLE ARTHROSCOPY WITH OPEN REDUCTION INTERNAL FIXATION (ORIF) Right 10/31/2017   ANTERIOR CRUCIATE LIGAMENT REPAIR     Right and left repair   bilateral rotator cuff repari      LYMPH NODE BIOPSY Left 11/14/2016   Procedure: Left Cervical node I/D (incision and drain) and Biopsy;  Surgeon: Army Dallas NOVAK, MD;  Location[facility] OR;  Service: Thoracic;  Laterality: Left;   NASAL RECONSTRUCTION  1982   ORIF ANKLE FRACTURE Right 11/01/2017   Procedure: OPEN REDUCTION INTERNAL FIXATION (ORIF) right lateral malleolus fracture;  Surgeon: Kit Rush, MD;  Location: [facility];  Service: Orthopedics;  Laterality: Right;   SUPRACLAVICAL NODE BIOPSY  07/28/2011   Procedure: SUPRACLAVICAL NODE BIOPSY;  Surgeon: Nancyann SHAUNNA Nam, MD;  Location: [facility] OR;  Service: Thoracic;  Laterality: Left;   TONSILLECTOMY     TOTAL KNEE ARTHROPLASTY Right 10/10/2022   Procedure: RIGHT TOTAL KNEE ARTHROPLASTY;  Surgeon: Sheril Coy, MD;  Location: [facility] ORS;  Service: Orthopedics;  Laterality: Right;   TOTAL KNEE ARTHROPLASTY Left 10/23/2023   Procedure: ARTHROPLASTY, KNEE, TOTAL;  Surgeon: Sheril Coy, MD;  Location: [facility] ORS;  Service: Orthopedics;  Laterality: Left;    Allergies  Allergies[1]   Labs/Other Studies Reviewed    The following studies were reviewed today:  Cardiac Studies & Procedures   ______________________________________________________________________________________________     ECHOCARDIOGRAM  ECHOCARDIOGRAM COMPLETE 03/04/2018  Narrative *[name]* [address] [place], [place] [address] [phone]  ------------------------------------------------------------------- Transthoracic Echocardiography  Patient:    [name] MR #:       [id number] Study Date: 03/04/2018 Gender:     F Age:        42 Height:     177.8 cm Weight:     137 kg BSA:        2.67 m^2 Pt. Status: Room:  SONOGRAPHER  [place], Will ATTENDING    [name], M.D. PERFORMING   [facility], Outpatient ORDERING     [name] REFERRING    [name]  cc:  ------------------------------------------------------------------- LV EF: 65% -   70%  ------------------------------------------------------------------- Indications:      (  I26.99).  ------------------------------------------------------------------- History:   PMH:  Pulmonary embolism. Acquired from the patient and from the patient&'s chart.  Risk factors:  Obese.  ------------------------------------------------------------------- Study Conclusions  - Left  ventricle: The cavity size was normal. There was moderate concentric hypertrophy. Systolic function was vigorous. The estimated ejection fraction was in the range of 65% to 70%. Wall motion was normal; there were no regional wall motion abnormalities. Left ventricular diastolic function parameters were normal. - Aortic valve: There was trivial regurgitation. - Aortic root: The aortic root was normal in size. - Right ventricle: Systolic function was normal. - Tricuspid valve: There was trivial regurgitation. - Pulmonic valve: There was no regurgitation. - Pulmonary arteries: Systolic pressure was within the normal range. - Inferior vena cava: The vessel was normal in size. - Pericardium, extracardiac: There was no pericardial effusion.  Impressions:  - There is no evidence for [name] right ventricular strain or elevated RVSP.  ------------------------------------------------------------------- Study data:  Comparison was made to the study of 11/10/2017.  Study status:  Routine.  Procedure:  The patient reported no pain pre or post test. Transthoracic echocardiography for left ventricular function evaluation. Image quality was adequate. Transthoracic echocardiography.  M-mode, complete 2D, spectral Doppler, and color Doppler.  Birthdate:  Patient birthdate: 27-Apr-1975.  Age:  Patient is 48 yr old.  Sex:  Gender: female. BMI: 43.[name] kg/m^2.  Blood pressure:     130/82  Patient status: Outpatient.  Study date:  Study date: 03/04/2018. Study time: 09:46 AM.  Location:  [name]  -------------------------------------------------------------------  ------------------------------------------------------------------- Left ventricle:  The cavity size was normal. There was moderate concentric hypertrophy. Systolic function was vigorous. The estimated ejection fraction was in the range of 65% to 70%. Wall motion was normal; there were no regional wall motion abnormalities. The  transmitral flow pattern was normal. The deceleration time of the early transmitral flow velocity was normal. The pulmonary vein flow pattern was normal. The tissue Doppler parameters were normal. Left ventricular diastolic function parameters were normal.  ------------------------------------------------------------------- Aortic valve:   Trileaflet; normal thickness leaflets. Mobility was not restricted.  Doppler:  Transvalvular velocity was within the normal range. There was no stenosis. There was trivial regurgitation.  ------------------------------------------------------------------- Aorta:  Aortic root: The aortic root was normal in size.  ------------------------------------------------------------------- Mitral valve:   Structurally normal valve.   Mobility was not restricted.  Doppler:  Transvalvular velocity was within the normal range. There was no evidence for stenosis. There was trivial regurgitation.    Valve area by pressure half-time: [name].55 cm^2. Indexed valve area by pressure half-time: 1.33 cm^2/m^2.    Peak gradient (D): [name] mm Hg.  ------------------------------------------------------------------- Left atrium:  The atrium was normal in size.  ------------------------------------------------------------------- Right ventricle:  The cavity size was normal. Wall thickness was normal. Systolic function was normal.  ------------------------------------------------------------------- Pulmonic valve:    Structurally normal valve.   Cusp separation was normal.  Doppler:  Transvalvular velocity was within the normal range. There was no evidence for stenosis. There was no regurgitation.  ------------------------------------------------------------------- Tricuspid valve:   Structurally normal valve.    Doppler: Transvalvular velocity was within the normal range. There was trivial  regurgitation.  ------------------------------------------------------------------- Pulmonary artery:   The main pulmonary artery was normal-sized. Systolic pressure was within the normal range.  ------------------------------------------------------------------- Right atrium:  The atrium was normal in size.  ------------------------------------------------------------------- Pericardium:  There was no pericardial effusion.  ------------------------------------------------------------------- Systemic veins: Inferior vena cava: The vessel was normal in size.  ------------------------------------------------------------------- Measurements  Left ventricle  Value          Reference LV ID, ED, PLAX chordal                43    mm       43 - 52 LV ID, ES, PLAX chordal                27    mm       23 - 38 LV fx shortening, PLAX chordal         37    %        >=29 LV PW thickness, ED                    13    mm       ---------- IVS/LV PW ratio, ED                    1              <=1.[name] Stroke volume, 2D                      88    ml       ---------- Stroke volume/bsa, 2D                  33    ml/m^2   ---------- LV e&', lateral                         17.2  cm/s     ---------- LV E/e&', lateral                       5.12           ---------- LV e&', medial                          9.46  cm/s     ---------- LV E/e&', medial                        9.31           ---------- LV e&', average                         13.33 cm/s     ---------- LV E/e&', average                       6.61           ----------  Ventricular septum                     Value          Reference IVS thickness, ED                      13    mm       ----------  LVOT                                   Value          Reference LVOT ID, S  23    mm       ---------- LVOT area                              4.15  cm^2     ---------- LVOT ID                                 23    mm       ---------- LVOT peak velocity, S                  113   cm/s     ---------- LVOT mean velocity, S                  79.1  cm/s     ---------- LVOT VTI, S                            21.2  cm       ---------- LVOT peak gradient, S                  5     mm Hg    ---------- Stroke volume (SV), LVOT DP            88.1  ml       ---------- Stroke index (SV/bsa), LVOT DP         33    ml/m^2   ----------  Aorta                                  Value          Reference Aortic root ID, ED                     36    mm       ---------- Ascending aorta ID, [name]-P, S             30    mm       ----------  Left atrium                            Value          Reference LA ID, [name]-P, ES                         39    mm       ---------- LA ID/bsa, [name]-P                         1.46  cm/m^2   <=2.2 LA volume, S                           55.2  ml       ---------- LA volume/bsa, S                       20.7  ml/m^2   ---------- LA volume, ES, 1-p A4C                 52.7  ml       ----------  LA volume/bsa, ES, 1-p A4C             19.8  ml/m^2   ---------- LA volume, ES, 1-p A2C                 51.6  ml       ---------- LA volume/bsa, ES, 1-p A2C             19.4  ml/m^2   ----------  Mitral valve                           Value          Reference Mitral E-wave peak velocity            88.1  cm/s     ---------- Mitral [name]-wave peak velocity            59.4  cm/s     ---------- Mitral deceleration time               211   ms       150 - 230 Mitral pressure half-time              62    ms       ---------- Mitral peak gradient, D                [name]     mm Hg    ---------- Mitral E/[name] ratio, peak                 1.5            ---------- Mitral valve area, PHT, DP             [name].55  cm^2     ---------- Mitral valve area/bsa, PHT, DP         1.33  cm^2/m^2 ----------  Right atrium                           Value          Reference RA ID, S-I, ES, A4C                    43.[name]  mm       34 - 49 RA area, ES,  A4C                       12.8  cm^2     8.[name] - 19.5 RA volume, ES, [name]/L                     30.4  ml       ---------- RA volume/bsa, ES, [name]/L                 11.4  ml/m^2   ----------  Systemic veins                         Value          Reference Estimated CVP                          [name]     mm Hg    ----------  Right ventricle  Value          Reference TAPSE                                  22.1  mm       ---------- RV s&', lateral, S                      11.1  cm/s     ----------  Legend: (L)  and  (H)  mark values outside specified reference range.  ------------------------------------------------------------------- Prepared and Electronically Authenticated by  [name], M.D. 2019-11-18T12:54:46      CT SCANS  CT CARDIAC SCORING (SELF PAY ONLY) 03/20/2024  Addendum 03/20/2024  8:58 PM ADDENDUM REPORT: 03/20/2024 20:55  EXAM: OVER-READ INTERPRETATION  CT CHEST  The following report is an over-read performed by radiologist Dr. Andrea Gasman of [facility] on 03/20/2024. This over-read does not include interpretation of cardiac or coronary anatomy or pathology. The coronary calcium score interpretation by the cardiologist is attached.  COMPARISON:  Chest CTA 02/21/2024  FINDINGS: Vascular: No aortic atherosclerosis. The included aorta is normal in caliber.  Mediastinum/nodes: No adenopathy or mass. Unremarkable esophagus.  Lungs: No focal airspace disease. Unchanged 11 mm well-circumscribed left lower lobe pulmonary nodule, series 601, image 58. No pleural fluid. The included airways are patent.  Upper abdomen: No acute or unexpected findings.  Musculoskeletal: There are no acute or suspicious osseous abnormalities.  IMPRESSION: 1. No acute extracardiac findings. 2. Unchanged 11 mm left lower lobe pulmonary nodule. This nodule is unchanged from 11/01/2023 chest CT. Continued CT follow-up, PET or tissue sampling  recommended.   Electronically Signed By: Andrea Gasman M.D. On: 03/20/2024 20:55  Narrative CLINICAL DATA:  Cardiovascular Disease Risk stratification  EXAM: Coronary Calcium Score  TECHNIQUE: [name] gated, non-contrast computed tomography scan of the heart was performed using 2.5 mm slice thickness. Axial images were analyzed on [name] dedicated workstation. Calcium scoring of the coronary arteries was performed using the Agatston method.  FINDINGS: Coronary Calcium Score:  Left main: 0  Left anterior descending artery: 4.33  Left circumflex artery: 0  Right coronary artery: 0  Total: 4.33  Percentile: 91st  Pericardium: Normal.  Ascending Aorta: Normal caliber.  IMPRESSION: 1. Coronary calcium score of 4.33. This was 91st percentile for age-, race-, and sex-matched controls.  2. Non-cardiac: See separate report from [facility].  RECOMMENDATIONS: Coronary artery calcium (CAC) score is [name] strong predictor of incident coronary heart disease (CHD) and provides predictive information beyond traditional risk factors. CAC scoring is reasonable to use in the decision to withhold, postpone, or initiate statin therapy in intermediate-risk or selected borderline-risk asymptomatic adults (age 60-75 years and LDL-C >=70 to <190 mg/dL) who do not have diabetes or established atherosclerotic cardiovascular disease (ASCVD).* In intermediate-risk (10-year ASCVD risk >=7.5% to <20%) adults or selected borderline-risk (10-year ASCVD risk >=5% to <7.5%) adults in whom [name] CAC score is measured for the purpose of making [name] treatment decision the following recommendations have been made:  If CAC=0, it is reasonable to withhold statin therapy and reassess in 5 to 10 years, as long as higher risk conditions are absent (diabetes mellitus, family history of premature CHD in first degree relatives (males <55 years; females <65 years), cigarette smoking, or LDL >=190 mg/dL).  If  CAC is 1 to 99, it is reasonable to initiate statin therapy for patients >=82 years of age.  If CAC is >=100 or >=75th percentile, it is reasonable to initiate statin therapy at any age.  Cardiology referral should be considered for patients with CAC scores >=400 or >=75th percentile.  *2018 AHA/ACC/AACVPR/AAPA/ABC/ACPM/ADA/AGS/APhA/ASPC/NLA/PCNA Guideline on the Management of Blood Cholesterol: [name] Report of the American College of Cardiology/American Heart Association Task Force on Clinical Practice Guidelines. J Am Coll Cardiol. 2019;73(24):3168-3209.  Madonna Large, DO, [facility]  Electronically Signed: By: Madonna Large On: 03/20/2024 19:56     ______________________________________________________________________________________________     Recent Labs: 11/14/2023: ALT 29 02/21/2024: BUN 15; Creatinine, Ser 0.81; Hemoglobin 13.4; Platelets 301; Potassium 4.2; Sodium 138  Recent Lipid Panel    Component Value Date/Time   CHOL 204 (H) 11/14/2023 1024   TRIG 83 11/14/2023 1024   HDL 49 (L) 11/14/2023 1024   CHOLHDL 4.2 11/14/2023 1024   VLDL 23 10/03/2016 1659   LDLCALC 137 (H) 11/14/2023 1024    History of Present Illness   48 year old female with the above past medical history including coronary artery calcification, PE, DVT, hypertension, hyperlipidemia,  Mycobacterium avium intracellulare complex, HIV, arthritis, anxiety, pulmonary nodule, and OSA.   Her echocardiogram in 2019 showed EF 65 to 70%, no RWMA, normal LV systolic function, normal RV systolic function, no significant valvular abnormalities.  She was seen in the ED on 02/21/2024 in the setting of chest pain.  Troponin was negative x 2.  EKG showed normal sinus rhythm, 80 bpm, no acute ST/T wave changes.  D-dimer was slightly elevated.  CT angiogram showed was negative for PE, did show known pulmonary nodule.  She saw her PCP on 03/05/2024 and reported ongoing intermittent nonexertional chest pain.  Coronary calcium  score on 03/20/2024 was 4.33 (91st percentile).  She was referred to cardiology for ongoing management.  She presents today for heart first clinic new patient evaluation.  She has postop PE/DVT following ankle surgery in 2018.  She had knee surgery in July 2025.  She had an ED visit postoperatively in the setting of chest pain.  Workup was unremarkable.  She returned to the ED in 02/2024.  Workup as above.  She has not had any recurrent chest pain.  Shoulder interaction between Genvoya  and atorvastatin, appears this is only at doses greater than 20 mg.  She is pending repeat labs with PCP.  Through shared decision making, and for further risk stratification will pursue coronary CT angiogram, will check echocardiogram.  Will check BMP today.  Her mother is [name] patient of Dr. Shlomo, she is requesting to follow with Dr. Shlomo.  Occasional caffeine, rare social alcohol use, no drug or alcohol use.  She does exercise occasionally, denies exertional symptoms.  She wears an oral appliance for OSA, managed by [facility].  Follows with infectious disease for management of HIV, has previously followed with pulmonology, not actively.  Mother had [name] history of TIA, no other significant family history.  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  She lives alone.  Overall, she reports feeling well.  Follow-up in 6 to 8 weeks after testing.  Consider referral to lipid clinic Pharm.D. if LDL remains elevated above goal given limitations/drug interactions with statins and antiviral therapy.  CC: -Initial onset:  -Frequency/Duration:  -Associated symptoms:  -Aggravating/alleviating factors:  -Syncope/near syncope: -Prior cardiac history: -Prior ECG:  -Prior workup: -Prior treatment:  -Possible medication interactions:  -Caffeine:  -Alcohol:  -Tobacco: -OTC supplements:  -Comorbidities:  -Exercise level:  -Labs:  -Cardiac ROS: no chest pain, no  shortness of breath, no PND, no  orthopnea, no LE edema. She denies any headaches. -Family history:     Home Medications    Current Outpatient Medications  Medication Sig Dispense Refill   atorvastatin (LIPITOR) 10 MG tablet Take 10 mg by mouth daily.     azelastine  (ASTELIN ) 0.1 % nasal spray Place 2 sprays into both nostrils 2 (two) times daily. Use in each nostril as directed 30 mL 12   cetirizine (ZYRTEC) 10 MG tablet Take 10 mg by mouth daily as needed for allergies.     darunavir  (PREZISTA ) 800 MG tablet Take 1 tablet (800 mg total) by mouth daily. 30 tablet 11   elvitegravir-cobicistat-emtricitabine -tenofovir  (GENVOYA ) 150-150-200-10 MG TABS tablet Take 1 tablet by mouth daily. 30 tablet 11   hydrochlorothiazide  (MICROZIDE ) 12.5 MG capsule Take 1 capsule (12.5 mg total) by mouth daily. 30 capsule 0   Vitamin D, Ergocalciferol, 50000 units CAPS Take 1 capsule by mouth once [name] week.     No current facility-administered medications for this visit.     Review of Systems    ***.  All other systems reviewed and are otherwise negative except as noted above.    Physical Exam    VS:  BP 120/82   Pulse 84   Ht 5' 10 (1.778 m)   Wt 277 lb 12.8 oz (126 kg)   LMP 02/28/2019   SpO2 97%   BMI 39.86 kg/m  , BMI Body mass index is 39.86 kg/m.     GEN: Well nourished, well developed, in no acute distress. HEENT: normal. Neck: Supple, no JVD, carotid bruits, or masses. Cardiac: RRR, no murmurs, rubs, or gallops. No clubbing, cyanosis, edema.  Radials/DP/PT 2+ and equal bilaterally.  Respiratory:  Respirations regular and unlabored, clear to auscultation bilaterally. GI: Soft, nontender, nondistended, BS + x 4. MS: no deformity or atrophy. Skin: warm and dry, no rash. Neuro:  Strength and sensation are intact. Psych: Normal affect.  Accessory Clinical Findings    ECG personally reviewed by me today -    - no acute changes.   Lab Results  Component Value Date   WBC 9.[name] 02/21/2024   HGB 13.4 02/21/2024   HCT  42.5 02/21/2024   MCV 88.5 02/21/2024   PLT 301 02/21/2024   Lab Results  Component Value Date   CREATININE 0.81 02/21/2024   BUN 15 02/21/2024   NA 138 02/21/2024   K 4.2 02/21/2024   CL 104 02/21/2024   CO2 26 02/21/2024   Lab Results  Component Value Date   ALT 29 11/14/2023   AST 21 11/14/2023   ALKPHOS 66 03/18/2019   BILITOT 0.[name] 11/14/2023   Lab Results  Component Value Date   CHOL 204 (H) 11/14/2023   HDL 49 (L) 11/14/2023   LDLCALC 137 (H) 11/14/2023   TRIG 83 11/14/2023   CHOLHDL 4.2 11/14/2023    Lab Results  Component Value Date   HGBA1C 5.[name] 04/26/2023    Assessment & Plan    1.  ***      Damien JAYSON Braver, NP 04/03/2024, 9:33 AM       [1] No Known Allergies

## 2024-04-03 NOTE — Patient Instructions (Signed)
 Medication Instructions:  Your physician recommends that you continue on your current medications as directed. Please refer to the Current Medication list given to you today.  *If you need a refill on your cardiac medications before your next appointment, please call your pharmacy*  Lab Work: TODAY:  BMET  If you have labs (blood work) drawn today and your tests are completely normal, you will receive your results only by: MyChart Message (if you have MyChart) OR A paper copy in the mail If you have any lab test that is abnormal or we need to change your treatment, we will call you to review the results.  Testing/Procedures: Your physician has requested that you have an echocardiogram. Echocardiography is a painless test that uses sound waves to create images of your heart. It provides your doctor with information about the size and shape of your heart and how well your hearts chambers and valves are working. This procedure takes approximately one hour. There are no restrictions for this procedure. Please do NOT wear cologne, perfume, aftershave, or lotions (deodorant is allowed). Please arrive 15 minutes prior to your appointment time.  Please note: We ask at that you not bring children with you during ultrasound (echo/ vascular) testing. Due to room size and safety concerns, children are not allowed in the ultrasound rooms during exams. Our front office staff cannot provide observation of children in our lobby area while testing is being conducted. An adult accompanying a patient to their appointment will only be allowed in the ultrasound room at the discretion of the ultrasound technician under special circumstances. We apologize for any inconvenience.   Your physician has requested that you have cardiac CT. Cardiac computed tomography (CT) is a painless test that uses an x-ray machine to take clear, detailed pictures of your heart. For further information please visit [email or web address]  Please follow instruction sheet BELOW:    Your cardiac CT will be scheduled at one of the below locations:   [facility] [address] [place], [place] [address] [phone] (Severe contrast allergies only)  OR   [facility] [address] [place], [place] [address] [phone]  OR   [facility] [address] [place], [place] [address] [phone]  OR   [facility] and Vascular Tower [address]  [place], [place] [address]  OR   [facility] [address] [place], [place] [phone]  If scheduled at [facility], please arrive at the [facility] and Children's Entrance (Entrance C2) of [facility] 30 minutes prior to test start time. You can use the FREE valet parking offered at entrance C (encouraged to control the heart rate for the test)  Proceed to the [facility] Radiology Department (first floor) to check-in and test prep.  All radiology patients and guests should use entrance C2 at [facility], accessed from [facility], even though the hospital's physical address listed is [address].  If scheduled at the Heart and Vascular Tower at [address], please enter the parking lot using the [address] entrance and use the FREE valet service at the patient drop-off area. Enter the building and check-in with registration on the main floor.  If scheduled at [facility], please arrive to the Heart and Vascular Center 15 mins early for check-in and test prep.  There is spacious parking and easy access to the radiology department from the [facility] Heart and Vascular entrance.  Please enter here and check-in with the desk attendant.   If scheduled at [facility], please arrive 30 minutes early for check-in and test prep.  Please follow these instructions carefully (unless otherwise directed):  An IV will be required  for this test and Nitroglycerin will be given.    On the Night Before the Test: Be sure to Drink plenty of water . Do not consume any caffeinated/decaffeinated beverages or chocolate 12 hours prior to your test. Do not take any antihistamines 12 hours prior to your test.    On the Day of the Test: Drink plenty of water  until 1 hour prior to the test. Do not eat any food 1 hour prior to test. You may take your regular medications prior to the test.  Take metoprolol  (Lopressor ) 100 MG two hours prior to test. THIS HAS BEEN SENT TO [facility] Hydrochlorothiazide  Patients who wear a continuous glucose monitor MUST remove the device prior to scanning. FEMALES- please wear underwire-free bra if available, avoid dresses & tight clothing       After the Test: Drink plenty of water . After receiving IV contrast, you may experience a mild flushed feeling. This is normal. On occasion, you may experience a mild rash up to 24 hours after the test. This is not dangerous. If this occurs, you can take Benadryl  25 mg, Zyrtec, Claritin , or Allegra and increase your fluid intake. (Patients taking Tikosyn should avoid Benadryl , and may take Zyrtec, Claritin , or Allegra) If you experience trouble breathing, this can be serious. If it is severe call 911 IMMEDIATELY. If it is mild, please call our office.  We will call to schedule your test 2-4 weeks out understanding that some insurance companies will need an authorization prior to the service being performed.   For more information and frequently asked questions, please visit our website : [email or web address]  For non-scheduling related questions, please contact the cardiac imaging nurse navigator should you have any questions/concerns: Cardiac Imaging Nurse Navigators Direct Office Dial: [phone]   For scheduling needs, including cancellations and rescheduling, please call Brittany, [phone].  Follow-Up: At [facility], you and your health needs are our priority.  As part of our continuing mission to provide you with exceptional heart care, our providers are all part of one team.  This team includes your primary Cardiologist (physician) and Advanced Practice Providers or APPs (Physician Assistants and Nurse Practitioners) who all work together to provide you with the care you need, when you need it.  Your next appointment:   6-8 week(s)  Provider:   Lonni Cash, MD    We recommend signing up for the patient portal called MyChart.  Sign up information is provided on this After Visit Summary.  MyChart is used to connect with patients for Virtual Visits (Telemedicine).  Patients are able to view lab/test results, encounter notes, upcoming appointments, etc.  Non-urgent messages can be sent to your provider as well.   To learn more about what you can do with MyChart, go to [email or web address].   Other Instructions

## 2024-04-07 ENCOUNTER — Ambulatory Visit: Payer: Self-pay | Admitting: Nurse Practitioner

## 2024-04-07 ENCOUNTER — Encounter: Payer: Self-pay | Admitting: Nurse Practitioner

## 2024-04-14 ENCOUNTER — Encounter (HOSPITAL_COMMUNITY): Payer: Self-pay

## 2024-04-16 ENCOUNTER — Ambulatory Visit (HOSPITAL_COMMUNITY)
Admission: RE | Admit: 2024-04-16 | Discharge: 2024-04-16 | Disposition: A | Discharge status: Home / Self Care | Source: Ambulatory Visit | Attending: Student in an Organized Health Care Education/Training Program | Admitting: Student in an Organized Health Care Education/Training Program

## 2024-04-16 ENCOUNTER — Encounter (HOSPITAL_COMMUNITY)
Admission: RE | Admit: 2024-04-16 | Discharge: 2024-04-16 | Disposition: A | Source: Ambulatory Visit | Attending: Pulmonary Disease

## 2024-04-16 DIAGNOSIS — R911 Solitary pulmonary nodule: Secondary | ICD-10-CM | POA: Insufficient documentation

## 2024-04-16 DIAGNOSIS — R072 Precordial pain: Secondary | ICD-10-CM | POA: Diagnosis not present

## 2024-04-16 DIAGNOSIS — I2510 Atherosclerotic heart disease of native coronary artery without angina pectoris: Secondary | ICD-10-CM | POA: Diagnosis not present

## 2024-04-16 LAB — GLUCOSE, CAPILLARY: Glucose-Capillary: 88 mg/dL (ref 70–99)

## 2024-04-16 MED ORDER — NITROGLYCERIN 0.4 MG SL SUBL
0.8000 mg | SUBLINGUAL_TABLET | Freq: Once | SUBLINGUAL | Status: AC
  Administered 2024-04-16: 0.8 mg via SUBLINGUAL

## 2024-04-16 MED ORDER — IOHEXOL 350 MG/ML SOLN
100.0000 mL | Freq: Once | INTRAVENOUS | Status: AC | PRN
  Administered 2024-04-16: 100 mL via INTRAVENOUS

## 2024-04-16 MED ORDER — FLUDEOXYGLUCOSE F - 18 (FDG) INJECTION
13.8500 | Freq: Once | INTRAVENOUS | Status: AC | PRN
  Administered 2024-04-16: 13.78 via INTRAVENOUS

## 2024-04-23 ENCOUNTER — Other Ambulatory Visit: Payer: Self-pay

## 2024-04-23 DIAGNOSIS — Z79899 Other long term (current) drug therapy: Secondary | ICD-10-CM

## 2024-04-23 DIAGNOSIS — E785 Hyperlipidemia, unspecified: Secondary | ICD-10-CM

## 2024-05-15 ENCOUNTER — Ambulatory Visit (HOSPITAL_COMMUNITY)
Admission: RE | Admit: 2024-05-15 | Discharge: 2024-05-15 | Disposition: A | Discharge status: Home / Self Care | Source: Ambulatory Visit | Attending: Nurse Practitioner | Admitting: Nurse Practitioner

## 2024-05-15 DIAGNOSIS — I2510 Atherosclerotic heart disease of native coronary artery without angina pectoris: Secondary | ICD-10-CM | POA: Insufficient documentation

## 2024-05-15 DIAGNOSIS — R072 Precordial pain: Secondary | ICD-10-CM | POA: Diagnosis present

## 2024-05-15 LAB — HEPATIC FUNCTION PANEL
ALT: 26 [IU]/L (ref 0–32)
AST: 21 [IU]/L (ref 0–40)
Albumin: 4.2 g/dL (ref 3.9–4.9)
Alkaline Phosphatase: 107 [IU]/L (ref 41–116)
Bilirubin Total: 0.3 mg/dL (ref 0.0–1.2)
Bilirubin, Direct: 0.11 mg/dL (ref 0.00–0.40)
Total Protein: 6.7 g/dL (ref 6.0–8.5)

## 2024-05-15 LAB — ECHOCARDIOGRAM COMPLETE
Area-P 1/2: 5.66 cm2
S' Lateral: 2.6 cm

## 2024-05-15 LAB — LIPID PANEL
Chol/HDL Ratio: 3.2 ratio (ref 0.0–4.4)
Cholesterol, Total: 173 mg/dL (ref 100–199)
HDL: 54 mg/dL
LDL Chol Calc (NIH): 98 mg/dL (ref 0–99)
Triglycerides: 117 mg/dL (ref 0–149)
VLDL Cholesterol Cal: 21 mg/dL (ref 5–40)

## 2024-05-16 ENCOUNTER — Ambulatory Visit: Attending: Cardiovascular Disease | Admitting: Cardiovascular Disease

## 2024-05-16 VITALS — BP 102/62 | HR 92 | Ht 70.0 in | Wt 278.0 lb

## 2024-05-16 DIAGNOSIS — I2510 Atherosclerotic heart disease of native coronary artery without angina pectoris: Secondary | ICD-10-CM

## 2024-05-16 NOTE — Patient Instructions (Signed)

## 2024-05-19 ENCOUNTER — Ambulatory Visit: Payer: Self-pay | Admitting: Nurse Practitioner

## 2024-05-19 DIAGNOSIS — E785 Hyperlipidemia, unspecified: Secondary | ICD-10-CM

## 2024-05-21 ENCOUNTER — Other Ambulatory Visit: Payer: Self-pay

## 2024-05-21 ENCOUNTER — Other Ambulatory Visit

## 2024-05-21 DIAGNOSIS — B20 Human immunodeficiency virus [HIV] disease: Secondary | ICD-10-CM

## 2024-05-21 DIAGNOSIS — Z113 Encounter for screening for infections with a predominantly sexual mode of transmission: Secondary | ICD-10-CM

## 2024-05-22 ENCOUNTER — Other Ambulatory Visit

## 2024-05-22 ENCOUNTER — Other Ambulatory Visit: Payer: Self-pay

## 2024-05-22 DIAGNOSIS — Z113 Encounter for screening for infections with a predominantly sexual mode of transmission: Secondary | ICD-10-CM

## 2024-05-22 DIAGNOSIS — B20 Human immunodeficiency virus [HIV] disease: Secondary | ICD-10-CM

## 2024-05-22 DIAGNOSIS — Z79899 Other long term (current) drug therapy: Secondary | ICD-10-CM

## 2024-05-23 LAB — COMPREHENSIVE METABOLIC PANEL WITH GFR
AG Ratio: 1.4 (calc) (ref 1.0–2.5)
ALT: 16 U/L (ref 6–29)
AST: 15 U/L (ref 10–35)
Albumin: 3.9 g/dL (ref 3.6–5.1)
Alkaline phosphatase (APISO): 108 U/L (ref 31–125)
BUN: 13 mg/dL (ref 7–25)
CO2: 33 mmol/L — ABNORMAL HIGH (ref 20–32)
Calcium: 9.2 mg/dL (ref 8.6–10.2)
Chloride: 104 mmol/L (ref 98–110)
Creat: 0.76 mg/dL (ref 0.50–0.99)
Globulin: 2.8 g/dL (ref 1.9–3.7)
Glucose, Bld: 95 mg/dL (ref 65–99)
Potassium: 4 mmol/L (ref 3.5–5.3)
Sodium: 141 mmol/L (ref 135–146)
Total Bilirubin: 0.3 mg/dL (ref 0.2–1.2)
Total Protein: 6.7 g/dL (ref 6.1–8.1)
eGFR: 97 mL/min/{1.73_m2}

## 2024-05-23 LAB — C. TRACHOMATIS/N. GONORRHOEAE RNA
C. trachomatis RNA, TMA: NOT DETECTED
N. gonorrhoeae RNA, TMA: NOT DETECTED

## 2024-05-23 LAB — SYPHILIS: RPR W/REFLEX TO RPR TITER AND TREPONEMAL ANTIBODIES, TRADITIONAL SCREENING AND DIAGNOSIS ALGORITHM: RPR Ser Ql: NONREACTIVE

## 2024-06-05 ENCOUNTER — Ambulatory Visit: Admitting: Infectious Diseases

## 2024-07-10 ENCOUNTER — Ambulatory Visit: Admitting: Pharmacist
